# Patient Record
Sex: Female | Born: 1985 | Race: White | Hispanic: No | Marital: Married | Smoking: Former smoker
Health system: Southern US, Community
[De-identification: ages and names within clinical notes are randomized; demographics above are authoritative.]

## PROBLEM LIST (undated history)

## (undated) DIAGNOSIS — M5136 Other intervertebral disc degeneration, lumbar region: Secondary | ICD-10-CM

## (undated) DIAGNOSIS — M51369 Other intervertebral disc degeneration, lumbar region without mention of lumbar back pain or lower extremity pain: Secondary | ICD-10-CM

## (undated) DIAGNOSIS — G43909 Migraine, unspecified, not intractable, without status migrainosus: Secondary | ICD-10-CM

## (undated) DIAGNOSIS — M5030 Other cervical disc degeneration, unspecified cervical region: Secondary | ICD-10-CM

## (undated) DIAGNOSIS — M5020 Other cervical disc displacement, unspecified cervical region: Secondary | ICD-10-CM

## (undated) HISTORY — DX: Other cervical disc degeneration, unspecified cervical region: M50.30

## (undated) HISTORY — DX: Other cervical disc displacement, unspecified cervical region: M50.20

## (undated) HISTORY — DX: Other intervertebral disc degeneration, lumbar region: M51.36

## (undated) HISTORY — PX: APPENDECTOMY: SHX54

## (undated) HISTORY — DX: Other intervertebral disc degeneration, lumbar region without mention of lumbar back pain or lower extremity pain: M51.369

## (undated) HISTORY — PX: TONSILLECTOMY: SUR1361

## (undated) HISTORY — PX: TUBAL LIGATION: SHX77

## (undated) HISTORY — PX: TYMPANOSTOMY TUBE PLACEMENT: SHX32

## (undated) HISTORY — PX: OTHER SURGICAL HISTORY: SHX169

## (undated) HISTORY — PX: TONSILLECTOMY AND ADENOIDECTOMY: SHX28

---

## 2014-09-20 LAB — HM PAP SMEAR: HM PAP: NEGATIVE

## 2015-06-19 ENCOUNTER — Encounter (HOSPITAL_COMMUNITY): Payer: Self-pay | Admitting: Emergency Medicine

## 2015-06-19 ENCOUNTER — Emergency Department (HOSPITAL_COMMUNITY)
Admission: EM | Admit: 2015-06-19 | Discharge: 2015-06-19 | Disposition: A | Discharge status: Home / Self Care | Payer: BLUE CROSS/BLUE SHIELD | Attending: Emergency Medicine | Admitting: Emergency Medicine

## 2015-06-19 DIAGNOSIS — Z9049 Acquired absence of other specified parts of digestive tract: Secondary | ICD-10-CM | POA: Diagnosis not present

## 2015-06-19 DIAGNOSIS — R1012 Left upper quadrant pain: Secondary | ICD-10-CM | POA: Insufficient documentation

## 2015-06-19 DIAGNOSIS — Z720 Tobacco use: Secondary | ICD-10-CM | POA: Diagnosis not present

## 2015-06-19 DIAGNOSIS — Z3202 Encounter for pregnancy test, result negative: Secondary | ICD-10-CM | POA: Insufficient documentation

## 2015-06-19 LAB — URINALYSIS, ROUTINE W REFLEX MICROSCOPIC
GLUCOSE, UA: NEGATIVE mg/dL
Ketones, ur: NEGATIVE mg/dL
Nitrite: NEGATIVE
PH: 6.5 (ref 5.0–8.0)
Protein, ur: NEGATIVE mg/dL
Specific Gravity, Urine: 1.035 — ABNORMAL HIGH (ref 1.005–1.030)
UROBILINOGEN UA: 1 mg/dL (ref 0.0–1.0)

## 2015-06-19 LAB — COMPREHENSIVE METABOLIC PANEL
ALT: 21 U/L (ref 14–54)
ANION GAP: 9 (ref 5–15)
AST: 22 U/L (ref 15–41)
Albumin: 4.6 g/dL (ref 3.5–5.0)
Alkaline Phosphatase: 60 U/L (ref 38–126)
BILIRUBIN TOTAL: 0.4 mg/dL (ref 0.3–1.2)
BUN: 10 mg/dL (ref 6–20)
CALCIUM: 9.4 mg/dL (ref 8.9–10.3)
CHLORIDE: 102 mmol/L (ref 101–111)
CO2: 26 mmol/L (ref 22–32)
Creatinine, Ser: 0.72 mg/dL (ref 0.44–1.00)
GFR calc non Af Amer: >60 mL/min (ref >60)
GLUCOSE: 93 mg/dL (ref 65–99)
Potassium: 3.9 mmol/L (ref 3.5–5.1)
Sodium: 137 mmol/L (ref 135–145)
Total Protein: 7.6 g/dL (ref 6.5–8.1)

## 2015-06-19 LAB — URINE MICROSCOPIC-ADD ON

## 2015-06-19 LAB — CBC
HCT: 39.4 % (ref 36.0–46.0)
Hemoglobin: 13.1 g/dL (ref 12.0–15.0)
MCH: 29.8 pg (ref 26.0–34.0)
MCHC: 33.2 g/dL (ref 30.0–36.0)
MCV: 89.7 fL (ref 78.0–100.0)
Platelets: 268 10*3/uL (ref 150–400)
RBC: 4.39 MIL/uL (ref 3.87–5.11)
RDW: 12.6 % (ref 11.5–15.5)
WBC: 6.2 10*3/uL (ref 4.0–10.5)

## 2015-06-19 LAB — PREGNANCY, URINE: Preg Test, Ur: NEGATIVE

## 2015-06-19 LAB — LIPASE, BLOOD: Lipase: 21 U/L — ABNORMAL LOW (ref 22–51)

## 2015-06-19 NOTE — ED Notes (Signed)
Pt reports upper left abd pain for the last month but worse this week. Pt has loss of appetite and reports being full all the time. Pt states reports her period cycle has been off rhythm.

## 2015-06-19 NOTE — Discharge Instructions (Signed)
Abdominal Pain, Women °Abdominal (stomach, pelvic, or belly) pain can be caused by many things. It is important to tell your doctor: °· The location of the pain. °· Does it come and go or is it present all the time? °· Are there things that start the pain (eating certain foods, exercise)? °· Are there other symptoms associated with the pain (fever, nausea, vomiting, diarrhea)? °All of this is helpful to know when trying to find the cause of the pain. °CAUSES  °· Stomach: virus or bacteria infection, or ulcer. °· Intestine: appendicitis (inflamed appendix), regional ileitis (Crohn's disease), ulcerative colitis (inflamed colon), irritable bowel syndrome, diverticulitis (inflamed diverticulum of the colon), or cancer of the stomach or intestine. °· Gallbladder disease or stones in the gallbladder. °· Kidney disease, kidney stones, or infection. °· Pancreas infection or cancer. °· Fibromyalgia (pain disorder). °· Diseases of the female organs: °¨ Uterus: fibroid (non-cancerous) tumors or infection. °¨ Fallopian tubes: infection or tubal pregnancy. °¨ Ovary: cysts or tumors. °¨ Pelvic adhesions (scar tissue). °¨ Endometriosis (uterus lining tissue growing in the pelvis and on the pelvic organs). °¨ Pelvic congestion syndrome (female organs filling up with blood just before the menstrual period). °¨ Pain with the menstrual period. °¨ Pain with ovulation (producing an egg). °¨ Pain with an IUD (intrauterine device, birth control) in the uterus. °¨ Cancer of the female organs. °· Functional pain (pain not caused by a disease, may improve without treatment). °· Psychological pain. °· Depression. °DIAGNOSIS  °Your doctor will decide the seriousness of your pain by doing an examination. °· Blood tests. °· X-rays. °· Ultrasound. °· CT scan (computed tomography, special type of X-ray). °· MRI (magnetic resonance imaging). °· Cultures, for infection. °· Barium enema (dye inserted in the large intestine, to better view it with  X-rays). °· Colonoscopy (looking in intestine with a lighted tube). °· Laparoscopy (minor surgery, looking in abdomen with a lighted tube). °· Major abdominal exploratory surgery (looking in abdomen with a large incision). °TREATMENT  °The treatment will depend on the cause of the pain.  °· Many cases can be observed and treated at home. °· Over-the-counter medicines recommended by your caregiver. °· Prescription medicine. °· Antibiotics, for infection. °· Birth control pills, for painful periods or for ovulation pain. °· Hormone treatment, for endometriosis. °· Nerve blocking injections. °· Physical therapy. °· Antidepressants. °· Counseling with a psychologist or psychiatrist. °· Minor or major surgery. °HOME CARE INSTRUCTIONS  °· Do not take laxatives, unless directed by your caregiver. °· Take over-the-counter pain medicine only if ordered by your caregiver. Do not take aspirin because it can cause an upset stomach or bleeding. °· Try a clear liquid diet (broth or water) as ordered by your caregiver. Slowly move to a bland diet, as tolerated, if the pain is related to the stomach or intestine. °· Have a thermometer and take your temperature several times a day, and record it. °· Bed rest and sleep, if it helps the pain. °· Avoid sexual intercourse, if it causes pain. °· Avoid stressful situations. °· Keep your follow-up appointments and tests, as your caregiver orders. °· If the pain does not go away with medicine or surgery, you may try: °¨ Acupuncture. °¨ Relaxation exercises (yoga, meditation). °¨ Group therapy. °¨ Counseling. °SEEK MEDICAL CARE IF:  °· You notice certain foods cause stomach pain. °· Your home care treatment is not helping your pain. °· You need stronger pain medicine. °· You want your IUD removed. °· You feel faint or   lightheaded. °· You develop nausea and vomiting. °· You develop a rash. °· You are having side effects or an allergy to your medicine. °SEEK IMMEDIATE MEDICAL CARE IF:  °· Your  pain does not go away or gets worse. °· You have a fever. °· Your pain is felt only in portions of the abdomen. The right side could possibly be appendicitis. The left lower portion of the abdomen could be colitis or diverticulitis. °· You are passing blood in your stools (bright red or black tarry stools, with or without vomiting). °· You have blood in your urine. °· You develop chills, with or without a fever. °· You pass out. °MAKE SURE YOU:  °· Understand these instructions. °· Will watch your condition. °· Will get help right away if you are not doing well or get worse. °Document Released: 09/21/2007 Document Revised: 04/10/2014 Document Reviewed: 10/11/2009 °ExitCare® Patient Information ©2015 [employer]. This information is not intended to replace advice given to you by your health care provider. Make sure you discuss any questions you have with your health care provider. ° °Emergency Department Resource Guide °1) Find a Doctor and Pay Out of Pocket °Although you won't have to find out who is covered by your insurance plan, it is a good idea to ask around and get recommendations. You will then need to call the office and see if the doctor you have chosen will accept you as a new patient and what types of options they offer for patients who are self-pay. Some doctors offer discounts or will set up payment plans for their patients who do not have insurance, but you will need to ask so you aren't surprised when you get to your appointment. ° °2) Contact Your Local Health Department °Not all health departments have doctors that can see patients for sick visits, but many do, so it is worth a call to see if yours does. If you don't know where your local health department is, you can check in your phone book. The CDC also has a tool to help you locate your state's health department, and many state websites also have listings of all of their local health departments. ° °3) Find a Walk-in Clinic °If your illness is  not likely to be very severe or complicated, you may want to try a walk in clinic. These are popping up all over the country in pharmacies, drugstores, and shopping centers. They're usually staffed by nurse practitioners or physician assistants that have been trained to treat common illnesses and complaints. They're usually fairly quick and inexpensive. However, if you have serious medical issues or chronic medical problems, these are probably not your best option. ° °No Primary Care Doctor: °- Call [facility] at  832-8000 - they can help you locate a primary care doctor that  accepts your insurance, provides certain services, etc. °- Physician Referral Service- 1-800-533-3463 ° °Chronic Pain Problems: °Organization         Address  Phone   Notes  °[facility]  (336) 297-2271 Patients need to be referred by their primary care doctor.  ° °Medication Assistance: °Organization         Address  Phone   Notes  °[place] [facility] [address] °[place], [place] [address] (336) 641-8030 --Must be a resident of [place] °-- Must have NO insurance coverage whatsoever (no Medicaid/ Medicare, etc.) °-- The pt. MUST have a primary care doctor that directs their care regularly   and follows them in the community °  °[facility]  (866) 331-1348   °[employer]  (888) 892-1162   ° °Agencies that provide inexpensive medical care: °Organization         Address  Phone   Notes  °[facility]  (336) 832-8035   °[facility]    (336) 832-7272   °[facility] [address] °[place], [place] [address] (336) 832-4777   °[facility] [address], °[place] (336) 271-4999   °[facility]    (336) 373-0678   °[facility]    (336) 272-1050   °[facility] ° [address], [place] Phone:  (336) 832-4444, Fax:  (336) 832-4440 Hours of Operation:  9 am - 6  pm, M-F.  Also accepts Medicaid/Medicare and self-pay.  ° [facility] ° [address], [place] Phone: (336) 832-3150, Fax: (336) 832-3151. Hours of Operation:  8:30 am - 5:30 pm, M-F.  Also accepts Medicaid and self-pay.  °[facility] [place] [address], [place] Phone: (336) 878-6027   °[facility] [address], [place], [place] (336)723-1848, Ext. 123 Mondays & Thursdays: 7-9 AM.  First 15 patients are seen on a first come, first serve basis. °  ° °Medicaid-accepting [place] Providers: ° °Organization         Address  Phone   Notes  °[facility] [address], [place] (336) 641-2100 Also accepts self-pay patients.  °[facility] [address], [place] ° (336) 856-9996   °[facility] [address], [place] (336) 288-8857   °[facility] [address] [place] [address], [place] (336) 299-7000   °Veita Bland [address], [place]  ° (336) 373-1557 Only accepts [place] Access Medicaid patients after they have their name applied to their card.  ° °Self-Pay (no insurance) in [place]: ° °Organization         Address  Phone   Notes  °Sickle Cell Patients, [facility] [address], [place] (336) 832-1970   °[facility] [address], [place] (336) 832-4400   °[facility] ° [address], [place] (336) 992-4800   °[facility]/Dr. Osei-Bonsu ° [address] [place] [address], [place] or [address], [place] (336) 841-8500 Phone number for both [place] and [place] locations is the same.  °[facility] [address], [place] (336) 299-0000   °[facility] [address] [place] [address], [place] or [address] (336) 852-7530 °(336) 878-2260   °[facility] [address], [place] (336) 350-1642, phone; (336) 294-5005, fax Sees patients 1st and 3rd Saturday of every month.  Must not qualify for public or private insurance (i.e. Medicaid, Medicare, [place] Health Choice, Veterans' Benefits) • Household income should be no more than 200% of the poverty level •The clinic cannot treat you if you are pregnant or think you are pregnant • Sexually transmitted diseases are not treated at the clinic.  ° ° °Dental Care: °Organization         Address  Phone  Notes  °[facility] [address], [place] (336) 641-6152 Accepts children up to age 21 who   are enrolled in Medicaid or [place] Health Choice; pregnant women with a Medicaid card; and children who have applied for Medicaid or [place] Health Choice, but were declined, whose parents can pay a reduced fee at time of service.  °[facility] [place]  [address], [place] (336) 641-7733 Accepts children up to age 21 who are enrolled in Medicaid or [place] Health Choice; pregnant women with a Medicaid card; and children who have applied for Medicaid or [place] Health Choice, but were declined, whose parents can pay a reduced fee at time of service.  °[facility] ° [address], [place] (336) 641-4533 Patients are seen by appointment only. Walk-ins are not accepted. [facility] will see patients 18 years of age and older. °Monday - Tuesday (8am-5pm) °Most Wednesdays (8:30-5pm) °$30 per visit, cash only  °[facility] ° [address], [place] (336) 641-4533 Patients are seen by appointment only. Walk-ins are not accepted. [facility] will see patients 18 years of age and older. °One Wednesday Evening (Monthly: Volunteer Based).  $30 per visit, cash only  °[facility]  (919) 537-3737 for adults; Children under age 4, call Graduate Pediatric Dentistry at (919)  [phone]. Children aged 4-14, please call (919) [phone] to request a pediatric application. ° Dental services are provided in all areas of dental care including fillings, crowns and bridges, complete and partial dentures, implants, gum treatment, root canals, and extractions. Preventive care is also provided. Treatment is provided to both adults and children. °Patients are selected via a lottery and there is often a waiting list. °  °[facility] [address], °[place] ° (336) 763-8833 [email or web address] °  °[facility] [address], [place], [place] (336)723-1848, Ext. 123 Second and Fourth Thursday of each month, opens at 6:30 AM; Clinic ends at 9 AM.  Patients are seen on a first-come first-served basis, and a limited number are seen during each clinic.  ° °[facility] ° [address], [place], [place] (336) 723-7904   Eligibility Requirements °You must have lived in [place], [place], or [place] counties for at least the last three months. °  You cannot be eligible for state or federal sponsored healthcare insurance, including Veterans Administration, Medicaid, or Medicare. °  You generally cannot be eligible for healthcare insurance through your employer.  °  How to apply: °Eligibility screenings are held every Tuesday and Wednesday afternoon from 1:00 pm until 4:00 pm. You do not need an appointment for the interview!  °[facility] [address], [place], [place] 336-631-2330   °[facility] [facility]  336-342-8273   °[facility]  336-703-3100   °[facility]  336-570-6415   ° °Behavioral Health Resources in the Community: °Intensive Outpatient Programs °Organization         Address  Phone  Notes  °[facility] [address], [place], [place] 336-878-6098   °[facility] [address], [place], [place] 336-832-9800   °[facility]  [address], [place], [place] ° 336-882-2125   °[facility] [address],  °[place], [place] 1-800-853-5163 or 336-641-4981   °Substance Abuse Resources °Organization         Address  Phone  Notes  °[facility]  336-882-2125   °[facility]  336-784-9470   °  [facility]  336-285-9073   °[facility]  336-845-3988   °Residential & Outpatient Substance Abuse Program  1-800-659-3381   °Psychological Services °Organization         Address  Phone  Notes  °[facility]  336- 832-9600   °[facility]  336- 378-7881   °[facility] [address], [place] 1-800-853-5163 or 336-641-4981   ° °Mobile Crisis Teams °Organization         Address  Phone  Notes  °[facility], Mobile Crisis Care Unit  1-877-626-1772   °Assertive °[facility] ° [address] [place], [place] 336-834-9664   °Sharon DeEsch [address] °[place] 336-554-5454   ° °Self-Help/Support Groups °Organization         Address  Phone             Notes  °[facility] - variety of support groups  336- 373-1402 Call for more information  °Narcotics Anonymous (NA), [facility] [address], °[place]  2 meetings at this location  ° °Residential Treatment Programs °Organization         Address  Phone  Notes  °[facility] [address],    °[place]  1-866-801-8205   °[facility] ° [address], [place], [place] 704-293-8524   °[facility] [address], [place] 336-845-3988 Admissions: 8am-3pm M-F  °[facility] [address]    °[place], [place] 336-841-1104   °[facility] [address], [place], [place] 336-379-7146   °[facility] [address]  °[place], [place] 336-285-9073   °[facility] - Intensive Outpatient [address], [place], [place]  336-852-3033   °[facility] [address]  °[place], [place] 1-877-615-2722 or 336-784-9470   °[facility] [address] [place], [place] 336-227-7417 Accepts Medicaid  °[facility] [address]  ° [place] 1-800-659-3381 Substance Abuse/Addiction Treatment  ° °[place] Behavioral Health Resources °Organization         Address  Phone  Notes  °[facility]  (888) 581-9988   °Julie Brannon, PhD [address] [place], [place]   (336) 349-5553 or (336) 951-0000   °[facility]   [address] °[place], [place] (336) 349-4454   °[facility] [address], [place], [place] (336) 342-8316 Insurance/Medicaid/sponsorship through [facility]  °[facility] [address]                                    [place], [place] (336) 342-8316 Therapy/tele-psych/case  °[facility] [address]  ° [place], [place] (336) 349-2233    °Dr. Arfeen  (336) 349-4544   °[facility] [place]  [employer] [place] [facility] 1) [address], [place] °2) [address], [place] °3)  [address], [place] (336) 349-3220 °(336) 342-7768 ° °(336) 342-8140   °[place] Child Abuse [facility] (336) 342-1394 or (336) 342-3537 (After Hours)    ° ° °

## 2015-06-19 NOTE — ED Notes (Signed)
Pt states that x 6 months she has had LUQ pain and decreased appetite. Has been seen at [facility] w/o finding out what the prob is. Alert and oriented.

## 2015-06-19 NOTE — ED Provider Notes (Signed)
CSN: [id number]     Arrival date & time 06/19/15  1845 History   First MD Initiated Contact with Patient 06/19/15 2056     Chief Complaint  Patient presents with  . Abdominal Pain     (Consider location/radiation/quality/duration/timing/severity/associated sxs/prior Treatment) HPI Comments: Pt comes in with c/o luq abdominal pain that has been going on for 1 month.denies fever, vomiting or diarrhea. Pt states that she went to [facility] and they didn't find anything. She states that the symptoms have gotten worse int he last week. She state that her period has not been coming consistently like in the past. Sh is currently on her menstrual cycle. She has not had a cough. She states that she doesn't have an appetite. she states that her pcp made an appointment with gi but she missed it and they couldn't see her again for 2 months.  The history is provided by the patient. No language interpreter was used.    History reviewed. No pertinent past medical history. Past Surgical History  Procedure Laterality Date  . Bulging disc      cervical  . Appendectomy    . Tympanostomy tube placement     History reviewed. No pertinent family history. History  Substance Use Topics  . Smoking status: Current Every Day Smoker  . Smokeless tobacco: Not on file  . Alcohol Use: Yes     Comment: social   OB History    No data available     Review of Systems  All other systems reviewed and are negative.     Allergies  Review of patient's allergies indicates no known allergies.  Home Medications   Prior to Admission medications   Medication Sig Start Date End Date Taking? Authorizing Provider  acetaminophen (TYLENOL) 500 MG tablet Take 1,000 mg by mouth every 6 (six) hours as needed for moderate pain.   Yes Historical Provider, MD  ibuprofen (ADVIL,MOTRIN) 200 MG tablet Take 800 mg by mouth every 6 (six) hours as needed for moderate pain.   Yes Historical Provider, MD   Ibuprofen-Diphenhydramine HCl (ADVIL PM) 200-25 MG CAPS Take 1 tablet by mouth at bedtime as needed (sleep).   Yes Historical Provider, MD  naproxen sodium (ANAPROX) 220 MG tablet Take 220 mg by mouth 2 (two) times daily as needed (pain).   Yes Historical Provider, MD   BP 126/79 mmHg  Pulse 80  Temp(Src) 98.3 F (36.8 C) (Oral)  SpO2 100%  LMP 06/18/2015 Physical Exam  Constitutional: She appears well-developed and well-nourished.  HENT:  Head: Normocephalic and atraumatic.  Cardiovascular: Normal rate and regular rhythm.   Pulmonary/Chest: Effort normal and breath sounds normal.  Abdominal: Soft. Bowel sounds are normal. There is no tenderness.  Musculoskeletal: Normal range of motion.  Neurological: She is alert. She exhibits normal muscle tone. Coordination normal.  Skin: Skin is warm and dry.  Psychiatric: She has a normal mood and affect.  Nursing note and vitals reviewed.   ED Course  Procedures (including critical care time) Labs Review Labs Reviewed  LIPASE, BLOOD - Abnormal; Notable for the following:    Lipase 21 (*)    All other components within normal limits  URINALYSIS, ROUTINE W REFLEX MICROSCOPIC (NOT AT [facility]) - Abnormal; Notable for the following:    Color, Urine AMBER (*)    APPearance CLOUDY (*)    Specific Gravity, Urine 1.035 (*)    Hgb urine dipstick LARGE (*)    Bilirubin Urine SMALL (*)    Leukocytes,  UA TRACE (*)    All other components within normal limits  COMPREHENSIVE METABOLIC PANEL  CBC  PREGNANCY, URINE  URINE MICROSCOPIC-ADD ON    Imaging Review No results found.   EKG Interpretation None      MDM   Final diagnoses:  Left upper quadrant pain   Abdomen no acute on exam. With symptoms going on for 6 months. Think that pt is okay to follow up with gi.. Hematuria noted but pt is on period at this time    Teressa LowerVrinda Trenden Hazelrigg, NP 06/19/15 [id number]  Benjiman CoreNathan Fernando Stoiber, MD 06/20/15 [phone]

## 2015-06-29 ENCOUNTER — Encounter (HOSPITAL_COMMUNITY): Payer: Self-pay

## 2015-06-29 ENCOUNTER — Emergency Department (HOSPITAL_COMMUNITY)
Admission: EM | Admit: 2015-06-29 | Discharge: 2015-06-29 | Disposition: A | Discharge status: Home / Self Care | Payer: BLUE CROSS/BLUE SHIELD | Attending: Emergency Medicine | Admitting: Emergency Medicine

## 2015-06-29 DIAGNOSIS — R1013 Epigastric pain: Secondary | ICD-10-CM | POA: Diagnosis not present

## 2015-06-29 DIAGNOSIS — Z9889 Other specified postprocedural states: Secondary | ICD-10-CM | POA: Insufficient documentation

## 2015-06-29 DIAGNOSIS — R110 Nausea: Secondary | ICD-10-CM | POA: Diagnosis not present

## 2015-06-29 DIAGNOSIS — Z79899 Other long term (current) drug therapy: Secondary | ICD-10-CM | POA: Diagnosis not present

## 2015-06-29 DIAGNOSIS — R1012 Left upper quadrant pain: Secondary | ICD-10-CM | POA: Diagnosis not present

## 2015-06-29 DIAGNOSIS — G8929 Other chronic pain: Secondary | ICD-10-CM | POA: Diagnosis not present

## 2015-06-29 DIAGNOSIS — Z3202 Encounter for pregnancy test, result negative: Secondary | ICD-10-CM | POA: Insufficient documentation

## 2015-06-29 DIAGNOSIS — R109 Unspecified abdominal pain: Secondary | ICD-10-CM | POA: Diagnosis present

## 2015-06-29 DIAGNOSIS — Z720 Tobacco use: Secondary | ICD-10-CM | POA: Insufficient documentation

## 2015-06-29 LAB — CBC
HCT: 40.6 % (ref 36.0–46.0)
HEMOGLOBIN: 14.1 g/dL (ref 12.0–15.0)
MCH: 30.1 pg (ref 26.0–34.0)
MCHC: 34.7 g/dL (ref 30.0–36.0)
MCV: 86.6 fL (ref 78.0–100.0)
Platelets: 242 10*3/uL (ref 150–400)
RBC: 4.69 MIL/uL (ref 3.87–5.11)
RDW: 12.2 % (ref 11.5–15.5)
WBC: 5.1 10*3/uL (ref 4.0–10.5)

## 2015-06-29 LAB — URINE MICROSCOPIC-ADD ON

## 2015-06-29 LAB — COMPREHENSIVE METABOLIC PANEL
ALBUMIN: 4.6 g/dL (ref 3.5–5.0)
ALK PHOS: 61 U/L (ref 38–126)
ALT: 25 U/L (ref 14–54)
AST: 25 U/L (ref 15–41)
Anion gap: 8 (ref 5–15)
BILIRUBIN TOTAL: 0.6 mg/dL (ref 0.3–1.2)
BUN: 10 mg/dL (ref 6–20)
CHLORIDE: 106 mmol/L (ref 101–111)
CO2: 22 mmol/L (ref 22–32)
Calcium: 9.5 mg/dL (ref 8.9–10.3)
Creatinine, Ser: 0.73 mg/dL (ref 0.44–1.00)
GFR calc Af Amer: >60 mL/min (ref >60)
Glucose, Bld: 91 mg/dL (ref 65–99)
Potassium: 3.8 mmol/L (ref 3.5–5.1)
Sodium: 136 mmol/L (ref 135–145)
Total Protein: 7.7 g/dL (ref 6.5–8.1)

## 2015-06-29 LAB — URINALYSIS, ROUTINE W REFLEX MICROSCOPIC
BILIRUBIN URINE: NEGATIVE
Glucose, UA: NEGATIVE mg/dL
KETONES UR: NEGATIVE mg/dL
NITRITE: NEGATIVE
PH: 8 (ref 5.0–8.0)
Protein, ur: NEGATIVE mg/dL
Specific Gravity, Urine: 1.007 (ref 1.005–1.030)
Urobilinogen, UA: 0.2 mg/dL (ref 0.0–1.0)

## 2015-06-29 LAB — LIPASE, BLOOD: Lipase: 20 U/L — ABNORMAL LOW (ref 22–51)

## 2015-06-29 LAB — PREGNANCY, URINE: Preg Test, Ur: NEGATIVE

## 2015-06-29 MED ORDER — HYDROCODONE-ACETAMINOPHEN 5-325 MG PO TABS: 2.0000 | ORAL_TABLET | ORAL | Status: DC | PRN

## 2015-06-29 MED ORDER — PANTOPRAZOLE SODIUM 20 MG PO TBEC: 20.0000 mg | DELAYED_RELEASE_TABLET | Freq: Every day | ORAL | Status: DC

## 2015-06-29 MED ORDER — ONDANSETRON HCL 4 MG/2ML IJ SOLN
4.0000 mg | Freq: Once | INTRAMUSCULAR | Status: AC
  Administered 2015-06-29: 4 mg via INTRAVENOUS
  Filled 2015-06-29: qty 2

## 2015-06-29 MED ORDER — MORPHINE SULFATE 4 MG/ML IJ SOLN: 4.0000 mg | Freq: Once | INTRAMUSCULAR | Status: DC

## 2015-06-29 MED ORDER — MORPHINE SULFATE 4 MG/ML IJ SOLN
4.0000 mg | Freq: Once | INTRAMUSCULAR | Status: AC
  Administered 2015-06-29: 4 mg via INTRAVENOUS
  Filled 2015-06-29: qty 1

## 2015-06-29 MED ORDER — SODIUM CHLORIDE 0.9 % IV BOLUS (SEPSIS)
1000.0000 mL | Freq: Once | INTRAVENOUS | Status: AC
Start: 2015-06-29 — End: 2015-06-29
  Administered 2015-06-29: 1000 mL via INTRAVENOUS

## 2015-06-29 NOTE — ED Notes (Signed)
Patient reports that she was suppose to have an endoscopy on 07/24/15, but now has increased pain left flank that radiates to the left lower back and left shoulder. Patient called [facility] and was told to come to the ED. Patient c/o nausea.

## 2015-06-29 NOTE — ED Provider Notes (Signed)
CSN: [id number]     Arrival date & time 06/29/15  1046 History   First MD Initiated Contact with Patient 06/29/15 1106     Chief Complaint  Patient presents with  . Flank Pain     (Consider location/radiation/quality/duration/timing/severity/associated sxs/prior Treatment) Patient is a 29 y.o. female presenting with flank pain. The history is provided by the patient. No language interpreter was used.  Flank Pain Associated symptoms include abdominal pain and nausea. Pertinent negatives include no chills, fever or vomiting.  Ms. Reitter is a 29 y.o female who presents with worsening and constant left flank pain and abdominal pain for the past 9 months but has not been able to sleep the past 2 days.  Pain radiates into the left shoulder. She states she is nauseated but has not vomited.  Pain is worse after eating. She took 2 Tylenol PMs last night. She was seen by [facility] 2 days ago and has a scheduled endoscopy on 07/09/2015. She denies any fever, chills, chest pain, shortness of breath, diarrhea, constipation, dysuria, hematuria. LMP 2 weeks ago.  History reviewed. No pertinent past medical history. Past Surgical History  Procedure Laterality Date  . Bulging disc      cervical  . Appendectomy    . Tympanostomy tube placement     Family History  Problem Relation Age of Onset  . Cirrhosis Mother   . Heart failure Father   . Cancer Sister    History  Substance Use Topics  . Smoking status: Current Every Day Smoker -- 0.50 packs/day    Types: Cigarettes  . Smokeless tobacco: Never Used  . Alcohol Use: Yes     Comment: social   OB History    No data available     Review of Systems  Constitutional: Negative for fever and chills.  Gastrointestinal: Positive for nausea and abdominal pain. Negative for vomiting, diarrhea and constipation.  Genitourinary: Positive for flank pain.  All other systems reviewed and are negative.     Allergies  Review of patient's allergies  indicates no known allergies.  Home Medications   Prior to Admission medications   Medication Sig Start Date End Date Taking? Authorizing Provider  acetaminophen (TYLENOL) 500 MG tablet Take 1,000 mg by mouth every 6 (six) hours as needed for moderate pain.   Yes Historical Provider, MD  ibuprofen (ADVIL,MOTRIN) 200 MG tablet Take 800 mg by mouth every 6 (six) hours as needed for moderate pain.   Yes Historical Provider, MD  Ibuprofen-Diphenhydramine HCl (ADVIL PM) 200-25 MG CAPS Take 1 tablet by mouth at bedtime as needed (sleep).   Yes Historical Provider, MD  naproxen sodium (ANAPROX) 220 MG tablet Take 220 mg by mouth 2 (two) times daily as needed (pain).   Yes Historical Provider, MD  HYDROcodone-acetaminophen (NORCO/VICODIN) 5-325 MG per tablet Take 2 tablets by mouth every 4 (four) hours as needed. 06/29/15   Chessie Neuharth Patel-Mills, PA-C  pantoprazole (PROTONIX) 20 MG tablet Take 1 tablet (20 mg total) by mouth daily. 06/29/15   Keyra Virella Patel-Mills, PA-C   BP 100/62 mmHg  Pulse 73  Temp(Src) 97.7 F (36.5 C) (Oral)  Resp 16  Ht  (1.702 m)  Wt 196 lb (88.905 kg)  BMI 30.69 kg/m2  SpO2 100%  LMP 06/18/2015 Physical Exam  Constitutional: She is oriented to person, place, and time. She appears well-developed and well-nourished.  HENT:  Head: Normocephalic and atraumatic.  Eyes: Conjunctivae are normal.  Neck: Normal range of motion. Neck supple.  Cardiovascular:  Normal rate, regular rhythm and normal heart sounds.   Pulmonary/Chest: Effort normal and breath sounds normal.  Abdominal: Soft. She exhibits no distension and no mass. There is tenderness in the epigastric area and left upper quadrant. There is no rigidity, no rebound and no guarding.     Left CVA tenderness to palpation.   Musculoskeletal: Normal range of motion.  Neurological: She is alert and oriented to person, place, and time.  Skin: Skin is warm and dry.  Nursing note and vitals reviewed.   ED Course    Procedures (including critical care time) Labs Review Labs Reviewed  LIPASE, BLOOD - Abnormal; Notable for the following:    Lipase 20 (*)    All other components within normal limits  URINALYSIS, ROUTINE W REFLEX MICROSCOPIC (NOT AT [facility]) - Abnormal; Notable for the following:    APPearance CLOUDY (*)    Hgb urine dipstick TRACE (*)    Leukocytes, UA LARGE (*)    All other components within normal limits  URINE MICROSCOPIC-ADD ON - Abnormal; Notable for the following:    Squamous Epithelial / LPF MANY (*)    Bacteria, UA MANY (*)    All other components within normal limits  COMPREHENSIVE METABOLIC PANEL  CBC  PREGNANCY, URINE    Imaging Review No results found.   EKG Interpretation None      MDM   Final diagnoses:  Abdominal pain, chronic, epigastric  Patient presents for chronic LUQ abdominal pain and flank pain for 9 months but worse in the past 2 days.  She is followed closely by GI and has an endoscopy scheduled for next month. Her vitals are stable and she is well appearing. Her labs are unremarkable and she does not have a UTI. I discussed keeping her follow up GI appointment and possibly following up earlier regarding her chronic pain.  I explained that she could take tylenol for pain and gave her 6 hydrocodone for breakthrough pain. I also discussed that she would need to see her pcp since she stated that the pain medication would only get her through the next couple of nights.  I discussed return precautions and patient is frustrated that that she still does not have an answer from GI regarding the cause of her symptoms but agrees with the plan. I reassured her that I did not believe it was life threatening.      Catha Gosselin, PA-C 06/29/15 1739  Bethann Berkshire, MD 06/30/15 [phone]

## 2015-06-29 NOTE — ED Notes (Signed)
Nurse currently starting IV 

## 2015-06-29 NOTE — Discharge Instructions (Signed)
FOLLOW UP WITH GASTROENTEROLOGY.  TAKE TYLENOL FOR PAIN AND HYDROCODONE FOR BREAKTHROUGH PAIN.

## 2015-07-16 ENCOUNTER — Emergency Department (HOSPITAL_COMMUNITY): Payer: BLUE CROSS/BLUE SHIELD

## 2015-07-16 ENCOUNTER — Emergency Department (HOSPITAL_COMMUNITY)
Admission: EM | Admit: 2015-07-16 | Discharge: 2015-07-17 | Disposition: A | Discharge status: Home / Self Care | Payer: BLUE CROSS/BLUE SHIELD | Attending: Emergency Medicine | Admitting: Emergency Medicine

## 2015-07-16 ENCOUNTER — Encounter (HOSPITAL_COMMUNITY): Payer: Self-pay | Admitting: Emergency Medicine

## 2015-07-16 DIAGNOSIS — Y9301 Activity, walking, marching and hiking: Secondary | ICD-10-CM | POA: Insufficient documentation

## 2015-07-16 DIAGNOSIS — Z720 Tobacco use: Secondary | ICD-10-CM | POA: Insufficient documentation

## 2015-07-16 DIAGNOSIS — R55 Syncope and collapse: Secondary | ICD-10-CM | POA: Diagnosis not present

## 2015-07-16 DIAGNOSIS — Z79899 Other long term (current) drug therapy: Secondary | ICD-10-CM | POA: Insufficient documentation

## 2015-07-16 DIAGNOSIS — Y998 Other external cause status: Secondary | ICD-10-CM | POA: Insufficient documentation

## 2015-07-16 DIAGNOSIS — Y9289 Other specified places as the place of occurrence of the external cause: Secondary | ICD-10-CM | POA: Insufficient documentation

## 2015-07-16 DIAGNOSIS — S0990XA Unspecified injury of head, initial encounter: Secondary | ICD-10-CM | POA: Diagnosis present

## 2015-07-16 DIAGNOSIS — R202 Paresthesia of skin: Secondary | ICD-10-CM | POA: Insufficient documentation

## 2015-07-16 DIAGNOSIS — W1839XA Other fall on same level, initial encounter: Secondary | ICD-10-CM | POA: Diagnosis not present

## 2015-07-16 DIAGNOSIS — Z8679 Personal history of other diseases of the circulatory system: Secondary | ICD-10-CM | POA: Insufficient documentation

## 2015-07-16 DIAGNOSIS — R51 Headache: Secondary | ICD-10-CM

## 2015-07-16 DIAGNOSIS — R519 Headache, unspecified: Secondary | ICD-10-CM

## 2015-07-16 HISTORY — DX: Migraine, unspecified, not intractable, without status migrainosus: G43.909

## 2015-07-16 LAB — BASIC METABOLIC PANEL
Anion gap: 7 (ref 5–15)
BUN: 11 mg/dL (ref 6–20)
CO2: 24 mmol/L (ref 22–32)
Calcium: 9.6 mg/dL (ref 8.9–10.3)
Chloride: 105 mmol/L (ref 101–111)
Creatinine, Ser: 0.76 mg/dL (ref 0.44–1.00)
GFR calc Af Amer: >60 mL/min (ref >60)
GFR calc non Af Amer: >60 mL/min (ref >60)
Glucose, Bld: 92 mg/dL (ref 65–99)
Potassium: 3.8 mmol/L (ref 3.5–5.1)
Sodium: 136 mmol/L (ref 135–145)

## 2015-07-16 LAB — URINALYSIS, ROUTINE W REFLEX MICROSCOPIC
BILIRUBIN URINE: NEGATIVE
Glucose, UA: NEGATIVE mg/dL
Ketones, ur: NEGATIVE mg/dL
Nitrite: NEGATIVE
PH: 7.5 (ref 5.0–8.0)
PROTEIN: NEGATIVE mg/dL
Specific Gravity, Urine: 1.017 (ref 1.005–1.030)
UROBILINOGEN UA: 1 mg/dL (ref 0.0–1.0)

## 2015-07-16 LAB — CBC
HCT: 39.5 % (ref 36.0–46.0)
Hemoglobin: 13.8 g/dL (ref 12.0–15.0)
MCH: 30.5 pg (ref 26.0–34.0)
MCHC: 34.9 g/dL (ref 30.0–36.0)
MCV: 87.4 fL (ref 78.0–100.0)
Platelets: 234 10*3/uL (ref 150–400)
RBC: 4.52 MIL/uL (ref 3.87–5.11)
RDW: 12.6 % (ref 11.5–15.5)
WBC: 6.9 10*3/uL (ref 4.0–10.5)

## 2015-07-16 LAB — URINE MICROSCOPIC-ADD ON

## 2015-07-16 LAB — POC URINE PREG, ED: PREG TEST UR: NEGATIVE

## 2015-07-16 LAB — CBG MONITORING, ED: Glucose-Capillary: 88 mg/dL (ref 65–99)

## 2015-07-16 MED ORDER — ONDANSETRON 4 MG PO TBDP
ORAL_TABLET | ORAL | Status: AC
  Administered 2015-07-16: 4 mg via ORAL
  Filled 2015-07-16: qty 1

## 2015-07-16 MED ORDER — OXYCODONE-ACETAMINOPHEN 5-325 MG PO TABS
1.0000 | ORAL_TABLET | Freq: Once | ORAL | Status: AC
  Administered 2015-07-16: 1 via ORAL

## 2015-07-16 MED ORDER — PROCHLORPERAZINE EDISYLATE 5 MG/ML IJ SOLN
10.0000 mg | Freq: Once | INTRAMUSCULAR | Status: AC
  Administered 2015-07-16: 10 mg via INTRAVENOUS
  Filled 2015-07-16: qty 2

## 2015-07-16 MED ORDER — DIPHENHYDRAMINE HCL 50 MG/ML IJ SOLN
25.0000 mg | Freq: Once | INTRAMUSCULAR | Status: AC
  Administered 2015-07-16: 25 mg via INTRAVENOUS
  Filled 2015-07-16: qty 1

## 2015-07-16 MED ORDER — ONDANSETRON 4 MG PO TBDP
4.0000 mg | ORAL_TABLET | Freq: Once | ORAL | Status: AC | PRN
  Administered 2015-07-16: 4 mg via ORAL

## 2015-07-16 MED ORDER — OXYCODONE-ACETAMINOPHEN 5-325 MG PO TABS
ORAL_TABLET | ORAL | Status: AC
  Administered 2015-07-16: 1 via ORAL
  Filled 2015-07-16: qty 1

## 2015-07-16 NOTE — ED Notes (Signed)
Onset 2-3 days ago intermittent headache and today developed increased headache and syncope for couple of seconds per husband at bedside. States nausea denies emesis. Alert answering and following commands appropriate. Pain currently 7/10 throbbing headache.

## 2015-07-16 NOTE — ED Notes (Addendum)
Dr. Wentz at bedside. 

## 2015-07-16 NOTE — ED Provider Notes (Signed)
CSN: [id number]     Arrival date & time 07/16/15  1953 History   First MD Initiated Contact with Patient 07/16/15 2227     Chief Complaint  Patient presents with  . Headache  . Loss of Consciousness     (Consider location/radiation/quality/duration/timing/severity/associated sxs/prior Treatment) HPI   Alantra Popoca is a 29 y.o. female who is here for evaluation of headache, and syncope. She has a headache that started earlier today, then tonight before dinner time, she was walking upstairs, had sudden worsening of her headache, then passed out. She landed on the floor, but did not injure herself. Her husband heard her fall, went to her side, and she was awake, but somewhat out of it for a few seconds. After the fall, she noticed some tingling in both hands and feet, which has improved to only tingling in both feet at this time. She is not sure if her head was injured in the fall. She denies blurred vision, nausea, vomiting, chest pain or shortness of breath. She denies being under stress. She was evaluated a week ago with endoscopy for ongoing left flank and abdominal pain. She was told the endoscopy was normal and that she should follow-up with the GI physician, in a week or 2. She has a history of migraine headaches. She has not had a headache in the last several days. There are no other known modifying factors.   Past Medical History  Diagnosis Date  . Migraine    Past Surgical History  Procedure Laterality Date  . Bulging disc      cervical  . Appendectomy    . Tympanostomy tube placement     Family History  Problem Relation Age of Onset  . Cirrhosis Mother   . Heart failure Father   . Cancer Sister    Social History  Substance Use Topics  . Smoking status: Current Every Day Smoker -- 0.50 packs/day    Types: Cigarettes  . Smokeless tobacco: Never Used  . Alcohol Use: Yes     Comment: social   OB History    No data available     Review of Systems  All other systems  reviewed and are negative.     Allergies  Review of patient's allergies indicates no known allergies.  Home Medications   Prior to Admission medications   Medication Sig Start Date End Date Taking? Authorizing Provider  acetaminophen (TYLENOL) 500 MG tablet Take 1,000 mg by mouth every 6 (six) hours as needed for moderate pain.   Yes Historical Provider, MD  ibuprofen (ADVIL,MOTRIN) 200 MG tablet Take 800 mg by mouth every 6 (six) hours as needed for moderate pain.   Yes Historical Provider, MD  Ibuprofen-Diphenhydramine HCl (ADVIL PM) 200-25 MG CAPS Take 1 tablet by mouth at bedtime as needed (sleep).   Yes Historical Provider, MD  pantoprazole (PROTONIX) 20 MG tablet Take 1 tablet (20 mg total) by mouth daily. 06/29/15  Yes Hanna Patel-Mills, PA-C   BP 96/63 mmHg  Pulse 64  Temp(Src) 98.8 F (37.1 C) (Oral)  Resp 16  Ht 5\' 7"  (1.702 m)  Wt 192 lb (87.091 kg)  BMI 30.06 kg/m2  SpO2 96%  LMP 06/18/2015 Physical Exam  Constitutional: She is oriented to person, place, and time. She appears well-developed and well-nourished. No distress.  HENT:  Head: Normocephalic and atraumatic.  Right Ear: External ear normal.  Left Ear: External ear normal.  Eyes: Conjunctivae and EOM are normal. Pupils are equal, round, and reactive to  light.  Neck: Normal range of motion and phonation normal. Neck supple.  She is able to touch her chin to her chest, with no change in her pain.  Cardiovascular: Normal rate, regular rhythm and normal heart sounds.   Pulmonary/Chest: Effort normal and breath sounds normal. She exhibits no bony tenderness.  Abdominal: Soft. There is no tenderness.  Musculoskeletal: Normal range of motion.  Neurological: She is alert and oriented to person, place, and time. No cranial nerve deficit or sensory deficit. She exhibits normal muscle tone. Coordination normal.  Skin: Skin is warm, dry and intact.  Psychiatric: She has a normal mood and affect. Her behavior is normal.  Judgment and thought content normal.  Nursing note and vitals reviewed.   ED Course  Procedures (including critical care time)  Medications  oxyCODONE-acetaminophen (PERCOCET/ROXICET) 5-325 MG per tablet 1 tablet (1 tablet Oral Given 07/16/15 2030)  ondansetron (ZOFRAN-ODT) disintegrating tablet 4 mg (4 mg Oral Given 07/16/15 2030)  prochlorperazine (COMPAZINE) injection 10 mg (10 mg Intravenous Given 07/16/15 2252)  diphenhydrAMINE (BENADRYL) injection 25 mg (25 mg Intravenous Given 07/16/15 2252)  gadobenate dimeglumine (MULTIHANCE) injection 20 mL (19 mLs Intravenous Contrast Given 07/17/15 0212)  ketorolac (TORADOL) 30 MG/ML injection 30 mg (30 mg Intravenous Given 07/17/15 0317)    No data found.   00:15-Reevaluation with update and discussion. After initial assessment and treatment, an updated evaluation reveals HA resolved. Ambulates easily. Chantae Soo L    Labs Review Labs Reviewed  URINALYSIS, ROUTINE W REFLEX MICROSCOPIC (NOT AT [facility]) - Abnormal; Notable for the following:    Hgb urine dipstick TRACE (*)    Leukocytes, UA SMALL (*)    All other components within normal limits  URINE MICROSCOPIC-ADD ON - Abnormal; Notable for the following:    Bacteria, UA FEW (*)    All other components within normal limits  BASIC METABOLIC PANEL  CBC  CBG MONITORING, ED  POC URINE PREG, ED    Imaging Review Ct Head Wo Contrast  07/17/2015   CLINICAL DATA:  Three-day history of headache. Episode of syncope earlier today  EXAM: CT HEAD WITHOUT CONTRAST  TECHNIQUE: Contiguous axial images were obtained from the base of the skull through the vertex without intravenous contrast.  COMPARISON:  None.  FINDINGS: The ventricles are normal in size and configuration. The left lateral ventricle is slightly larger than the right lateral ventricle, an anatomic variant.  On axial slice 10 series 201, there is a focal area of decreased attenuation in the periphery of the right temporal lobe measuring 1.3 x  0.9 cm. There is no appreciable surrounding edema in this area. There is no other evidence suggesting mass on this study. There is no hemorrhage, extra-axial fluid collection, or midline shift. Gray-white compartments appear normal except for this area of decreased attenuation in the periphery of the right temporal lobe anteriorly. No acute infarct evident. The bony calvarium appears intact. The mastoid air cells are clear.  IMPRESSION: Focal area of decreased attenuation in the periphery of the anterior right temporal lobe of uncertain etiology. This area of decreased attenuation may represent a small mass. This area does not have an appearance suggestive of infarct. This finding warrants pre and post-contrast brain MRI for further assessment. Elsewhere, study is within normal limits.   Electronically Signed   By: Bretta Bang III M.D.   On: 07/17/2015 01:07   Mr Laqueta Jean ZO Contrast  07/17/2015   CLINICAL DATA:  Followup abnormal head CT.  EXAM: MRI HEAD WITHOUT  AND WITH CONTRAST  TECHNIQUE: Multiplanar, multiecho pulse sequences of the brain and surrounding structures were obtained without and with intravenous contrast.  CONTRAST:  19mL MULTIHANCE GADOBENATE DIMEGLUMINE 529 MG/ML IV SOLN  COMPARISON:  Prior CT from 07/16/2015  FINDINGS: Cerebral volume within normal limits for patient age. There are scattered patchy T2/FLAIR hyperintense foci within the periventricular and deep white matter of both cerebral hemispheres, somewhat advanced for patient age.  No abnormal foci of restricted diffusion to suggest acute intracranial infarct. Gray-white matter differentiation maintained. Normal intravascular flow voids are preserved. No acute or chronic intracranial hemorrhage.  No mass lesion, mass effect, or midline shift. No abnormal enhancement on post-contrast sequences.  Craniocervical junction within normal limits. Pituitary gland normal. No acute abnormality about the orbits.  Paranasal sinuses and mastoid  air cells are clear. Inner ear structures within normal limits.  Bone marrow signal intensity normal. Scalp soft tissues unremarkable.  IMPRESSION: 1. No acute intracranial process identified. No mass lesion or abnormal enhancement. Previously described decreased attenuation within the right temporal lobe likely reflected volume averaging with the adjacent sylvian fissure. 2. Multiple nonspecific, nonenhancing cerebral white matter foci within the periventricular and deep white matter of both cerebral hemispheres, mild to moderate for patient age. Differential considerations include accelerated/hereditary small vessel ischemia, sequela of prior trauma, hypercoagulable state, vasculitis, migraines, prior infection, and less likely demyelination.   Electronically Signed   By: Rise Mu M.D.   On: 07/17/2015 02:57     EKG Interpretation   Date/Time:  Monday July 16 2015 20:18:53 EDT Ventricular Rate:  84 PR Interval:  148 QRS Duration: 98 QT Interval:  372 QTC Calculation: 439 R Axis:   76 Text Interpretation:  Normal sinus rhythm Normal ECG ED PHYSICIAN  INTERPRETATION AVAILABLE IN [facility] Confirmed by TEST, Record  ([id number]) on 07/17/2015 7:16:32 AM      MDM   Final diagnoses:  Acute nonintractable headache, unspecified headache type    Nursing Notes Reviewed/ Care Coordinated Applicable Imaging Reviewed Interpretation of Laboratory Data incorporated into ED treatment  Care to Dr. Gwendolyn Grant to evaluate after return of CT result.    Mancel Bale, MD 07/18/15 1326

## 2015-07-17 ENCOUNTER — Emergency Department (HOSPITAL_COMMUNITY): Payer: BLUE CROSS/BLUE SHIELD

## 2015-07-17 MED ORDER — KETOROLAC TROMETHAMINE 30 MG/ML IJ SOLN
30.0000 mg | Freq: Once | INTRAMUSCULAR | Status: AC
  Administered 2015-07-17: 30 mg via INTRAVENOUS
  Filled 2015-07-17: qty 1

## 2015-07-17 MED ORDER — GADOBENATE DIMEGLUMINE 529 MG/ML IV SOLN
20.0000 mL | Freq: Once | INTRAVENOUS | Status: AC | PRN
  Administered 2015-07-17: 19 mL via INTRAVENOUS

## 2015-07-17 NOTE — ED Notes (Signed)
Patient transported to MRI 

## 2015-07-17 NOTE — ED Notes (Signed)
Dr. Walden at bedside 

## 2015-07-17 NOTE — Discharge Instructions (Signed)

## 2015-07-17 NOTE — ED Notes (Signed)
Dr. Wentz at bedside. 

## 2015-07-17 NOTE — ED Notes (Signed)
PT ambulated to restroom with this RN

## 2015-07-17 NOTE — ED Provider Notes (Signed)
0030 - Care from Dr. Effie Shy. Here with a syncopal event. Awaiting CT. 0115 - CT shows area of decreased attenuation, will require MR for further workup.  MR with some chronic changes. Area of decreased attenuation shows no mass. Instructed to f/u with Neuro.  Elwin Mocha, MD 07/17/15 [phone]

## 2015-07-30 ENCOUNTER — Other Ambulatory Visit: Payer: Self-pay | Admitting: Neurology

## 2015-07-30 ENCOUNTER — Encounter: Payer: Self-pay | Admitting: Neurology

## 2015-07-30 ENCOUNTER — Ambulatory Visit (INDEPENDENT_AMBULATORY_CARE_PROVIDER_SITE_OTHER): Payer: BLUE CROSS/BLUE SHIELD | Admitting: Neurology

## 2015-07-30 VITALS — BP 118/80 | HR 77 | Temp 98.2°F | Ht 67.0 in | Wt 192.0 lb

## 2015-07-30 DIAGNOSIS — R519 Headache, unspecified: Secondary | ICD-10-CM

## 2015-07-30 DIAGNOSIS — G932 Benign intracranial hypertension: Secondary | ICD-10-CM

## 2015-07-30 DIAGNOSIS — H93A2 Pulsatile tinnitus, left ear: Secondary | ICD-10-CM

## 2015-07-30 DIAGNOSIS — H9312 Tinnitus, left ear: Secondary | ICD-10-CM | POA: Diagnosis not present

## 2015-07-30 DIAGNOSIS — H905 Unspecified sensorineural hearing loss: Secondary | ICD-10-CM

## 2015-07-30 DIAGNOSIS — G441 Vascular headache, not elsewhere classified: Secondary | ICD-10-CM

## 2015-07-30 DIAGNOSIS — H538 Other visual disturbances: Secondary | ICD-10-CM | POA: Diagnosis not present

## 2015-07-30 DIAGNOSIS — H9190 Unspecified hearing loss, unspecified ear: Secondary | ICD-10-CM

## 2015-07-30 DIAGNOSIS — R51 Headache: Secondary | ICD-10-CM | POA: Diagnosis not present

## 2015-07-30 MED ORDER — TOPIRAMATE ER 50 MG PO CAP24: 50.0000 mg | ORAL_CAPSULE | Freq: Every day | ORAL | Status: DC

## 2015-07-30 MED ORDER — OXYCODONE-ACETAMINOPHEN 10-325 MG PO TABS: 1.0000 | ORAL_TABLET | Freq: Four times a day (QID) | ORAL | Status: DC | PRN

## 2015-07-30 NOTE — Patient Instructions (Addendum)
Remember to drink plenty of fluid, eat healthy meals and do not skip any meals. Try to eat protein with a every meal and eat a healthy snack such as fruit or nuts in between meals. Try to keep a regular sleep-wake schedule and try to exercise daily, particularly in the form of walking, 20-30 minutes a day, if you can.   As far as your medications are concerned, I would like to suggest: [place]  in the evenings  As far as diagnostic testing: MRA of the head to evaluate brain vessels  Lumbar puncture at 12:15pm, [facility] [address], nothing to eat 4 hours before, she can drink whatever she likes, needs a driver 24 hour bed rest afterwards   I would like to see you back in one month, sooner if we need to. Please call us with any interim questions, concerns, problems, updates or refill requests.   Our phone number is [phone]. We also have an after hours call service for urgent matters and there is a physician on-call for urgent questions. For any emergencies you know to call 911 or go to the nearest emergency room

## 2015-07-30 NOTE — Progress Notes (Signed)
[facility]    Provider:  Dr Lucia Gaskins Primary Care Physician:  Haskel Khan  CC:  Headache  HPI:  Carrie Reed is a 29 y.o. female here as a referral from [facility] for headaches. Hx of migraines once a year, very infrequent. Migraines since 2006. She started having worsening headaches a month ago. She woke up one morning with a dull pain and that has steadily gotten worse. Every day, continuous. Sometimes bilateral, sometime on the left. Worse in the morning when she wakes up. This is not her typical migraines. With her migraines she sleeps it off, now it is continuous and a different tyoe of pain. She is currently having nausea and vomiting. Light sensitivity is not as severe as with her migraines. More a feeling of pressure and it hurts on the left(points to the parietal area). Like something is bulging out of the temple. Some sharp persistent pain, not paroxysmal, no lacrimation, injection or rhinorrhea. Left blurry vision, dizzy spells and passing out/losing consciousness. She went to the ER in July because she woke up on the floor and felt her hands and feet were tingly. 3 days later she was in the kitchen having a conversation and she "fell out" and she actually lost consciousness, she was "out" for a minute, no convulsions, her eyes were closed but fluttering inside, her husband caught her before she hit the ground. She has hearing loss in the right ear. She is having pulsatile tinnitus on the left ear. Headaches worse when laying down. She has a positional headache, worse when laying down. Sounds like a baby's heartbeat in her left ear. She has gained 50 pounds in less than a year. Severe 10/10 daily. No Fhx migraines.  Reviewed notes, labs and imaging from outside physicians, which showed: IMPRESSION: 1. No acute intracranial process identified. No mass lesion or abnormal enhancement. Previously described decreased attenuation within the right temporal lobe  likely reflected volume averaging with the adjacent sylvian fissure. 2. Multiple nonspecific, nonenhancing cerebral white matter foci within the periventricular and deep white matter of both cerebral hemispheres, mild to moderate for patient age. Differential considerations include accelerated/hereditary small vessel ischemia, sequela of prior trauma, hypercoagulable state, vasculitis, migraines, prior infection, and less likely demyelination. BMP normal   Review of Systems: Patient complains of symptoms per HPI as well as the following symptoms: weight loss, fatigue, blurred vision, eye pain, easy bruising, cough, snoring, increased thirst, hearing loss, trouble swallowing, moles, urination problems, memory loss, confusion, headache, numbness, weakness, difficulty swallowing, dizziness, passing out, sleepiness, not no sleep, decreased energy, change in appetite.. Pertinent negatives per HPI. All others negative.   Social History   Social History  . Marital Status: Married    Spouse Name: Jonny Ruiz  . Number of Children: 4  . Years of Education: Associates   Occupational History  . [employer]    Social History Main Topics  . Smoking status: Current Every Day Smoker -- 2.00 packs/day    Types: Cigarettes  . Smokeless tobacco: Never Used  . Alcohol Use: 0.0 oz/week    0 Standard drinks or equivalent per week     Comment: social  . Drug Use: No  . Sexual Activity: Not on file   Other Topics Concern  . Not on file   Social History Narrative   Lives at home with husband and 4 children.   Caffeine use:  Drinks coffee (1/2 pot-1 pot every day)        Family History  Problem Relation Age of Onset  . Cirrhosis Mother   . Heart failure Father   . Cancer Sister   . High blood pressure Brother   . Cervical cancer Sister     Past Medical History  Diagnosis Date  . Migraine     Past Surgical History  Procedure Laterality Date  . Bulging disc      cervical  . Appendectomy     . Tympanostomy tube placement    . Tonsillectomy and adenoidectomy      x2    Current Outpatient Prescriptions  Medication Sig Dispense Refill  . aspirin-acetaminophen-caffeine (EXCEDRIN MIGRAINE) 250-250-65 MG per tablet Take 2 tablets by mouth every 6 (six) hours as needed for headache.    Marland Kitchen acetaminophen (TYLENOL) 500 MG tablet Take 1,000 mg by mouth every 6 (six) hours as needed for moderate pain.    Marland Kitchen HYDROcodone-acetaminophen (NORCO/VICODIN) 5-325 MG per tablet Take 1 tablet by mouth as needed.  0  . ibuprofen (ADVIL,MOTRIN) 200 MG tablet Take 800 mg by mouth every 6 (six) hours as needed for moderate pain.    . Ibuprofen-Diphenhydramine HCl (ADVIL PM) 200-25 MG CAPS Take 1 tablet by mouth at bedtime as needed (sleep).    . pantoprazole (PROTONIX) 20 MG tablet Take 1 tablet (20 mg total) by mouth daily. (Patient not taking: Reported on 07/30/2015) 30 tablet 0   No current facility-administered medications for this visit.    Allergies as of 07/30/2015  . (No Known Allergies)    Vitals: BP 118/80 mmHg  Pulse 77  Temp(Src) 98.2 F (36.8 C) (Oral)  Ht 5\' 7"  (1.702 m)  Wt 192 lb (87.091 kg)  BMI 30.06 kg/m2 Last Weight:  Wt Readings from Last 1 Encounters:  07/30/15 192 lb (87.091 kg)   Last Height:   Ht Readings from Last 1 Encounters:  07/30/15 5\' 7"  (1.702 m)   Physical exam: Exam: Gen: NAD, conversant, well nourised, overweight, well groomed                     CV: RRR, no MRG. No Carotid Bruits. No peripheral edema, warm, nontender Eyes: Conjunctivae clear without exudates or hemorrhage  Neuro: Detailed Neurologic Exam  Speech:    Speech is normal; fluent and spontaneous with normal comprehension.  Cognition:    The patient is oriented to person, place, and time;     recent and remote memory intact;     language fluent;     normal attention, concentration,     fund of knowledge Cranial Nerves:    The pupils are equal, round, and reactive to light. The  fundi are normal and spontaneous venous pulsations are present. Visual fields are full to finger confrontation. Extraocular movements are intact. Trigeminal sensation is intact and the muscles of mastication are normal. The face is symmetric. The palate elevates in the midline. Hearing intact. Voice is normal. Shoulder shrug is normal. The tongue has normal motion without fasciculations.   Coordination:    Normal finger to nose and heel to shin. Normal rapid alternating movements.   Gait:    Heel-toe and tandem gait are normal.   Motor Observation:    No asymmetry, no atrophy, and no involuntary movements noted. Tone:    Normal muscle tone.    Posture:    Posture is normal. normal erect    Strength:    Strength is V/V in the upper and lower limbs.      Sensation: intact to LT  Reflex Exam:  DTR's:    Deep tendon reflexes in the upper and lower extremities are normal bilaterally.   Toes:    The toes are downgoing bilaterally.   Clonus:    Clonus is absent.    Assessment/Plan:  29 year old female with no past significant history, migraines infrequently, with greater than a month of worsening pressure-type pain more on the left parietal area with pulsatile tinnitus in the left ear, left eye blurry vision, 10 out of 10 pain, positional and worse when laying down. Neuro exam is unremarkable, fundi without edema however symptoms are suspicious for eye IIH. She has gained about 50 pounds in the last year. MRI of the brain was unremarkable, nonspecific nonenhancing cerebral white matter foci. We'll order an MRA of the head due to pulsatile tinnitus and focal left parietal pain to rule out aneurysm. We'll also order a lumbar puncture with opening pressure and labs to rule out meningitis.   Naomie Dean, MD  [facility] [address] [place], [place] [address]  Phone [phone] Fax [phone]  CC: [facility]

## 2015-07-31 ENCOUNTER — Other Ambulatory Visit: Payer: Self-pay | Admitting: Neurology

## 2015-07-31 ENCOUNTER — Ambulatory Visit
Admission: RE | Admit: 2015-07-31 | Discharge: 2015-07-31 | Disposition: A | Discharge status: Home / Self Care | Payer: BLUE CROSS/BLUE SHIELD | Source: Ambulatory Visit | Attending: Neurology | Admitting: Neurology

## 2015-07-31 ENCOUNTER — Telehealth: Payer: Self-pay | Admitting: Neurology

## 2015-07-31 DIAGNOSIS — R4020 Unspecified coma: Secondary | ICD-10-CM

## 2015-07-31 DIAGNOSIS — G932 Benign intracranial hypertension: Secondary | ICD-10-CM

## 2015-07-31 DIAGNOSIS — G971 Other reaction to spinal and lumbar puncture: Secondary | ICD-10-CM

## 2015-07-31 LAB — CSF CELL COUNT WITH DIFFERENTIAL
RBC COUNT CSF: 32 uL — AB
Tube #: 3
WBC, CSF: 1 cu mm (ref 0–5)

## 2015-07-31 LAB — GLUCOSE, CSF: Glucose, CSF: 57 mg/dL (ref 43–76)

## 2015-07-31 LAB — PROTEIN, CSF: Total Protein, CSF: 55 mg/dL — ABNORMAL HIGH (ref 15–45)

## 2015-07-31 NOTE — Telephone Encounter (Signed)
Spoke w/ pt and she stated she fell down the stairs this morning and hit her head. She bounced down the stairs. She feels like she will be bruised up. I advised it would be best if she went to the emergency room to make sure she does not have bleeding in her head since she hit her head. She verbalized understanding but is supposed to go for LP today at 12pm and wonders what she should do. I told I would speak with Dr. Lucia Gaskins and call her back as soon as possible. She verbalized understanding.

## 2015-07-31 NOTE — Telephone Encounter (Signed)
Patient is calling as she fell down her stairs this morning after her legs gave out from under her.  She would like to speak with you about this and how it relates to the MRI results.  Please call.

## 2015-07-31 NOTE — Discharge Instructions (Signed)

## 2015-07-31 NOTE — Telephone Encounter (Signed)
I spoke with patient. She fell down the stairs, did not significantly hit her head. Denies nausea, vomiting, loss of consciousness, mental status changes, headache, any bruising or skull trauma. She feels fine to go to lumbar puncture. Thanks.

## 2015-08-01 ENCOUNTER — Telehealth: Payer: Self-pay

## 2015-08-01 ENCOUNTER — Encounter (HOSPITAL_COMMUNITY): Payer: Self-pay | Admitting: Family Medicine

## 2015-08-01 ENCOUNTER — Telehealth: Payer: Self-pay | Admitting: Neurology

## 2015-08-01 ENCOUNTER — Emergency Department (HOSPITAL_COMMUNITY)
Admission: EM | Admit: 2015-08-01 | Discharge: 2015-08-01 | Disposition: A | Discharge status: Home / Self Care | Payer: BLUE CROSS/BLUE SHIELD | Attending: Emergency Medicine | Admitting: Emergency Medicine

## 2015-08-01 DIAGNOSIS — R51 Headache: Secondary | ICD-10-CM

## 2015-08-01 DIAGNOSIS — Z8669 Personal history of other diseases of the nervous system and sense organs: Secondary | ICD-10-CM

## 2015-08-01 DIAGNOSIS — G43909 Migraine, unspecified, not intractable, without status migrainosus: Secondary | ICD-10-CM | POA: Diagnosis not present

## 2015-08-01 DIAGNOSIS — G971 Other reaction to spinal and lumbar puncture: Secondary | ICD-10-CM | POA: Diagnosis not present

## 2015-08-01 DIAGNOSIS — F419 Anxiety disorder, unspecified: Secondary | ICD-10-CM | POA: Insufficient documentation

## 2015-08-01 DIAGNOSIS — Z720 Tobacco use: Secondary | ICD-10-CM | POA: Insufficient documentation

## 2015-08-01 DIAGNOSIS — R519 Headache, unspecified: Secondary | ICD-10-CM

## 2015-08-01 MED ORDER — HYDROMORPHONE HCL 1 MG/ML IJ SOLN
1.0000 mg | Freq: Once | INTRAMUSCULAR | Status: AC
  Administered 2015-08-01: 1 mg via INTRAVENOUS
  Filled 2015-08-01: qty 1

## 2015-08-01 MED ORDER — METOCLOPRAMIDE HCL 5 MG/ML IJ SOLN
10.0000 mg | Freq: Once | INTRAMUSCULAR | Status: AC
  Administered 2015-08-01: 10 mg via INTRAVENOUS
  Filled 2015-08-01: qty 2

## 2015-08-01 MED ORDER — SODIUM CHLORIDE 0.9 % IV BOLUS (SEPSIS)
1000.0000 mL | Freq: Once | INTRAVENOUS | Status: AC
  Administered 2015-08-01: 1000 mL via INTRAVENOUS

## 2015-08-01 NOTE — Telephone Encounter (Signed)
Spoke with Victorino Dike at [facility] at 10:40am. She told me they do not normally do a blood patch sooner than 24hr after a LP. She is going to have their nurse contact the pt and discuss what was going on. I let her know what I already spoke with husband and pt about. Dr. Lucia Gaskins is out of the office right now and Dr. Frances Furbish is covering for her this morning. She is aware. She is going to call be back once she speaks to the nurse.

## 2015-08-01 NOTE — Progress Notes (Signed)
Called patient's husband to schedule Epidural Blood Patch, and he said he is now in the ED at [facility] with patient to have her vomiting and dehydration issues addressed.  I told him I felt this was a better approach at this point, as it hasn't even been 24 hours since LP was done.  I told him typically we have patients lie flat for 24 hours after LP, and then another 24 hours if they develop the positional spinal headache, before we perform the Blood Patch.  We discussed how it was unusual for someone to develop her symptoms so quickly after the LP and while still on strict bedrest.  He states he will have [place] follow up with [place] as needed tomorrow or Friday.  Donell Sievert, RN

## 2015-08-01 NOTE — Telephone Encounter (Signed)
Sounds good. Please check in on patient by phone in AM.

## 2015-08-01 NOTE — Telephone Encounter (Signed)
Patient called stating she is vomiting with headache that started at 5am. Headache gets worse if she sits up. She doesn't have anyone to help except her children. Trying to get in touch with husband. Please call and adivse.  Patient can be reached at [phone].

## 2015-08-01 NOTE — Telephone Encounter (Signed)
Called and spoke to pt. Told her order for blood patch placed. Told her I called over to [facility] and left a message for Victorino Dike in spine services to call and get patient scheduled. Also faxed over order to her. She should be hearing from them to get scheduled. She should proceed to ER if she experiences abdominal pain, diarrhea, fever, which would not be equivalent by post LP HA. Told her to call if she had further questions. Pt verbalized understanding.

## 2015-08-01 NOTE — Telephone Encounter (Signed)
Spoke w/ pt husband. She had LP yesterday at 12:30pm yesterday. She was eating and drinking okay after LP. She took oxycodone-acetaminophen 5-325mg  tablet last night and trokendi 17m. Nothing has helped her headache. She has tried to drink lots of fluids but cannot keep anything down. She is throwing up white-frothy substance. I advised her to try drinking fluids that have caffeine in it, but she was crying and said she cannot keep anything down. Her husband is worried and says there is something wrong. She had a headache prior to LP, but not to this extent. Advised we may have her go for a blood patch at [facility] and I will call over to set that up if Dr Frances Furbish approves. He verbalized understanding.

## 2015-08-01 NOTE — Telephone Encounter (Signed)
Spoke with patients husband and informed him that the  opening pressure in his iwife's brain was normal, so the pressure inside is fine. Increased intracranial hypertension is not the cause of her headaches. He verbalized understanding and informed me that he was at ED with wife due to non stop N/V and severe headache.

## 2015-08-01 NOTE — Discharge Instructions (Signed)
It was our pleasure to provide your ER care today - we hope that you feel better.  Rest. Drink plenty of fluids.   Follow up with your neurologist, and radiology for possible blood patch, as planned.  Return to ER if worse, new symptoms, fevers, persistent vomiting, other concern.  You were given pain medication in the ER - no driving for the next 4 hours.     Spinal Headache A spinal headache is a severe headache that can happen after getting a spinal tap, also called lumbar puncture, or an epidural anesthetic. Both of these procedures involve passing a needle through ligaments that run along the back side of your spinal column and into one of the spaces just above your spinal cord. Sometimes spinal fluid leaks through the temporary hole left by the needle. This leak causes a decrease in spinal fluid pressure, which leads to a spinal headache. The headache usually begins within hours or 1-2 days after the procedure, and it lasts until adequate pressure returns as your body creates more spinal fluid. The headache can last a few days and rarely lasts for more than 1 week. SIGNS AND SYMPTOMS   Severe headache pain when sitting or standing.  Decreased headache pain when lying down.  Neck pain, especially when flexing the neck in a chin-to-chest position.  Vomiting. DIAGNOSIS  Diagnosis of spinal headache is usually made based on your recent medical history. Your health care provider will consider the timing of a recent spinal tap or epidural anesthetic, along with how soon your headache occurred afterward. On rare occasions, tests may be done to confirm the diagnosis, such as an MRI. TREATMENT  Treatment may include:  Drinking extra fluids to improve your level of hydration. This will help your body replace the spinal fluid that has leaked out through the needle hole. Receiving IV fluids may be necessary.  Taking pain medicine as prescribed by your health care provider.  Drinking  caffeinated beverages such as soda, coffee, or tea. Caffeine may help to shrink the blood vessels in your brain, which may reduce your headache pain.  Lying flat for a few days.  Having a blood patch procedure, which involves injecting a small amount of your blood at the puncture site to seal the leak. HOME CARE INSTRUCTIONS  Lie down to relieve pain if your pain gets worse when you sit or stand.  Drink enough fluids to keep your urine clear or pale yellow.  Take pain medicine as directed by your health care provider. SEEK IMMEDIATE MEDICAL CARE IF:   Your pain becomes very severe or cannot be controlled.  You develop a fever.  You have a stiff neck.  You lose bowel or bladder control.  You have trouble walking. MAKE SURE YOU:  Understand these instructions.  Will watch your condition.   Will get help right away if you are not doing well or get worse. Document Released: 05/16/2002 Document Revised: 11/29/2013 Document Reviewed: 06/16/2013 [facility] Patient Information 2015 [place], [place]. This information is not intended to replace advice given to you by your health care provider. Make sure you discuss any questions you have with your health care provider.    Migraine Headache A migraine headache is an intense, throbbing pain on one or both sides of your head. A migraine can last for 30 minutes to several hours. CAUSES  The exact cause of a migraine headache is not always known. However, a migraine may be caused when nerves in the brain become irritated  and release chemicals that cause inflammation. This causes pain. Certain things may also trigger migraines, such as:  Alcohol.  Smoking.  Stress.  Menstruation.  Aged cheeses.  Foods or drinks that contain nitrates, glutamate, aspartame, or tyramine.  Lack of sleep.  Chocolate.  Caffeine.  Hunger.  Physical exertion.  Fatigue.  Medicines used to treat chest pain (nitroglycerine), birth control pills,  estrogen, and some blood pressure medicines. SIGNS AND SYMPTOMS  Pain on one or both sides of your head.  Pulsating or throbbing pain.  Severe pain that prevents daily activities.  Pain that is aggravated by any physical activity.  Nausea, vomiting, or both.  Dizziness.  Pain with exposure to bright lights, loud noises, or activity.  General sensitivity to bright lights, loud noises, or smells. Before you get a migraine, you may get warning signs that a migraine is coming (aura). An aura may include:  Seeing flashing lights.  Seeing bright spots, halos, or zigzag lines.  Having tunnel vision or blurred vision.  Having feelings of numbness or tingling.  Having trouble talking.  Having muscle weakness. DIAGNOSIS  A migraine headache is often diagnosed based on:  Symptoms.  Physical exam.  A CT scan or MRI of your head. These imaging tests cannot diagnose migraines, but they can help rule out other causes of headaches. TREATMENT Medicines may be given for pain and nausea. Medicines can also be given to help prevent recurrent migraines.  HOME CARE INSTRUCTIONS  Only take over-the-counter or prescription medicines for pain or discomfort as directed by your health care provider. The use of long-term narcotics is not recommended.  Lie down in a dark, quiet room when you have a migraine.  Keep a journal to find out what may trigger your migraine headaches. For example, write down:  What you eat and drink.  How much sleep you get.  Any change to your diet or medicines.  Limit alcohol consumption.  Quit smoking if you smoke.  Get 7-9 hours of sleep, or as recommended by your health care provider.  Limit stress.  Keep lights dim if bright lights bother you and make your migraines worse. SEEK IMMEDIATE MEDICAL CARE IF:   Your migraine becomes severe.  You have a fever.  You have a stiff neck.  You have vision loss.  You have muscular weakness or loss of  muscle control.  You start losing your balance or have trouble walking.  You feel faint or pass out.  You have severe symptoms that are different from your first symptoms. MAKE SURE YOU:   Understand these instructions.  Will watch your condition.  Will get help right away if you are not doing well or get worse. Document Released: 11/24/2005 Document Revised: 04/10/2014 Document Reviewed: 08/01/2013 [facility] Patient Information 2015 [place], [place]. This information is not intended to replace advice given to you by your health care provider. Make sure you discuss any questions you have with your health care provider.

## 2015-08-01 NOTE — Telephone Encounter (Signed)
Let's proceed with blood patch. If she has other symptoms, such as abdominal pain, diarrhea, fever, which would not be explained by post LP HA, he has to take her to ER. Pls convey. Placing referral to radiology for blood patch.

## 2015-08-01 NOTE — ED Notes (Signed)
Pt here for headache, back pain and vomiting. sts she had a spinal tap yesterday. sts whenever she sits up it makes her sick and severe nausea.

## 2015-08-01 NOTE — Telephone Encounter (Signed)
Thank you :)

## 2015-08-01 NOTE — ED Provider Notes (Signed)
CSN: [id number]     Arrival date & time 08/01/15  1028 History   First MD Initiated Contact with Patient 08/01/15 1101     Chief Complaint  Patient presents with  . Headache  . Emesis     (Consider location/radiation/quality/duration/timing/severity/associated sxs/prior Treatment) Patient is a 29 y.o. female presenting with vomiting and headaches. The history is provided by the patient and the spouse.  Emesis Associated symptoms: headaches   Associated symptoms: no chills and no sore throat   Headache Associated symptoms: nausea and vomiting   Associated symptoms: no eye pain, no neck pain, no neck stiffness and no sore throat   Patient w hx migraines, c/o dull frontal/diffuse headache for the past couple weeks, daily, constant, mod-severe.  Pt has been seen in ED, and saw neurology for same yesterday.  Patients recent workup has include CT, MRI, and LP.  Since LP yesterday, pt states frontal headache seems worse, and that current headache is worse when sits up or stands. +NV. Emesis clear, not bloody or bilious. No abd pain. Denies neck or back pain. No fever or chills. No numbness/weakness. No eye pain or change in vision. No loss of normal functional ability.  Had called neurologist w same and was waiting to hear when blood patch was scheduled - had not heard, and due to pain, nv, returns to ED.    Past Medical History  Diagnosis Date  . Migraine    Past Surgical History  Procedure Laterality Date  . Bulging disc      cervical  . Appendectomy    . Tympanostomy tube placement    . Tonsillectomy and adenoidectomy      x2   Family History  Problem Relation Age of Onset  . Cirrhosis Mother   . Heart failure Father   . Cancer Sister   . High blood pressure Brother   . Cervical cancer Sister    Social History  Substance Use Topics  . Smoking status: Current Every Day Smoker -- 2.00 packs/day    Types: Cigarettes  . Smokeless tobacco: Never Used  . Alcohol Use: 0.0 oz/week     0 Standard drinks or equivalent per week     Comment: social   OB History    No data available     Review of Systems  Constitutional: Negative for chills.  HENT: Negative for sore throat.   Eyes: Negative for pain, redness and visual disturbance.  Respiratory: Negative for shortness of breath.   Gastrointestinal: Positive for nausea and vomiting.  Genitourinary: Negative for flank pain.  Musculoskeletal: Negative for neck pain and neck stiffness.  Skin: Negative for rash.  Neurological: Positive for headaches. Negative for speech difficulty.  Hematological: Does not bruise/bleed easily.  Psychiatric/Behavioral: Negative for confusion.      Allergies  Review of patient's allergies indicates no known allergies.  Home Medications   Prior to Admission medications   Medication Sig Start Date End Date Taking? Authorizing Provider  acetaminophen (TYLENOL) 500 MG tablet Take 1,000 mg by mouth every 6 (six) hours as needed for moderate pain.    Historical Provider, MD  aspirin-acetaminophen-caffeine (EXCEDRIN MIGRAINE) [phone] MG per tablet Take 2 tablets by mouth every 6 (six) hours as needed for headache.    Historical Provider, MD  HYDROcodone-acetaminophen (NORCO/VICODIN) 5-325 MG per tablet Take 1 tablet by mouth as needed. 06/29/15   Historical Provider, MD  ibuprofen (ADVIL,MOTRIN) 200 MG tablet Take 800 mg by mouth every 6 (six) hours as needed for moderate  pain.    Historical Provider, MD  Ibuprofen-Diphenhydramine HCl (ADVIL PM) 200-25 MG CAPS Take 1 tablet by mouth at bedtime as needed (sleep).    Historical Provider, MD  oxyCODONE-acetaminophen (PERCOCET) 10-325 MG per tablet Take 1 tablet by mouth every 6 (six) hours as needed for pain. 07/30/15   Anson Fret, MD  pantoprazole (PROTONIX) 20 MG tablet Take 1 tablet (20 mg total) by mouth daily. Patient not taking: Reported on 07/30/2015 06/29/15   Catha Gosselin, PA-C  Topiramate ER (TROKENDI XR) 50 MG CP24 Take 50  mg by mouth at bedtime. 07/30/15   Anson Fret, MD   BP 127/74 mmHg  Pulse 72  Temp(Src) 97.3 F (36.3 C) (Oral)  Resp 18  SpO2 100%  LMP 07/07/2015 Physical Exam  Constitutional: She is oriented to person, place, and time. She appears well-developed and well-nourished. No distress.  HENT:  Head: Atraumatic.  Nose: Nose normal.  Mouth/Throat: Oropharynx is clear and moist.  No sinus or temporal tenderness.  Eyes: Conjunctivae and EOM are normal. Pupils are equal, round, and reactive to light. No scleral icterus.  Neck: Neck supple. No tracheal deviation present. No thyromegaly present.  No stiffness or rigidity.   Cardiovascular: Normal rate, regular rhythm, normal heart sounds and intact distal pulses.  Exam reveals no gallop and no friction rub.   No murmur heard. Pulmonary/Chest: Effort normal and breath sounds normal. No respiratory distress.  Abdominal: Soft. Normal appearance and bowel sounds are normal. She exhibits no distension. There is no tenderness.  Genitourinary:  No cva tenderness.  Musculoskeletal: Normal range of motion. She exhibits no edema or tenderness.  Lower back LP site with bandaid, no sign of infection  Neurological: She is alert and oriented to person, place, and time. No cranial nerve deficit.  Speech clear, fluent. Motor intact bilaterally, stre 5/5. sens grossly intact. Pt indicates unable to stand currently due to worsening of headache.   Skin: Skin is warm and dry. No rash noted. She is not diaphoretic.  Psychiatric:  Anxious, tearful.  Nursing note and vitals reviewed.   ED Course  Procedures (including critical care time) Labs Review   Results for orders placed or performed in visit on 07/31/15  CSF culture  Result Value Ref Range   Gram Stain No WBC Seen    Gram Stain No Organisms Seen    Gram Stain Gram Stain Report Called to,Read Back By     Gram Stain and Verified With:    Gram Stain JENNIFER SANDERS 08/01/15 10:00AM BY COOKV     Preliminary Report NO GROWTH 1 DAY   Fungus Culture with Smear  Result Value Ref Range   Preliminary Report Culture in Progress for 4 Weeks    Smear Result No Yeast or Fungal Elements Seen   HSV PCR  Result Value Ref Range   Specimen Source-HSVPCR     HSV, PCR     HSV 2 , PCR    CSF cell count with differential  Result Value Ref Range   Tube # 3    Color, CSF COLORLESS COLORLESS   Appearance, CSF CLEAR CLEAR   Supernatant NOT INDICATED COLORLESS   WBC, CSF 1 0 - 5 cu mm   RBC Count, CSF 32 (H) 0 cu mm   Segmented Neutrophils-CSF NOT PERFORMED 0 - 6 %   Lymphs, CSF NOT PERFORMED 40 - 80 %   Monocyte/Macrophage NOT PERFORMED 15 - 45 %   Eosinophils, CSF NOT PERFORMED 0 - 1 %  Other Cells, CSF SEE NOTE   Glucose, CSF  Result Value Ref Range   Glucose, CSF 57 43 - 76 mg/dL  Protein, CSF  Result Value Ref Range   Total Protein, CSF 55 (H) 15 - 45 mg/dL   Ct Head Wo Contrast  07/17/2015   CLINICAL DATA:  Three-day history of headache. Episode of syncope earlier today  EXAM: CT HEAD WITHOUT CONTRAST  TECHNIQUE: Contiguous axial images were obtained from the base of the skull through the vertex without intravenous contrast.  COMPARISON:  None.  FINDINGS: The ventricles are normal in size and configuration. The left lateral ventricle is slightly larger than the right lateral ventricle, an anatomic variant.  On axial slice 10 series 201, there is a focal area of decreased attenuation in the periphery of the right temporal lobe measuring 1.3 x 0.9 cm. There is no appreciable surrounding edema in this area. There is no other evidence suggesting mass on this study. There is no hemorrhage, extra-axial fluid collection, or midline shift. Gray-white compartments appear normal except for this area of decreased attenuation in the periphery of the right temporal lobe anteriorly. No acute infarct evident. The bony calvarium appears intact. The mastoid air cells are clear.  IMPRESSION: Focal area of  decreased attenuation in the periphery of the anterior right temporal lobe of uncertain etiology. This area of decreased attenuation may represent a small mass. This area does not have an appearance suggestive of infarct. This finding warrants pre and post-contrast brain MRI for further assessment. Elsewhere, study is within normal limits.   Electronically Signed   By: Bretta Bang III M.D.   On: 07/17/2015 01:07   Mr Laqueta Jean ZO Contrast  07/17/2015   CLINICAL DATA:  Followup abnormal head CT.  EXAM: MRI HEAD WITHOUT AND WITH CONTRAST  TECHNIQUE: Multiplanar, multiecho pulse sequences of the brain and surrounding structures were obtained without and with intravenous contrast.  CONTRAST:  19mL MULTIHANCE GADOBENATE DIMEGLUMINE 529 MG/ML IV SOLN  COMPARISON:  Prior CT from 07/16/2015  FINDINGS: Cerebral volume within normal limits for patient age. There are scattered patchy T2/FLAIR hyperintense foci within the periventricular and deep white matter of both cerebral hemispheres, somewhat advanced for patient age.  No abnormal foci of restricted diffusion to suggest acute intracranial infarct. Gray-white matter differentiation maintained. Normal intravascular flow voids are preserved. No acute or chronic intracranial hemorrhage.  No mass lesion, mass effect, or midline shift. No abnormal enhancement on post-contrast sequences.  Craniocervical junction within normal limits. Pituitary gland normal. No acute abnormality about the orbits.  Paranasal sinuses and mastoid air cells are clear. Inner ear structures within normal limits.  Bone marrow signal intensity normal. Scalp soft tissues unremarkable.  IMPRESSION: 1. No acute intracranial process identified. No mass lesion or abnormal enhancement. Previously described decreased attenuation within the right temporal lobe likely reflected volume averaging with the adjacent sylvian fissure. 2. Multiple nonspecific, nonenhancing cerebral white matter foci within the  periventricular and deep white matter of both cerebral hemispheres, mild to moderate for patient age. Differential considerations include accelerated/hereditary small vessel ischemia, sequela of prior trauma, hypercoagulable state, vasculitis, migraines, prior infection, and less likely demyelination.   Electronically Signed   By: Rise Mu M.D.   On: 07/17/2015 02:57   Dg Fluoro Guide Lumbar Puncture  07/31/2015   CLINICAL DATA:  Idiopathic intracranial hypertension  EXAM: LUMBAR PUNCTURE UNDER FLUOROSCOPY  FLUOROSCOPY TIME:  13 seconds, 116 uGym2 DAP  TECHNIQUE: The procedure, risks (including but not limited to  bleeding, infection, organ damage ), benefits, and alternatives were explained to the patient. Questions regarding the procedure were encouraged and answered. The patient understands and consents to the procedure. Patient placed left lateral decubitus. An appropriate skin entry site was determined fluoroscopically. Operator donned sterile gloves and mask. Skin site was marked, then prepped with Betadine, draped in usual sterile fashion, and infiltrated locally with 1% lidocaine. A 20 gauge spinal needle advanced into the thecal sac at L2-3 from a left interlaminar approach. Clear colorless CSF spontaneously returned, with opening pressure of 16 cm H2O. 6ml CSF were collected and divided among 4 sterile vials for the requested laboratory studies. The needle was then removed.  COMPLICATIONS: None immediate  IMPRESSION: 1. Technically successful lumbar puncture under fluoroscopy.   Electronically Signed   By: Corlis Leak M.D.   On: 07/31/2015 14:09      MDM   Iv ns bolus. reglan iv. Dilaudid 1 mg iv.  Pt had blood patch scheduled by her neurologist/radiology - will check on timing.  Reviewed nursing notes and prior charts for additional history.   Recent LP neg acute, cxs pending.  Recheck pain much improved, 1/10.  No nv.  Afeb.  Pt has spoken w her neurologist/and radiology  is setting up f/u, incl blood patch.  Pt currently appears stable for d/c.       Cathren Laine, MD 08/01/15 1233

## 2015-08-01 NOTE — Telephone Encounter (Signed)
Left detailed message for Victorino Dike in spine services at [facility]. Told her pt name/DOB and explained Dr. Frances Furbish (who is covering for Dr. Lucia Gaskins due to Dr. Lucia Gaskins being out of office) would like pt scheduled for blood patch. Orders placed for blood patch by Dr. Frances Furbish. Told her I will also fax over the order. Gave Pt name/DOB/MRN again. Asked her to return my call to ensure she got my message. Gave [facility] phone number.   Faxed over order with insurance/demographic sheet 08/01/15 at 9:42am. Received fax confirmation.

## 2015-08-01 NOTE — Telephone Encounter (Signed)
Unfortunately, patient appears to have a severe post LP headache, home treatment for this is:  Strict bedrest and laying flat for 24 hours after the LP was done, lots of fluid and caffeine containing fluids, and tylenol for pain reduction. If this has been done and exhausted, call the radiology facility where patient had LP and patient may need blood patch.   Emma: pls call patient or husband back with above, then if needed, call the radiology department about setting up blood patch, which I can then order/place referral. thanks

## 2015-08-01 NOTE — Telephone Encounter (Signed)
FYI-Jennifer with [facility] is calling regarding the patient. Victorino Dike states Dr. Carlota Raspberry is going to do the blood patch for the patient.

## 2015-08-01 NOTE — Telephone Encounter (Signed)
Spoke w/ pt. She went for LP yesterday and her headache got much worse after she had LP. She cannot lay, sit, stand to relieve headache. She is nauseous/vomiting. Husband on way home to be with her. She feels the LP caused headache to be this bad. She has been laying flat as advised. I told her Dr. Lucia Gaskins is out of the office until Friday and I will have to speak with Dr. Frances Furbish, the work-in and we will call her back. She verbalized understanding.

## 2015-08-02 ENCOUNTER — Ambulatory Visit
Admission: RE | Admit: 2015-08-02 | Discharge: 2015-08-02 | Disposition: A | Discharge status: Home / Self Care | Payer: BLUE CROSS/BLUE SHIELD | Source: Ambulatory Visit | Attending: Neurology | Admitting: Neurology

## 2015-08-02 ENCOUNTER — Encounter: Payer: Self-pay | Admitting: Neurology

## 2015-08-02 ENCOUNTER — Other Ambulatory Visit: Payer: Self-pay | Admitting: Neurology

## 2015-08-02 DIAGNOSIS — H93A2 Pulsatile tinnitus, left ear: Secondary | ICD-10-CM | POA: Insufficient documentation

## 2015-08-02 DIAGNOSIS — G441 Vascular headache, not elsewhere classified: Secondary | ICD-10-CM

## 2015-08-02 DIAGNOSIS — R519 Headache, unspecified: Secondary | ICD-10-CM | POA: Insufficient documentation

## 2015-08-02 DIAGNOSIS — G43719 Chronic migraine without aura, intractable, without status migrainosus: Secondary | ICD-10-CM | POA: Insufficient documentation

## 2015-08-02 DIAGNOSIS — G43909 Migraine, unspecified, not intractable, without status migrainosus: Secondary | ICD-10-CM | POA: Insufficient documentation

## 2015-08-02 DIAGNOSIS — R51 Headache: Secondary | ICD-10-CM

## 2015-08-02 DIAGNOSIS — H538 Other visual disturbances: Secondary | ICD-10-CM | POA: Insufficient documentation

## 2015-08-02 DIAGNOSIS — G971 Other reaction to spinal and lumbar puncture: Secondary | ICD-10-CM

## 2015-08-02 DIAGNOSIS — H9190 Unspecified hearing loss, unspecified ear: Secondary | ICD-10-CM

## 2015-08-02 LAB — HERPES SIMPLEX VIRUS(HSV) DNA BY PCR
HSV 1 DNA: NOT DETECTED
HSV 2 DNA: NOT DETECTED

## 2015-08-02 LAB — HSV PCR

## 2015-08-02 MED ORDER — IOHEXOL 180 MG/ML  SOLN
1.0000 mL | Freq: Once | INTRAMUSCULAR | Status: DC | PRN
  Administered 2015-08-02: 1 mL via EPIDURAL

## 2015-08-02 NOTE — Telephone Encounter (Signed)
Left VM for pt/husband to call back. Gave [facility] phone number.

## 2015-08-02 NOTE — Progress Notes (Signed)
20cc blood drawn for Epidural Blood Patch from right AC space; site unremarkable.  Donell Sievert, RN

## 2015-08-02 NOTE — Telephone Encounter (Signed)
Spoke with husband.  [facility] f/u with them and they scheduled her for blood patch today around 11:00am/11:15am today. He stated he took his wife to the ER yesterday and they gave her fluids/pain and nausea meds. She slept a little in the ER and went home and slept. When she woke up, she felt better.  A little later on last night, she started to have a lot of pressure/pain in head when she would stand up. She is not vomiting this morning and is eating and drinking coffee. She still is having a lot of pressure in her head when she stands up. I advised him that Dr. Lucia Gaskins is out of the office and will be back tomorrow. I will let her know she went for a blood patch and we will call them with any further results. He asked about MRA results that she had done this past Monday at [facility]. I advised they were not back yet and can take up to a week, but we will call once those come back. He verbalized understanding.

## 2015-08-02 NOTE — Telephone Encounter (Signed)
Will continue to monitor post blood patch today.

## 2015-08-02 NOTE — Discharge Instructions (Addendum)

## 2015-08-02 NOTE — Telephone Encounter (Signed)
Thank you both for taking care of this patient.

## 2015-08-03 LAB — CSF CULTURE
GRAM STAIN: NONE SEEN
ORGANISM ID, BACTERIA: NO GROWTH

## 2015-08-03 LAB — CSF CULTURE W GRAM STAIN: Gram Stain: NONE SEEN

## 2015-08-05 ENCOUNTER — Telehealth: Payer: Self-pay | Admitting: Neurology

## 2015-08-05 NOTE — Telephone Encounter (Signed)
The patient called. She has had a lumbar puncture, and she is complaining of positional type headache, she is bedbound at this time.  She may need a blood patch to control her symptoms, if she is still feeling bad tomorrow, she is to contact our office.

## 2015-08-06 NOTE — Telephone Encounter (Signed)
Spoke w/ pt and set up EEG for tomorrow at 11:00am. Told her to check-in 15 minutes prior. She verbalized understanding.  She also was wondering about her medication, Trokendi that she takes. It makes her sick. Wondering if there is something else she can take. Told her I will ask Dr Lucia Gaskins and will call her back.

## 2015-08-06 NOTE — Telephone Encounter (Signed)
Left detailed message for pt on VM. Okay to leave message per DPR. Told her MRA was normal. I will let Dr. Lucia Gaskins know she does not think she needs blood patch, she is feeling better. She had spoke with Dr. Anne Hahn about this. Gave [facility] phone number to have her call back to let us know she got the message.

## 2015-08-06 NOTE — Telephone Encounter (Signed)
Patient called stating she is doing much better today. No headache just light pressure front of face. She does not feel she needs a blood patch. She can be reached at [phone].

## 2015-08-06 NOTE — Telephone Encounter (Signed)
Thank you! Yes please ask her to schedule an EEG because of episodes of loss of consciousness and falling down. thanks

## 2015-08-07 ENCOUNTER — Telehealth: Payer: Self-pay | Admitting: Neurology

## 2015-08-07 ENCOUNTER — Telehealth: Payer: Self-pay | Admitting: *Deleted

## 2015-08-07 ENCOUNTER — Other Ambulatory Visit: Payer: Self-pay | Admitting: Neurology

## 2015-08-07 ENCOUNTER — Ambulatory Visit (INDEPENDENT_AMBULATORY_CARE_PROVIDER_SITE_OTHER): Payer: BLUE CROSS/BLUE SHIELD | Admitting: Neurology

## 2015-08-07 DIAGNOSIS — R55 Syncope and collapse: Secondary | ICD-10-CM

## 2015-08-07 DIAGNOSIS — R4020 Unspecified coma: Secondary | ICD-10-CM

## 2015-08-07 MED ORDER — NORTRIPTYLINE HCL 10 MG PO CAPS: 20.0000 mg | ORAL_CAPSULE | Freq: Every day | ORAL | Status: DC

## 2015-08-07 MED ORDER — CYCLOBENZAPRINE HCL 5 MG PO TABS: 5.0000 mg | ORAL_TABLET | Freq: Three times a day (TID) | ORAL | Status: DC | PRN

## 2015-08-07 NOTE — Telephone Encounter (Signed)
-----   Message from Anson Fret, MD sent at 08/07/2015  1:14 PM EDT ----- Let patient know the csf was unremarkable. Also I will call her about new medications since the topamax is not working thanks.

## 2015-08-07 NOTE — Telephone Encounter (Signed)
She did not tolerate the Topamax. Will try Nortriptyline as this will help her sleep as well. Discussed side effects including constipation, somnolence, dry mouth, possible cardiac side effects but patient does not report any cardiac history or SOB or CP. Discussed teratogenic effects.

## 2015-08-07 NOTE — Telephone Encounter (Signed)
Spoke to patient, EEG normal. Advised her that she shouldn't drive for 6 months episode free after having episodes of loss of consciousness especially if they have not been diagnosed

## 2015-08-07 NOTE — Procedures (Signed)
    History:  Carrie Reed is a 29 year old patient with a history of migraine headaches. The patient has had an episode of loss of consciousness. No convulsive activity was noted. The patient being evaluated for the blackout events.  This is a routine EEG. No skull defects are noted. Medications include Tylenol, Excedrin Migraine, ibuprofen, oxycodone, Protonix, and Trokendi.   EEG classification: Normal awake and asleep  Description of the recording: The background rhythms of this recording consists of a fairly well modulated medium amplitude background activity of 9 Hz. As the record progresses, the patient initially is in the waking state, but appears to enter the early stage II sleep during the recording, with rudimentary sleep spindles and vertex sharp wave activity seen. During the wakeful state, photic stimulation was not performed. Hyperventilation was performed, and this results in a minimal buildup of the background rhythm activities without significant slowing seen. At no time during the recording does there appear to be evidence of spike or spike wave discharges or evidence of focal slowing. EKG monitor shows no evidence of cardiac rhythm abnormalities with a heart rate of 78.  Impression: This is a normal EEG recording in the waking and sleeping state. No evidence of ictal or interictal discharges were seen at any time during the recording.

## 2015-08-07 NOTE — Telephone Encounter (Signed)
Called patient. Left message. Will call back.

## 2015-08-07 NOTE — Telephone Encounter (Signed)
Spoke with pt. Advised her CSF was unremarkable. She verbalized understanding. She has not taken topamax since this past Friday night. Makes her sick to her stomach. Told her Dr. Lucia Gaskins is going to call her to discuss other medications. She verbalized understanding.

## 2015-08-26 LAB — FUNGUS CULTURE W SMEAR: SMEAR RESULT: NONE SEEN

## 2015-10-05 NOTE — Telephone Encounter (Signed)
Error

## 2015-11-26 ENCOUNTER — Emergency Department (HOSPITAL_COMMUNITY)
Admission: EM | Admit: 2015-11-26 | Discharge: 2015-11-27 | Disposition: A | Discharge status: Home / Self Care | Payer: BLUE CROSS/BLUE SHIELD | Attending: Emergency Medicine | Admitting: Emergency Medicine

## 2015-11-26 ENCOUNTER — Encounter (HOSPITAL_COMMUNITY): Payer: Self-pay | Admitting: Emergency Medicine

## 2015-11-26 DIAGNOSIS — R1011 Right upper quadrant pain: Secondary | ICD-10-CM | POA: Insufficient documentation

## 2015-11-26 DIAGNOSIS — F17210 Nicotine dependence, cigarettes, uncomplicated: Secondary | ICD-10-CM | POA: Insufficient documentation

## 2015-11-26 DIAGNOSIS — G43909 Migraine, unspecified, not intractable, without status migrainosus: Secondary | ICD-10-CM | POA: Diagnosis not present

## 2015-11-26 DIAGNOSIS — Z3202 Encounter for pregnancy test, result negative: Secondary | ICD-10-CM | POA: Insufficient documentation

## 2015-11-26 DIAGNOSIS — R1013 Epigastric pain: Secondary | ICD-10-CM | POA: Insufficient documentation

## 2015-11-26 DIAGNOSIS — Z7982 Long term (current) use of aspirin: Secondary | ICD-10-CM | POA: Diagnosis not present

## 2015-11-26 LAB — CBC
HCT: 39.3 % (ref 36.0–46.0)
Hemoglobin: 13.3 g/dL (ref 12.0–15.0)
MCH: 30.7 pg (ref 26.0–34.0)
MCHC: 33.8 g/dL (ref 30.0–36.0)
MCV: 90.8 fL (ref 78.0–100.0)
PLATELETS: 274 10*3/uL (ref 150–400)
RBC: 4.33 MIL/uL (ref 3.87–5.11)
RDW: 13 % (ref 11.5–15.5)
WBC: 7.6 10*3/uL (ref 4.0–10.5)

## 2015-11-26 LAB — URINE MICROSCOPIC-ADD ON

## 2015-11-26 LAB — COMPREHENSIVE METABOLIC PANEL
ALBUMIN: 4 g/dL (ref 3.5–5.0)
ALK PHOS: 63 U/L (ref 38–126)
ALT: 29 U/L (ref 14–54)
ANION GAP: 8 (ref 5–15)
AST: 27 U/L (ref 15–41)
BILIRUBIN TOTAL: 0.2 mg/dL — AB (ref 0.3–1.2)
BUN: 8 mg/dL (ref 6–20)
CO2: 25 mmol/L (ref 22–32)
Calcium: 9.4 mg/dL (ref 8.9–10.3)
Chloride: 106 mmol/L (ref 101–111)
Creatinine, Ser: 0.78 mg/dL (ref 0.44–1.00)
GFR calc Af Amer: >60 mL/min (ref >60)
GLUCOSE: 111 mg/dL — AB (ref 65–99)
POTASSIUM: 3.9 mmol/L (ref 3.5–5.1)
Sodium: 139 mmol/L (ref 135–145)
Total Protein: 6.8 g/dL (ref 6.5–8.1)

## 2015-11-26 LAB — URINALYSIS, ROUTINE W REFLEX MICROSCOPIC
GLUCOSE, UA: NEGATIVE mg/dL
Ketones, ur: NEGATIVE mg/dL
Leukocytes, UA: NEGATIVE
Nitrite: NEGATIVE
PH: 6 (ref 5.0–8.0)
PROTEIN: NEGATIVE mg/dL
SPECIFIC GRAVITY, URINE: 1.033 — AB (ref 1.005–1.030)

## 2015-11-26 LAB — LIPASE, BLOOD: Lipase: 30 U/L (ref 11–51)

## 2015-11-26 LAB — POC URINE PREG, ED: Preg Test, Ur: NEGATIVE

## 2015-11-26 MED ORDER — FENTANYL CITRATE (PF) 100 MCG/2ML IJ SOLN
100.0000 ug | Freq: Once | INTRAMUSCULAR | Status: AC
  Administered 2015-11-26: 100 ug via INTRAVENOUS
  Filled 2015-11-26: qty 2

## 2015-11-26 MED ORDER — SODIUM CHLORIDE 0.9 % IV BOLUS (SEPSIS)
1000.0000 mL | Freq: Once | INTRAVENOUS | Status: AC
  Administered 2015-11-26: 1000 mL via INTRAVENOUS

## 2015-11-26 NOTE — ED Notes (Signed)
MD at bedside. 

## 2015-11-26 NOTE — ED Notes (Signed)
Pt. reports intermittent upper abdominal pain radiating to right back for several months worse after eating with mild nausea , denies emesis or diarrhea . No fever or chills .

## 2015-11-26 NOTE — ED Provider Notes (Signed)
CSN: [id number]     Arrival date & time 11/26/15  2207 History   By signing my name below, I, [name], attest that this documentation has been prepared under the direction and in the presence of [name], MD . Electronically Signed: [name], Scribe. 11/26/2015. 11:40 PM.    Chief Complaint  Patient presents with  . Abdominal Pain    The history is provided by the patient. No language interpreter was used.     HPI Comments:  Carrie Reed is a 29 y.o. female who presents to the Emergency Department complaining of moderate RUQ abdominal pain that radiates around to her back. She states her pain has been constant for the last 3 days. She has been experiencing the same pain intermittently for a few months. She describes her pain as sharp, stabbing, and burning. She notes her pain is often exacerbated after eating. She denies hematuria, dysuria, frequent urination, fever and chills. She denies h/o ETOH abuse and illict drug use. No alleviating factors noted. She has been followed by GI for symptom, states she is unable to follow up with GI until Jan 2017. She also denies nausea and vomiting at this time.   [place]   Past Medical History  Diagnosis Date  . Migraine    Past Surgical History  Procedure Laterality Date  . Bulging disc      cervical  . Appendectomy    . Tympanostomy tube placement    . Tonsillectomy and adenoidectomy      x2  . Tonsillectomy     Family History  Problem Relation Age of Onset  . Cirrhosis Mother   . Heart failure Father   . Cancer Sister   . High blood pressure Brother   . Cervical cancer Sister   . Migraines Neg Hx    Social History  Substance Use Topics  . Smoking status: Current Every Day Smoker -- 0.00 packs/day    Types: Cigarettes  . Smokeless tobacco: Never Used  . Alcohol Use: 0.0 oz/week    0 Standard drinks or equivalent per week     Comment: social   OB History    No data available     Review of Systems   Constitutional: Negative for fever and chills.  Gastrointestinal: Positive for abdominal pain. Negative for nausea and vomiting.  Genitourinary: Negative for dysuria, frequency and hematuria.  All other systems reviewed and are negative.  Allergies  Review of patient's allergies indicates no known allergies.  Home Medications   Prior to Admission medications   Medication Sig Start Date End Date Taking? Authorizing Provider  acetaminophen (TYLENOL) 500 MG tablet Take 1,000 mg by mouth every 6 (six) hours as needed for moderate pain.   Yes Historical Provider, MD  aspirin-acetaminophen-caffeine (EXCEDRIN MIGRAINE) [phone] MG per tablet Take 2 tablets by mouth every 6 (six) hours as needed for headache.   Yes Historical Provider, MD  cyclobenzaprine (FLEXERIL) 5 MG tablet Take 1 tablet (5 mg total) by mouth every 8 (eight) hours as needed for muscle spasms. 08/07/15  Yes Anson Fret, MD  ibuprofen (ADVIL,MOTRIN) 200 MG tablet Take 800 mg by mouth every 6 (six) hours as needed for moderate pain.   Yes Historical Provider, MD  Ibuprofen-Diphenhydramine HCl (ADVIL PM) 200-25 MG CAPS Take 1 tablet by mouth at bedtime as needed (sleep).   Yes Historical Provider, MD  nortriptyline (PAMELOR) 10 MG capsule Take 2 capsules (20 mg total) by mouth at bedtime. Patient not taking: Reported  on 11/26/2015 08/07/15   Anson FretAntonia B Ahern, MD  oxyCODONE-acetaminophen (PERCOCET) 10-325 MG per tablet Take 1 tablet by mouth every 6 (six) hours as needed for pain. Patient not taking: Reported on 11/26/2015 07/30/15   Anson FretAntonia B Ahern, MD  pantoprazole (PROTONIX) 20 MG tablet Take 1 tablet (20 mg total) by mouth daily. Patient not taking: Reported on 07/30/2015 06/29/15   Catha GosselinHanna Patel-Mills, PA-C   BP 115/65 mmHg  Pulse 67  Temp(Src) 98.2 F (36.8 C) (Oral)  Resp 16  Ht 5\' 7"  (1.702 m)  Wt 195 lb (88.451 kg)  BMI 30.53 kg/m2  SpO2 97%  LMP 11/12/2015 (Approximate) Physical Exam  Constitutional: She is  oriented to person, place, and time. She appears well-developed and well-nourished. No distress.  HENT:  Head: Normocephalic and atraumatic.  Eyes: Conjunctivae are normal.  Cardiovascular: Normal rate, regular rhythm and normal heart sounds.   Pulmonary/Chest: Effort normal and breath sounds normal. No respiratory distress.  Abdominal: Soft. She exhibits no distension. There is tenderness (Epigastric and RUQ). There is no CVA tenderness.  No suprapubic tenderness  Musculoskeletal:  No CVA or midline tenderness   Neurological: She is alert and oriented to person, place, and time.  Skin: Skin is warm and dry. No rash noted.  Psychiatric: She has a normal mood and affect.  Nursing note and vitals reviewed.   ED Course  Procedures   DIAGNOSTIC STUDIES:  Oxygen Saturation is 100% on RA, normal by my interpretation.    COORDINATION OF CARE:  11:30 PM Discussed treatment plan with pt at bedside and pt agreed to plan.  Labs Review Labs Reviewed  COMPREHENSIVE METABOLIC PANEL - Abnormal; Notable for the following:    Glucose, Bld 111 (*)    Total Bilirubin 0.2 (*)    All other components within normal limits  URINALYSIS, ROUTINE W REFLEX MICROSCOPIC (NOT AT [facility]) - Abnormal; Notable for the following:    APPearance CLOUDY (*)    Specific Gravity, Urine 1.033 (*)    Hgb urine dipstick SMALL (*)    Bilirubin Urine SMALL (*)    All other components within normal limits  URINE MICROSCOPIC-ADD ON - Abnormal; Notable for the following:    Squamous Epithelial / LPF 6-30 (*)    Bacteria, UA FEW (*)    All other components within normal limits  LIPASE, BLOOD  CBC  POC URINE PREG, ED    Imaging Review [place] Abdomen Limited  11/27/2015  CLINICAL DATA:  29 year old female with right upper quadrant abdominal pain EXAM: US ABDOMEN LIMITED - RIGHT UPPER QUADRANT COMPARISON:  Abdominal CT dated 10/17/2014 and ultrasound dated 10/1914. HIDA scan dated 10/31/2014 FINDINGS: Gallbladder: No  gallstones or wall thickening visualized. No sonographic Murphy sign noted by sonographer. Common bile duct: Diameter: 2 mm Liver: No focal lesion identified. Within normal limits in parenchymal echogenicity. IMPRESSION: Unremarkable right upper quadrant ultrasound. Electronically Signed   By: Elgie CollardArash  Radparvar M.D.   On: 11/27/2015 00:35   I have personally reviewed and evaluated these images and lab results as part of my medical decision-making.   EKG Interpretation None      MDM   Final diagnoses:  Epigastric pain    Epigastric and ruq abdominal pain for months, worsened in last few days, itnermittent sharp in nature. No cause found in past. Slightly ttp on initial exam, improved prior to dc. Labs ok, no e/o pancreatitis, cholecystitis. Will continue to follow with GI. No other complaints or concerns for acute, emergent causes for her  symptoms.   I personally performed the services described in this documentation, which was scribed in my presence. The recorded information has been reviewed and is accurate.    [name], MD 11/27/15 [phone]

## 2015-11-27 ENCOUNTER — Emergency Department (HOSPITAL_COMMUNITY): Payer: BLUE CROSS/BLUE SHIELD

## 2015-11-27 NOTE — ED Notes (Signed)
Pt back from US

## 2016-01-03 ENCOUNTER — Telehealth: Payer: Self-pay | Admitting: Neurology

## 2016-01-03 NOTE — Telephone Encounter (Signed)
LVM returning call from pt. Please offer f/u appt either tomorrow or next available. Gave [facility] phone number.

## 2016-01-03 NOTE — Telephone Encounter (Signed)
Pt called said nortriptyline (PAMELOR) 10 MG capsule she can't wake up in the morning, no matter how much earlier in the evening she takes it. It is hard to concentrate too. She said it was easing her HA's for the 1st month but over the past 3 wks the HA's have increased again in severity and frequency. She said HA is always there, most of the time it's dull. She has been awaken with HA during the night 3 times over past 3 wks. These are the same symptoms she had prior to starting medication.

## 2016-01-03 NOTE — Telephone Encounter (Signed)
Patient returned Emma's call, appointment scheduled for tomorrow Friday 01/04/16 11am to arrive 10:45am, patient advised to bring new insurance card, copay and picture ID.

## 2016-01-04 ENCOUNTER — Encounter: Payer: Self-pay | Admitting: Neurology

## 2016-01-04 ENCOUNTER — Ambulatory Visit (INDEPENDENT_AMBULATORY_CARE_PROVIDER_SITE_OTHER): Payer: 59 | Admitting: Neurology

## 2016-01-04 VITALS — BP 136/86 | HR 79 | Ht 67.0 in | Wt 191.6 lb

## 2016-01-04 DIAGNOSIS — G43019 Migraine without aura, intractable, without status migrainosus: Secondary | ICD-10-CM

## 2016-01-04 DIAGNOSIS — R5382 Chronic fatigue, unspecified: Secondary | ICD-10-CM

## 2016-01-04 MED ORDER — TIZANIDINE HCL 2 MG PO TABS: 2.0000 mg | ORAL_TABLET | Freq: Three times a day (TID) | ORAL | Status: DC

## 2016-01-04 MED ORDER — VENLAFAXINE HCL ER 37.5 MG PO CP24: 75.0000 mg | ORAL_CAPSULE | Freq: Every day | ORAL | Status: DC

## 2016-01-04 NOTE — Patient Instructions (Addendum)
Remember to drink plenty of fluid, eat healthy meals and do not skip any meals. Try to eat protein with a every meal and eat a healthy snack such as fruit or nuts in between meals. Try to keep a regular sleep-wake schedule and try to exercise daily, particularly in the form of walking, 20-30 minutes a day, if you can.   As far as your medications are concerned, I would like to suggest: Start Venlafaxine 1 pill daily then increase to 2 pills daily Tizanide  three times a day Stop the excedrin  I would like to see you back in 3 months, sooner if we need to. Please call us with any interim questions, concerns, problems, updates or refill requests.   Our phone number is [phone]. We also have an after hours call service for urgent matters and there is a physician on-call for urgent questions. For any emergencies you know to call 911 or go to the nearest emergency room

## 2016-01-04 NOTE — Progress Notes (Signed)
[facility]    Provider:  Dr Lucia Gaskins Referring Provider: Gus Height, PA-C Primary Care Physician:  Haskel Khan  CC:  Headache  HPI:  Carrie Reed is a 30 y.o. female here as a referral from Dr. Laural Benes for migraines. The [place] made her feel sick, nausea. The nortriptyline has helped her sleep but it is making her feel hung over. She goes to bed between 830 and 11pm. She is not sleeping 8 hours. She is very fatigued during the day and the nortriptyline is making it worse. She has gained a lot of weight. She doesn't have an appetite. She makes herself eat. She is just not hungry. She stopped taking the nortriptyline. Headaches are in the evening and when she wakes in the morning. It is very strong behind the left eye, it has woken her out of her sleep, her head pounds with her heartbeat, they can be severe. Headache is continuous. Gets more intense at night. She ha failed amitriptyline and Topamax due to side effects. She is taking excedrin migraine daily which can cause rebound headaches.  EEG, MRI of the brain/MRA of the head, Lp were all normal.   HPI: Carrie Reed is a 30 y.o. female here as a referral from Gus Height for headaches. Hx of migraines once a year, very infrequent. Migraines since 2006. She started having worsening headaches a month ago. She woke up one morning with a dull pain and that has steadily gotten worse. Every day, continuous. Sometimes bilateral, sometime on the left. Worse in the morning when she wakes up. This is not her typical migraines. With her migraines she sleeps it off, now it is continuous and a different tyoe of pain. She is currently having nausea and vomiting. Light sensitivity is not as severe as with her migraines. More a feeling of pressure and it hurts on the left(points to the parietal area). Like something is bulging out of the temple. Some sharp persistent pain, not paroxysmal, no lacrimation, injection or  rhinorrhea. Left blurry vision, dizzy spells and passing out/losing consciousness. She went to the ER in July because she woke up on the floor and felt her hands and feet were tingly. 3 days later she was in the kitchen having a conversation and she "fell out" and she actually lost consciousness, she was "out" for a minute, no convulsions, her eyes were closed but fluttering inside, her husband caught her before she hit the ground. She has hearing loss in the right ear. She is having pulsatile tinnitus on the left ear. Headaches worse when laying down. She has a positional headache, worse when laying down. Sounds like a baby's heartbeat in her left ear. She has gained 50 pounds in less than a year. Severe 10/10 daily. No Fhx migraines.  Reviewed notes, labs and imaging from outside physicians, which showed: IMPRESSION: 1. No acute intracranial process identified. No mass lesion or abnormal enhancement. Previously described decreased attenuation within the right temporal lobe likely reflected volume averaging with the adjacent sylvian fissure. 2. Multiple nonspecific, nonenhancing cerebral white matter foci within the periventricular and deep white matter of both cerebral hemispheres, mild to moderate for patient age. Differential considerations include accelerated/hereditary small vessel ischemia, sequela of prior trauma, hypercoagulable state, vasculitis, migraines, prior infection, and less likely demyelination. BMP normal  Review of Systems: Patient complains of symptoms per HPI as well as the following symptoms: No CP, no SOB. Pertinent negatives per HPI. All others negative.   Social History   Social History  .  Marital Status: Married    Spouse Name: Jonny Ruiz  . Number of Children: 4  . Years of Education: Associates   Occupational History  . [employer]    Social History Main Topics  . Smoking status: Current Every Day Smoker -- 0.00 packs/day    Types: Cigarettes  . Smokeless  tobacco: Never Used  . Alcohol Use: 0.0 oz/week    0 Standard drinks or equivalent per week     Comment: social  . Drug Use: No  . Sexual Activity: Not on file   Other Topics Concern  . Not on file   Social History Narrative   Lives at home with husband and 4 children.   Caffeine use:  Drinks coffee (1/2 pot-1 pot every day)        Family History  Problem Relation Age of Onset  . Cirrhosis Mother   . Heart failure Father   . Cancer Sister   . High blood pressure Brother   . Cervical cancer Sister   . Migraines Neg Hx     Past Medical History  Diagnosis Date  . Migraine     Past Surgical History  Procedure Laterality Date  . Bulging disc      cervical  . Appendectomy    . Tympanostomy tube placement    . Tonsillectomy and adenoidectomy      x2  . Tonsillectomy      Current Outpatient Prescriptions  Medication Sig Dispense Refill  . aspirin-acetaminophen-caffeine (EXCEDRIN MIGRAINE) 250-250-65 MG per tablet Take 2 tablets by mouth every 6 (six) hours as needed for headache.    . cyclobenzaprine (FLEXERIL) 5 MG tablet Take 1 tablet (5 mg total) by mouth every 8 (eight) hours as needed for muscle spasms. 30 tablet 6  . nortriptyline (PAMELOR) 10 MG capsule Take 2 capsules (20 mg total) by mouth at bedtime. (Patient not taking: Reported on 11/26/2015) 60 capsule 6   No current facility-administered medications for this visit.    Allergies as of 01/04/2016  . (No Known Allergies)    Vitals: BP 136/86 mmHg  Pulse 79  Ht  (1.702 m)  Wt 191 lb 9.6 oz (86.909 kg)  BMI 30.00 kg/m2 Last Weight:  Wt Readings from Last 1 Encounters:  01/04/16 191 lb 9.6 oz (86.909 kg)   Last Height:   Ht Readings from Last 1 Encounters:  01/04/16  (1.702 m)   Physical exam: Exam: Gen: NAD, conversant, well nourised, obese, well groomed                     CV: RRR, no MRG. No Carotid Bruits. No peripheral edema, warm, nontender Eyes: Conjunctivae clear without  exudates or hemorrhage  Neuro: Detailed Neurologic Exam  Speech:    Speech is normal; fluent and spontaneous with normal comprehension.  Cognition:    The patient is oriented to person, place, and time;     recent and remote memory intact;     language fluent;     normal attention, concentration,     fund of knowledge Cranial Nerves:    The pupils are equal, round, and reactive to light. The fundi are normal and spontaneous venous pulsations are present. Visual fields are full to finger confrontation. Extraocular movements are intact. Trigeminal sensation is intact and the muscles of mastication are normal. The face is symmetric. The palate elevates in the midline. Hearing intact. Voice is normal. Shoulder shrug is normal. The tongue has normal motion without  fasciculations.   Coordination:    Normal finger to nose and heel to shin. Normal rapid alternating movements.   Gait:    Heel-toe and tandem gait are normal.   Motor Observation:    No asymmetry, no atrophy, and no involuntary movements noted. Tone:    Normal muscle tone.    Posture:    Posture is normal. normal erect    Strength:    Strength is V/V in the upper and lower limbs.      Sensation: intact to LT     Reflex Exam:  DTR's:    Deep tendon reflexes in the upper and lower extremities are normal bilaterally.   Toes:    The toes are downgoing bilaterally.   Clonus:    Clonus is absent.       Assessment/Plan:  30 year old with migraine. She takes excedrin daily and this is likely causing medication overuse headache. Fatigue and stress likely highly contributing.  Failed topamax and nortriptyline due to side effects Takes 6 excedrin a day - likely medication overuse and rebound, advised her to stop She has reactions to steroids, cant give a taper.   Start Venlafaxine for migraines. Tizanidine  tid.  Will check a bmp and tsh   Naomie Dean, MD  [facility] [address]  [address] [place], [place] [address]  Phone [phone] Fax [phone]  A total of 30 minutes was spent face-to-face with this patient. Over half this time was spent on counseling patient on the migraine, medication overuse headache, fatigue and stress diagnosis and different diagnostic and therapeutic options available.

## 2016-01-05 LAB — BASIC METABOLIC PANEL
BUN/Creatinine Ratio: 11 (ref 8–20)
BUN: 8 mg/dL (ref 6–20)
CO2: 22 mmol/L (ref 18–29)
Calcium: 9.6 mg/dL (ref 8.7–10.2)
Chloride: 101 mmol/L (ref 96–106)
Creatinine, Ser: 0.74 mg/dL (ref 0.57–1.00)
GFR, EST AFRICAN AMERICAN: 125 mL/min/{1.73_m2} (ref >59)
GFR, EST NON AFRICAN AMERICAN: 108 mL/min/{1.73_m2} (ref >59)
Glucose: 85 mg/dL (ref 65–99)
Potassium: 4.6 mmol/L (ref 3.5–5.2)
SODIUM: 139 mmol/L (ref 134–144)

## 2016-01-05 LAB — THYROID PANEL WITH TSH
Free Thyroxine Index: 1.6 (ref 1.2–4.9)
T3 Uptake Ratio: 26 % (ref 24–39)
T4 TOTAL: 6 ug/dL (ref 4.5–12.0)
TSH: 1.41 u[IU]/mL (ref 0.450–4.500)

## 2016-01-07 ENCOUNTER — Telehealth: Payer: Self-pay | Admitting: *Deleted

## 2016-01-07 NOTE — Telephone Encounter (Signed)
Spoke to pt about normal labs. She verbalized understanding.  

## 2016-01-07 NOTE — Telephone Encounter (Signed)
-----   Message from Anson Fret, MD sent at 01/05/2016 10:35 PM EST ----- Labs normal

## 2016-02-02 ENCOUNTER — Emergency Department (HOSPITAL_COMMUNITY)
Admission: EM | Admit: 2016-02-02 | Discharge: 2016-02-03 | Disposition: A | Discharge status: Home / Self Care | Payer: 59 | Attending: Emergency Medicine | Admitting: Emergency Medicine

## 2016-02-02 ENCOUNTER — Encounter (HOSPITAL_COMMUNITY): Payer: Self-pay | Admitting: *Deleted

## 2016-02-02 DIAGNOSIS — S300XXA Contusion of lower back and pelvis, initial encounter: Secondary | ICD-10-CM | POA: Diagnosis not present

## 2016-02-02 DIAGNOSIS — Y998 Other external cause status: Secondary | ICD-10-CM | POA: Insufficient documentation

## 2016-02-02 DIAGNOSIS — Y9389 Activity, other specified: Secondary | ICD-10-CM | POA: Diagnosis not present

## 2016-02-02 DIAGNOSIS — W108XXA Fall (on) (from) other stairs and steps, initial encounter: Secondary | ICD-10-CM | POA: Diagnosis not present

## 2016-02-02 DIAGNOSIS — F17210 Nicotine dependence, cigarettes, uncomplicated: Secondary | ICD-10-CM | POA: Diagnosis not present

## 2016-02-02 DIAGNOSIS — G43909 Migraine, unspecified, not intractable, without status migrainosus: Secondary | ICD-10-CM | POA: Diagnosis not present

## 2016-02-02 DIAGNOSIS — S3992XA Unspecified injury of lower back, initial encounter: Secondary | ICD-10-CM | POA: Diagnosis present

## 2016-02-02 DIAGNOSIS — Z79899 Other long term (current) drug therapy: Secondary | ICD-10-CM | POA: Diagnosis not present

## 2016-02-02 DIAGNOSIS — Y9289 Other specified places as the place of occurrence of the external cause: Secondary | ICD-10-CM | POA: Insufficient documentation

## 2016-02-02 MED ORDER — OXYCODONE-ACETAMINOPHEN 5-325 MG PO TABS
ORAL_TABLET | ORAL | Status: AC
  Filled 2016-02-02: qty 1

## 2016-02-02 MED ORDER — CYCLOBENZAPRINE HCL 10 MG PO TABS: 10.0000 mg | ORAL_TABLET | Freq: Three times a day (TID) | ORAL | Status: DC | PRN

## 2016-02-02 MED ORDER — OXYCODONE-ACETAMINOPHEN 5-325 MG PO TABS
1.0000 | ORAL_TABLET | Freq: Once | ORAL | Status: AC
  Administered 2016-02-02: 1 via ORAL

## 2016-02-02 MED ORDER — IBUPROFEN 800 MG PO TABS: 800.0000 mg | ORAL_TABLET | Freq: Three times a day (TID) | ORAL | Status: DC

## 2016-02-02 MED ORDER — ONDANSETRON 4 MG PO TBDP
8.0000 mg | ORAL_TABLET | Freq: Once | ORAL | Status: AC
  Administered 2016-02-02: 8 mg via ORAL
  Filled 2016-02-02: qty 2

## 2016-02-02 MED ORDER — KETOROLAC TROMETHAMINE 60 MG/2ML IM SOLN
60.0000 mg | Freq: Once | INTRAMUSCULAR | Status: AC
  Administered 2016-02-02: 60 mg via INTRAMUSCULAR
  Filled 2016-02-02: qty 2

## 2016-02-02 MED ORDER — OXYCODONE-ACETAMINOPHEN 5-325 MG PO TABS: 1.0000 | ORAL_TABLET | ORAL | Status: DC | PRN

## 2016-02-02 MED ORDER — HYDROMORPHONE HCL 1 MG/ML IJ SOLN
1.0000 mg | Freq: Once | INTRAMUSCULAR | Status: AC
  Administered 2016-02-02: 1 mg via INTRAMUSCULAR
  Filled 2016-02-02: qty 1

## 2016-02-02 NOTE — Discharge Instructions (Signed)
Tailbone Injury °The tailbone (coccyx) is the small bone at the lower end of the spine. A tailbone injury may involve stretched ligaments, bruising, or a broken bone (fracture). Tailbone injuries can be painful, and some may take a long time to heal. °CAUSES °This condition is often caused by falling and landing on the tailbone. Other causes include: °· Repeated strain or friction from actions such as rowing and bicycling. °· Childbirth. °In some cases, the cause may not be known. °RISK FACTORS °This condition is more common in women than in men. °SYMPTOMS °Symptoms of this condition include: °· Pain in the lower back, especially when sitting. °· Pain or difficulty when standing up from a sitting position. °· Bruising in the tailbone area. °· Painful bowel movements. °· In women, pain during intercourse. °DIAGNOSIS °This condition may be diagnosed based on your symptoms and a physical exam. X-rays may be taken if a fracture is suspected. You may also have other tests, such as a CT scan or MRI. °TREATMENT °This condition may be treated with medicines to help relieve your pain. Most tailbone injuries heal on their own in 4-6 weeks. However, recovery time may be longer if the injury involves a fracture. °HOME CARE INSTRUCTIONS °· Take medicines only as directed by your health care provider. °· If directed, apply ice to the injured area: °¨ Put ice in a plastic bag. °¨ Place a towel between your skin and the bag. °¨ Leave the ice on for 20 minutes, 2-3 times per day for the first 1-2 days. °· Sit on a large, rubber or inflated ring or cushion to ease your pain. Lean forward when you are sitting to help decrease discomfort. °· Avoid sitting for long periods of time. °· Increase your activity as the pain allows. Perform any exercises that are recommended by your health care provider or physical therapist. °· If you have pain during bowel movements, use stool softeners as directed by your health care provider. °· Eat a  diet that includes plenty of fiber to help prevent constipation. °· Keep all follow-up visits as directed by your health care provider. This is important. °PREVENTION °Wear appropriate padding and sports gear when bicycling and rowing. This can help to prevent developing an injury that is caused by repeated strain or friction. °SEEK MEDICAL CARE IF: °· Your pain becomes worse. °· Your bowel movements cause a great deal of discomfort. °· You are unable to have a bowel movement. °· You have uncontrolled urine loss (urinary incontinence). °· You have a fever. °  °This information is not intended to replace advice given to you by your health care provider. Make sure you discuss any questions you have with your health care provider. °  °Document Released: 11/21/2000 Document Revised: 04/10/2015 Document Reviewed: 11/20/2014 °Elsevier Interactive Patient Education ©2016 [employer] ° °

## 2016-02-02 NOTE — ED Notes (Signed)
Pt states she fell backwards down 2 steps about 2 hours ago. Pain in the lower back area. Pt took 2 advil pm prior to arrival. Pt ambulatory to triage.

## 2016-02-02 NOTE — ED Provider Notes (Signed)
CSN: [id number]     Arrival date & time 02/02/16  2159 History  By signing my name below, I, Doreatha Martin, attest that this documentation has been prepared under the direction and in the presence of Gilda Crease, MD. Electronically Signed: Doreatha Martin, ED Scribe. 02/02/2016. 11:20 PM.    Chief Complaint  Patient presents with  . Back Pain   The history is provided by the patient. No language interpreter was used.   HPI Comments: Carrie Reed is a 30 y.o. female with h/o cervical bulging disc who presents to the Emergency Department complaining of moderate lower back and tailbone pain s/p mechanical fall down two wooden steps just PTA. Pt states she was carrying sheets down the stairs and slipped, falling onto her lower back. She denies LOC or head injury. Pt states her pain is worsened with movement, ambulation and lying flat. Pt states she took 2 advil PTA with no relief. She denies additional injuries.   Past Medical History  Diagnosis Date  . Migraine    Past Surgical History  Procedure Laterality Date  . Bulging disc      cervical  . Appendectomy    . Tympanostomy tube placement    . Tonsillectomy and adenoidectomy      x2  . Tonsillectomy     Family History  Problem Relation Age of Onset  . Cirrhosis Mother   . Heart failure Father   . Cancer Sister   . High blood pressure Brother   . Cervical cancer Sister   . Migraines Neg Hx    Social History  Substance Use Topics  . Smoking status: Current Every Day Smoker -- 0.00 packs/day    Types: Cigarettes  . Smokeless tobacco: Never Used  . Alcohol Use: 0.0 oz/week    0 Standard drinks or equivalent per week     Comment: social   OB History    No data available     Review of Systems  Musculoskeletal: Positive for back pain.  Neurological: Negative for syncope and headaches.  All other systems reviewed and are negative.  Allergies  Review of patient's allergies indicates no known allergies.  Home  Medications   Prior to Admission medications   Medication Sig Start Date End Date Taking? Authorizing Provider  venlafaxine XR (EFFEXOR XR) 37.5 MG 24 hr capsule Take 2 capsules (75 mg total) by mouth daily with breakfast. 01/04/16  Yes Anson Fret, MD  cyclobenzaprine (FLEXERIL) 10 MG tablet Take 1 tablet (10 mg total) by mouth 3 (three) times daily as needed for muscle spasms. 02/02/16   Gilda Crease, MD  ibuprofen (ADVIL,MOTRIN) 800 MG tablet Take 1 tablet (800 mg total) by mouth 3 (three) times daily. 02/02/16   Gilda Crease, MD  oxyCODONE-acetaminophen (PERCOCET) 5-325 MG tablet Take 1-2 tablets by mouth every 4 (four) hours as needed. 02/02/16   Gilda Crease, MD  tiZANidine (ZANAFLEX) 2 MG tablet Take 1 tablet (2 mg total) by mouth 3 (three) times daily. 01/04/16   Anson Fret, MD   BP 113/83 mmHg  Pulse 75  Temp(Src) 97.6 F (36.4 C) (Oral)  Resp 22  Ht  (1.702 m)  Wt 182 lb (82.555 kg)  BMI 28.50 kg/m2  SpO2 97%  LMP 12/30/2015 Physical Exam  Constitutional: She is oriented to person, place, and time. She appears well-developed and well-nourished. No distress.  HENT:  Head: Normocephalic and atraumatic.  Right Ear: Hearing normal.  Left Ear: Hearing normal.  Nose: Nose normal.  Mouth/Throat: Oropharynx is clear and moist and mucous membranes are normal.  Eyes: Conjunctivae and EOM are normal. Pupils are equal, round, and reactive to light.  Neck: Normal range of motion. Neck supple.  Cardiovascular: Regular rhythm.  Exam reveals no gallop and no friction rub.   No murmur heard. Pulmonary/Chest: Effort normal and breath sounds normal. No respiratory distress. She exhibits no tenderness.  Abdominal: Soft. Normal appearance and bowel sounds are normal. There is no hepatosplenomegaly. There is no tenderness. There is no rebound, no guarding, no tenderness at McBurney's point and negative Murphy's sign. No hernia.  Musculoskeletal: Normal range  of motion. She exhibits tenderness.  Coccygeal  tenderness without crepitus.   Neurological: She is alert and oriented to person, place, and time. She has normal strength. No cranial nerve deficit or sensory deficit. Coordination normal. GCS eye subscore is 4. GCS verbal subscore is 5. GCS motor subscore is 6.  Skin: Skin is warm and dry. No rash noted.  Psychiatric: She has a normal mood and affect. Her behavior is normal. Thought content normal.  Nursing note and vitals reviewed.   ED Course  Procedures (including critical care time) DIAGNOSTIC STUDIES: Oxygen Saturation is 98% on RA, normal by my interpretation.    COORDINATION OF CARE: 11:12 PM Discussed treatment plan with pt at bedside which includes pain management and pt agreed to plan.   MDM   Final diagnoses:  Coccygeal contusion, initial encounter   Presents with isolated tailbone injury. Patient slipped walking down steps and landed in seated position on edge of step. Did not hit head. No neck or upper back pain. Patient with normal neurologic function. No lumbar tenderness. Does not require imaging.  I personally performed the services described in this documentation, which was scribed in my presence. The recorded information has been reviewed and is accurate.   Gilda Crease, MD 02/02/16 [phone]

## 2016-02-03 NOTE — ED Notes (Signed)
Pt stable, ambulatory, states understanding of discharge instructions 

## 2016-04-08 ENCOUNTER — Other Ambulatory Visit: Payer: Self-pay | Admitting: Neurology

## 2016-04-08 ENCOUNTER — Ambulatory Visit: Payer: 59 | Admitting: Neurology

## 2016-04-08 ENCOUNTER — Telehealth: Payer: Self-pay | Admitting: Neurology

## 2016-04-08 MED ORDER — VENLAFAXINE HCL ER 150 MG PO CP24: 150.0000 mg | ORAL_CAPSULE | Freq: Every day | ORAL | Status: DC

## 2016-04-08 NOTE — Telephone Encounter (Signed)
Pt called in about her medication having been switched 3 times. She says her current medication is not working now either. She is now having headaches , and she has missed work due to them. Please call [phone]

## 2016-04-08 NOTE — Telephone Encounter (Signed)
I can ibcrease her venlafaxine to 150mg , please let her know thanks

## 2016-04-08 NOTE — Telephone Encounter (Signed)
Per Phone staff, patient cx because: "she said something came up , emergency. would not elaborate though".

## 2016-04-08 NOTE — Telephone Encounter (Signed)
Called pt. Relayed Dr Lucia GaskinsAhern can increase venlafaxine to 150mg , 1 capsule with breakfast. Dr Lucia GaskinsAhern called in new prescription for her. Advised her to make sure she keeps f/u on 5/19 and Dr Lucia GaskinsAhern will f/u with her on how she is tolerating medication dose increase. She can call in meantime if she has further questions. She verbalized understanding.

## 2016-04-08 NOTE — Telephone Encounter (Signed)
Dr Lucia GaskinsAhern- FYI Called pt back. Advised she will have to come in for f/u. Dr Lucia GaskinsAhern cannot do f/u over the phone. I asked why she cx appt today. Pt stated "Something came up". I r/s f/u to 5/19 instead of in June so she can come in earlier. Advised we will discuss medication management, ect at her office visit. She stated that she started on trokendi and this caused her stomach to be upset. She then switched to nortriptyline and it worked great for 2 weeks and then her headaches came back. Dr Lucia GaskinsAhern then started her on venlafaxine and she also did great on this, but the last couple weeks she has noticed her headaches have come back. She is wondering if she is "getting used" to the medications. She denied having severe allergies. She states she does not think it is the weather or allergies. I advised Dr Lucia GaskinsAhern can discuss all this at f/u appt. If Dr Lucia GaskinsAhern would like me to call her before that, I will call her. She verbalized understanding.

## 2016-04-25 ENCOUNTER — Encounter: Payer: Self-pay | Admitting: Neurology

## 2016-04-25 ENCOUNTER — Ambulatory Visit (INDEPENDENT_AMBULATORY_CARE_PROVIDER_SITE_OTHER): Payer: 59 | Admitting: Neurology

## 2016-04-25 VITALS — BP 120/82 | HR 93 | Resp 16 | Ht 67.0 in | Wt 196.0 lb

## 2016-04-25 DIAGNOSIS — G43719 Chronic migraine without aura, intractable, without status migrainosus: Secondary | ICD-10-CM

## 2016-04-25 NOTE — Progress Notes (Signed)
[facility]    Provider:  Dr Lucia GaskinsAhern Referring Provider: Haskel KhanJohnson, Andrea, PA-C Primary Care Physician:  Haskel KhanJOHNSON,ANDREA, PA-C  CC: Headache  Interval history 04/25/2016:   Failed: Topiramate, Nortriptyline,   Interval history 04/25/2016: She has been taking the venlafaxine which is not helping. She has failed Topiramate, nortriptyline. She does not take OTC medicine, last excedrin 5 months ago. Headaches are daily, behind the left eye, has light sensitivity and needs sunglasses, sound sensitivity, lots of nausea, no vomiting. She has 30/30 headaches for the last year or longer, headaches with at least 10 being migrainous. Discussed Botox and clinical trials we are running currently. She stopped taking Excedrin daily.  Interval history 01/04/2016: Arvin CollardJamie Pecha is a 30 y.o. female here as a referral from Dr. Laural BenesJohnson for migraines. The Trokendi made her feel sick, nausea. The nortriptyline has helped her sleep but it is making her feel hung over. She goes to bed between 830 and 11pm. She is not sleeping 8 hours. She is very fatigued during the day and the nortriptyline is making it worse. She has gained a lot of weight. She doesn't have an appetite. She makes herself eat. She is just not hungry. She stopped taking the nortriptyline. Headaches are in the evening and when she wakes in the morning. It is very strong behind the left eye, it has woken her out of her sleep, her head pounds with her heartbeat, they can be severe. Headache is continuous. Gets more intense at night. She ha failed amitriptyline and Topamax due to side effects. She is taking excedrin migraine daily which can cause rebound headaches.  EEG, MRI of the brain/MRA of the head, Lp were all normal.   Initial visit 07/30/2015: Arvin CollardJamie Ribeiro is a 30 y.o. female here as a referral from Gus HeightAndrea Johnson for headaches. Hx of migraines once a year, very infrequent. Migraines since 2006. She started having worsening  headaches a month ago. She woke up one morning with a dull pain and that has steadily gotten worse. Every day, continuous. Sometimes bilateral, sometime on the left. Worse in the morning when she wakes up. This is not her typical migraines. With her migraines she sleeps it off, now it is continuous and a different tyoe of pain. She is currently having nausea and vomiting. Light sensitivity is not as severe as with her migraines. More a feeling of pressure and it hurts on the left(points to the parietal area). Like something is bulging out of the temple. Some sharp persistent pain, not paroxysmal, no lacrimation, injection or rhinorrhea. Left blurry vision, dizzy spells and passing out/losing consciousness. She went to the ER in July because she woke up on the floor and felt her hands and feet were tingly. 3 days later she was in the kitchen having a conversation and she "fell out" and she actually lost consciousness, she was "out" for a minute, no convulsions, her eyes were closed but fluttering inside, her husband caught her before she hit the ground. She has hearing loss in the right ear. She is having pulsatile tinnitus on the left ear. Headaches worse when laying down. She has a positional headache, worse when laying down. Sounds like a baby's heartbeat in her left ear. She has gained 50 pounds in less than a year. Severe 10/10 daily. No Fhx migraines.  Reviewed notes, labs and imaging from outside physicians, which showed: IMPRESSION: 1. No acute intracranial process identified. No mass lesion or abnormal enhancement. Previously described decreased attenuation within the right temporal  lobe likely reflected volume averaging with the adjacent sylvian fissure. 2. Multiple nonspecific, nonenhancing cerebral white matter foci within the periventricular and deep white matter of both cerebral hemispheres, mild to moderate for patient age. Differential considerations include accelerated/hereditary small vessel  ischemia, sequela of prior trauma, hypercoagulable state, vasculitis, migraines, prior infection, and less likely demyelination. BMP normal  Review of Systems: Patient complains of symptoms per HPI as well as the following symptoms: No CP, no SOB. Pertinent negatives per HPI. All others negative.  Social History   Social History  . Marital Status: Married    Spouse Name: Jonny Ruiz  . Number of Children: 4  . Years of Education: Associates   Occupational History  . [employer]    Social History Main Topics  . Smoking status: Current Every Day Smoker -- 0.00 packs/day    Types: Cigarettes  . Smokeless tobacco: Never Used  . Alcohol Use: 0.0 oz/week    0 Standard drinks or equivalent per week     Comment: social  . Drug Use: No  . Sexual Activity: Not on file   Other Topics Concern  . Not on file   Social History Narrative   Lives at home with husband and 4 children.   Caffeine use:  Drinks coffee (1/2 pot-1 pot every day)        Family History  Problem Relation Age of Onset  . Cirrhosis Mother   . Heart failure Father   . Cancer Sister   . High blood pressure Brother   . Cervical cancer Sister   . Migraines Neg Hx     Past Medical History  Diagnosis Date  . Migraine     Past Surgical History  Procedure Laterality Date  . Bulging disc      cervical  . Appendectomy    . Tympanostomy tube placement    . Tonsillectomy and adenoidectomy      x2  . Tonsillectomy      Current Outpatient Prescriptions  Medication Sig Dispense Refill  . cyclobenzaprine (FLEXERIL) 10 MG tablet Take 1 tablet (10 mg total) by mouth 3 (three) times daily as needed for muscle spasms. 20 tablet 0  . ibuprofen (ADVIL,MOTRIN) 800 MG tablet Take 1 tablet (800 mg total) by mouth 3 (three) times daily. 21 tablet 0  . oxyCODONE-acetaminophen (PERCOCET) 5-325 MG tablet Take 1-2 tablets by mouth every 4 (four) hours as needed. 20 tablet 0  . tiZANidine (ZANAFLEX) 2 MG tablet Take 1 tablet  (2 mg total) by mouth 3 (three) times daily. 90 tablet 3  . venlafaxine XR (EFFEXOR-XR) 150 MG 24 hr capsule Take 1 capsule (150 mg total) by mouth daily with breakfast. 30 capsule 12  . [DISCONTINUED] nortriptyline (PAMELOR) 10 MG capsule Take 2 capsules (20 mg total) by mouth at bedtime. (Patient not taking: Reported on 11/26/2015) 60 capsule 6   No current facility-administered medications for this visit.    Allergies as of 04/25/2016  . (No Known Allergies)    Vitals: There were no vitals taken for this visit. Last Weight:  Wt Readings from Last 1 Encounters:  02/02/16 182 lb (82.555 kg)   Last Height:   Ht Readings from Last 1 Encounters:  02/02/16  (1.702 m)    Neuro: Detailed Neurologic Exam  Speech:  Speech is normal; fluent and spontaneous with normal comprehension.  Cognition:  The patient is oriented to person, place, and time;   recent and remote memory intact;   language fluent;  normal attention, concentration,   fund of knowledge Cranial Nerves:  The pupils are equal, round, and reactive to light. The fundi are normal and spontaneous venous pulsations are present. Visual fields are full to finger confrontation. Extraocular movements are intact. Trigeminal sensation is intact and the muscles of mastication are normal. The face is symmetric. The palate elevates in the midline. Hearing intact. Voice is normal. Shoulder shrug is normal. The tongue has normal motion without fasciculations.   Coordination:  Normal finger to nose and heel to shin. Normal rapid alternating movements.   Gait:  Heel-toe and tandem gait are normal.   Motor Observation:  No asymmetry, no atrophy, and no involuntary movements noted. Tone:  Normal muscle tone.   Posture:  Posture is normal. normal erect   Strength:  Strength is V/V in the upper and lower limbs.    Sensation: intact to LT   Reflex Exam:  DTR's:  Deep tendon  reflexes in the upper and lower extremities are normal bilaterally.  Toes:  The toes are downgoing bilaterally.  Clonus:  Clonus is absent.      Assessment/Plan: 30 year old with Intractable migraine.   Failed topamax and nortriptyline and venlafaine She has reactions to steroids, cant give a taper.  Recommend botox of participation in our clinical trial She is going to think about her choices and get back to me. If she selects Botox, can start propranolol.  To prevent or relieve headaches, try the following: Cool Compress. Lie down and place a cool compress on your head.  Avoid headache triggers. If certain foods or odors seem to have triggered your migraines in the past, avoid them. A headache diary might help you identify triggers.  Include physical activity in your daily routine. Try a daily walk or other moderate aerobic exercise.  Manage stress. Find healthy ways to cope with the stressors, such as delegating tasks on your to-do list.  Practice relaxation techniques. Try deep breathing, yoga, massage and visualization.  Eat regularly. Eating regularly scheduled meals and maintaining a healthy diet might help prevent headaches. Also, drink plenty of fluids.  Follow a regular sleep schedule. Sleep deprivation might contribute to headaches Consider biofeedback. With this mind-body technique, you learn to control certain bodily functions - such as muscle tension, heart rate and blood pressure - to prevent headaches or reduce headache pain.    Proceed to emergency room if you experience new or worsening symptoms or symptoms do not resolve, if you have new neurologic symptoms or if headache is severe, or for any concerning symptom.    Naomie Dean, MD  [facility] [address] [place], [place] [address]  Phone [phone] Fax [phone]  A total of 30 minutes was spent face-to-face with this patient. Over half this time was  spent on counseling patient on the migraine, medication overuse headache, fatigue and stress diagnosis and different diagnostic and therapeutic options available.

## 2016-04-27 DIAGNOSIS — G43719 Chronic migraine without aura, intractable, without status migrainosus: Secondary | ICD-10-CM | POA: Insufficient documentation

## 2016-05-01 ENCOUNTER — Encounter: Payer: Self-pay | Admitting: Neurology

## 2016-05-01 ENCOUNTER — Telehealth: Payer: Self-pay | Admitting: Neurology

## 2016-05-01 NOTE — Telephone Encounter (Signed)
Printed out phone note per Dr Lucia GaskinsAhern request. Dr Lucia GaskinsAhern is going to speak to Dr Epimenio FootSater about pt.

## 2016-05-01 NOTE — Telephone Encounter (Signed)
Patient reports constant HA . sts provider has talked about a research medication she could try. Advised that the nurse would call

## 2016-05-02 ENCOUNTER — Ambulatory Visit: Payer: Self-pay | Admitting: Neurology

## 2016-05-02 ENCOUNTER — Encounter: Payer: Self-pay | Admitting: Neurology

## 2016-05-02 ENCOUNTER — Telehealth: Payer: Self-pay | Admitting: *Deleted

## 2016-05-02 ENCOUNTER — Other Ambulatory Visit: Payer: Self-pay | Admitting: Neurology

## 2016-05-02 MED ORDER — PROPRANOLOL HCL ER 60 MG PO CP24: 60.0000 mg | ORAL_CAPSULE | Freq: Every day | ORAL | Status: DC

## 2016-05-02 MED ORDER — RIZATRIPTAN BENZOATE 10 MG PO TBDP: ORAL_TABLET | ORAL | Status: DC

## 2016-05-02 MED ORDER — ONDANSETRON 4 MG PO TBDP: ORAL_TABLET | ORAL | Status: DC

## 2016-05-02 NOTE — Telephone Encounter (Signed)
Called pt. She will come for migraine cocktail with Inetta Fermoina in intrafusion. She will be to office before 12pm. She also spoke to Dr Lucia GaskinsAhern on phone.

## 2016-05-07 ENCOUNTER — Other Ambulatory Visit: Payer: Self-pay | Admitting: Neurology

## 2016-05-07 MED ORDER — GABAPENTIN 300 MG PO CAPS: 300.0000 mg | ORAL_CAPSULE | Freq: Three times a day (TID) | ORAL | Status: DC

## 2016-05-08 ENCOUNTER — Other Ambulatory Visit: Payer: Self-pay | Admitting: Neurology

## 2016-05-13 ENCOUNTER — Emergency Department (HOSPITAL_COMMUNITY): Payer: 59

## 2016-05-13 ENCOUNTER — Encounter (HOSPITAL_COMMUNITY): Payer: Self-pay | Admitting: *Deleted

## 2016-05-13 ENCOUNTER — Emergency Department (HOSPITAL_COMMUNITY)
Admission: EM | Admit: 2016-05-13 | Discharge: 2016-05-13 | Disposition: A | Discharge status: Home / Self Care | Payer: 59 | Attending: Emergency Medicine | Admitting: Emergency Medicine

## 2016-05-13 DIAGNOSIS — Z79899 Other long term (current) drug therapy: Secondary | ICD-10-CM | POA: Insufficient documentation

## 2016-05-13 DIAGNOSIS — R55 Syncope and collapse: Secondary | ICD-10-CM | POA: Diagnosis not present

## 2016-05-13 DIAGNOSIS — R10A0 Flank pain, unspecified side: Secondary | ICD-10-CM

## 2016-05-13 DIAGNOSIS — R079 Chest pain, unspecified: Secondary | ICD-10-CM

## 2016-05-13 DIAGNOSIS — F17210 Nicotine dependence, cigarettes, uncomplicated: Secondary | ICD-10-CM | POA: Diagnosis not present

## 2016-05-13 DIAGNOSIS — R109 Unspecified abdominal pain: Secondary | ICD-10-CM

## 2016-05-13 DIAGNOSIS — R1011 Right upper quadrant pain: Secondary | ICD-10-CM | POA: Diagnosis present

## 2016-05-13 LAB — COMPREHENSIVE METABOLIC PANEL
ALT: 31 U/L (ref 14–54)
AST: 28 U/L (ref 15–41)
Albumin: 4.3 g/dL (ref 3.5–5.0)
Alkaline Phosphatase: 71 U/L (ref 38–126)
Anion gap: 9 (ref 5–15)
BILIRUBIN TOTAL: 0.4 mg/dL (ref 0.3–1.2)
BUN: 7 mg/dL (ref 6–20)
CHLORIDE: 106 mmol/L (ref 101–111)
CO2: 25 mmol/L (ref 22–32)
Calcium: 9.2 mg/dL (ref 8.9–10.3)
Creatinine, Ser: 0.74 mg/dL (ref 0.44–1.00)
Glucose, Bld: 89 mg/dL (ref 65–99)
POTASSIUM: 3.9 mmol/L (ref 3.5–5.1)
Sodium: 140 mmol/L (ref 135–145)
Total Protein: 7.2 g/dL (ref 6.5–8.1)

## 2016-05-13 LAB — URINALYSIS, ROUTINE W REFLEX MICROSCOPIC
BILIRUBIN URINE: NEGATIVE
Glucose, UA: NEGATIVE mg/dL
KETONES UR: NEGATIVE mg/dL
NITRITE: NEGATIVE
PROTEIN: NEGATIVE mg/dL
Specific Gravity, Urine: 1.003 — ABNORMAL LOW (ref 1.005–1.030)
pH: 7.5 (ref 5.0–8.0)

## 2016-05-13 LAB — CBC
HCT: 39.6 % (ref 36.0–46.0)
Hemoglobin: 13.5 g/dL (ref 12.0–15.0)
MCH: 30.5 pg (ref 26.0–34.0)
MCHC: 34.1 g/dL (ref 30.0–36.0)
MCV: 89.6 fL (ref 78.0–100.0)
PLATELETS: 259 10*3/uL (ref 150–400)
RBC: 4.42 MIL/uL (ref 3.87–5.11)
RDW: 12.7 % (ref 11.5–15.5)
WBC: 6.4 10*3/uL (ref 4.0–10.5)

## 2016-05-13 LAB — URINE MICROSCOPIC-ADD ON

## 2016-05-13 LAB — I-STAT BETA HCG BLOOD, ED (MC, WL, AP ONLY)

## 2016-05-13 LAB — D-DIMER, QUANTITATIVE (NOT AT ARMC)

## 2016-05-13 LAB — LIPASE, BLOOD: LIPASE: 24 U/L (ref 11–51)

## 2016-05-13 LAB — CBG MONITORING, ED: GLUCOSE-CAPILLARY: 89 mg/dL (ref 65–99)

## 2016-05-13 MED ORDER — HYDROMORPHONE HCL 1 MG/ML IJ SOLN
0.5000 mg | Freq: Once | INTRAMUSCULAR | Status: AC
  Administered 2016-05-13: 0.5 mg via INTRAVENOUS
  Filled 2016-05-13: qty 1

## 2016-05-13 MED ORDER — SODIUM CHLORIDE 0.9 % IV BOLUS (SEPSIS)
1000.0000 mL | Freq: Once | INTRAVENOUS | Status: AC
  Administered 2016-05-13: 1000 mL via INTRAVENOUS

## 2016-05-13 MED ORDER — METOCLOPRAMIDE HCL 5 MG/ML IJ SOLN
10.0000 mg | Freq: Once | INTRAMUSCULAR | Status: AC
  Administered 2016-05-13: 10 mg via INTRAVENOUS
  Filled 2016-05-13: qty 2

## 2016-05-13 NOTE — ED Notes (Signed)
US at bedside

## 2016-05-13 NOTE — ED Notes (Signed)
Patient transported to X-ray 

## 2016-05-13 NOTE — ED Notes (Signed)
Per EMS, pt transported from UC, c/o RUQ  Pain and witnessed syncope.  Pt is A&Ox 4.  Pt also reports nausea without vomiting.  Received 4mg  zofran en route.

## 2016-05-13 NOTE — Discharge Instructions (Signed)
You were seen and evaluated today for your upper abdominal/lower chest pain. The exact cause at this time is not clear. Please follow-up with your primary care physician for reevaluation.  Abdominal Pain, Adult Many things can cause abdominal pain. Usually, abdominal pain is not caused by a disease and will improve without treatment. It can often be observed and treated at home. Your health care provider will do a physical exam and possibly order blood tests and X-rays to help determine the seriousness of your pain. However, in many cases, more time must pass before a clear cause of the pain can be found. Before that point, your health care provider may not know if you need more testing or further treatment. HOME CARE INSTRUCTIONS Monitor your abdominal pain for any changes. The following actions may help to alleviate any discomfort you are experiencing:  Only take over-the-counter or prescription medicines as directed by your health care provider.  Do not take laxatives unless directed to do so by your health care provider.  Try a clear liquid diet (broth, tea, or water) as directed by your health care provider. Slowly move to a bland diet as tolerated. SEEK MEDICAL CARE IF:  You have unexplained abdominal pain.  You have abdominal pain associated with nausea or diarrhea.  You have pain when you urinate or have a bowel movement.  You experience abdominal pain that wakes you in the night.  You have abdominal pain that is worsened or improved by eating food.  You have abdominal pain that is worsened with eating fatty foods.  You have a fever. SEEK IMMEDIATE MEDICAL CARE IF:  Your pain does not go away within 2 hours.  You keep throwing up (vomiting).  Your pain is felt only in portions of the abdomen, such as the right side or the left lower portion of the abdomen.  You pass bloody or black tarry stools. MAKE SURE YOU:  Understand these instructions.  Will watch your  condition.  Will get help right away if you are not doing well or get worse.   This information is not intended to replace advice given to you by your health care provider. Make sure you discuss any questions you have with your health care provider.   Document Released: 09/03/2005 Document Revised: 08/15/2015 Document Reviewed: 08/03/2013 Elsevier Interactive Patient Education [employer]2016 [employer]

## 2016-05-15 NOTE — ED Provider Notes (Signed)
CSN: [id number]     Arrival date & time 05/13/16  1417 History   First MD Initiated Contact with Patient 05/13/16 1432     Chief Complaint  Patient presents with  . Abdominal Pain  . Loss of Consciousness     (Consider location/radiation/quality/duration/timing/severity/associated sxs/prior Treatment) HPI Comments: 30 year old female with history of migraines presents for pain. The patient reports that she was at work today when she suddenly developed right upper quadrant abdominal/right lower chest pain. She apparently had a sudden increase in the pain and had a syncopal episode. Patient denies ever having pain like this before. She currently denies any shortness of breath. She said it feels like the pain is in her right lower chest area. Denies vomiting but does report some nausea. No constipation or diarrhea. No fevers or chills. She reports an normal appetite. She reports feeling well earlier in the day. No pelvic pain, discharge.  Patient is a 30 y.o. female presenting with abdominal pain and syncope.  Abdominal Pain Associated symptoms: chest pain and nausea   Associated symptoms: no chills, no diarrhea, no dysuria, no fatigue, no fever, no shortness of breath, no vaginal bleeding, no vaginal discharge and no vomiting   Loss of Consciousness Associated symptoms: chest pain and nausea   Associated symptoms: no dizziness, no fever, no headaches, no palpitations, no shortness of breath and no vomiting     Past Medical History  Diagnosis Date  . Migraine    Past Surgical History  Procedure Laterality Date  . Bulging disc      cervical  . Appendectomy    . Tympanostomy tube placement    . Tonsillectomy and adenoidectomy      x2  . Tonsillectomy     Family History  Problem Relation Age of Onset  . Cirrhosis Mother   . Heart failure Father   . Cancer Sister   . High blood pressure Brother   . Cervical cancer Sister   . Migraines Neg Hx    Social History  Substance Use  Topics  . Smoking status: Current Every Day Smoker -- 0.00 packs/day    Types: Cigarettes  . Smokeless tobacco: Never Used  . Alcohol Use: 0.0 oz/week    0 Standard drinks or equivalent per week     Comment: social   OB History    No data available     Review of Systems  Constitutional: Negative for fever, chills and fatigue.  HENT: Negative for congestion, postnasal drip, rhinorrhea and sinus pressure.   Eyes: Negative for visual disturbance.  Respiratory: Negative for chest tightness, shortness of breath and wheezing.   Cardiovascular: Positive for chest pain and syncope. Negative for palpitations and leg swelling.  Gastrointestinal: Positive for nausea and abdominal pain. Negative for vomiting and diarrhea.  Genitourinary: Negative for dysuria, urgency, flank pain, vaginal bleeding, vaginal discharge, vaginal pain and pelvic pain.  Musculoskeletal: Negative for myalgias and back pain.  Skin: Negative for rash.  Neurological: Positive for syncope. Negative for dizziness, light-headedness, numbness and headaches.  Hematological: Does not bruise/bleed easily.      Allergies  Propranolol  Home Medications   Prior to Admission medications   Medication Sig Start Date End Date Taking? Authorizing Provider  gabapentin (NEURONTIN) 300 MG capsule Take 1 capsule (300 mg total) by mouth 3 (three) times daily. 05/07/16  Yes Anson Fret, MD  ondansetron (ZOFRAN-ODT) 4 MG disintegrating tablet Take for migraine or nausea. May also take with Maxalt. Patient taking differently: Take 4  mg by mouth every 8 (eight) hours as needed for nausea or vomiting. Take for migraine or nausea. May also take with Maxalt. 05/02/16  Yes Anson Fret, MD  rizatriptan (MAXALT-MLT) 10 MG disintegrating tablet Take at the onset of migraine immediately. May repeat in 2 hours once if needed. Patient taking differently: Take 10 mg by mouth every 2 (two) hours as needed for migraine. Take at the onset of  migraine immediately. May repeat in 2 hours once if needed. 05/02/16  Yes Anson Fret, MD  tiZANidine (ZANAFLEX) 2 MG tablet TAKE 1 TABLET (2 MG TOTAL) BY MOUTH 3 (THREE) TIMES DAILY. Patient taking differently: TAKE 1 TABLET (2 MG TOTAL) BY MOUTH 3 (THREE) TIMES DAILY as needed for pain 05/08/16  Yes Anson Fret, MD  venlafaxine XR (EFFEXOR-XR) 150 MG 24 hr capsule Take 1 capsule (150 mg total) by mouth daily with breakfast. 04/08/16  Yes Anson Fret, MD   BP 133/69 mmHg  Pulse 75  Temp(Src) 98.6 F (37 C) (Oral)  Resp 19  SpO2 98%  LMP 05/05/2016 Physical Exam  Constitutional: She is oriented to person, place, and time. She appears well-developed and well-nourished. No distress.  HENT:  Head: Normocephalic and atraumatic.  Right Ear: External ear normal.  Left Ear: External ear normal.  Nose: Nose normal.  Mouth/Throat: Oropharynx is clear and moist. No oropharyngeal exudate.  Eyes: EOM are normal. Pupils are equal, round, and reactive to light.  Neck: Normal range of motion. Neck supple.  Cardiovascular: Normal rate, regular rhythm, normal heart sounds and intact distal pulses.   No murmur heard. Pulmonary/Chest: Effort normal. No respiratory distress. She has no wheezes. She has no rales.  Abdominal: Soft. She exhibits no distension. There is tenderness in the right upper quadrant.  Musculoskeletal: Normal range of motion. She exhibits no edema or tenderness.  Neurological: She is alert and oriented to person, place, and time. She has normal strength. No cranial nerve deficit or sensory deficit. She exhibits normal muscle tone. Coordination normal.  Skin: Skin is warm and dry. No rash noted. She is not diaphoretic.  Vitals reviewed.   ED Course  Procedures (including critical care time) Labs Review Labs Reviewed  URINALYSIS, ROUTINE W REFLEX MICROSCOPIC (NOT AT [facility]) - Abnormal; Notable for the following:    Specific Gravity, Urine 1.003 (*)    Hgb urine dipstick  TRACE (*)    Leukocytes, UA TRACE (*)    All other components within normal limits  URINE MICROSCOPIC-ADD ON - Abnormal; Notable for the following:    Squamous Epithelial / LPF 6-30 (*)    Bacteria, UA FEW (*)    All other components within normal limits  CBC  LIPASE, BLOOD  COMPREHENSIVE METABOLIC PANEL  D-DIMER, QUANTITATIVE (NOT AT [facility])  CBG MONITORING, ED  I-STAT BETA HCG BLOOD, ED ([facility], [facility], [facility] ONLY)    Imaging Review Dg Chest 2 View  05/13/2016  CLINICAL DATA:  Syncopal episode today. Left chest pain and shortness of breath. EXAM: CHEST  2 VIEW COMPARISON:  12/12/2014 FINDINGS: The heart size and mediastinal contours are within normal limits. Both lungs are clear. No evidence of pneumothorax or pleural effusion. The visualized skeletal structures are unremarkable. IMPRESSION: No active cardiopulmonary disease. Electronically Signed   By: Myles Rosenthal M.D.   On: 05/13/2016 15:26   US Abdomen Limited  05/13/2016  ADDENDUM REPORT: 05/13/2016 17:37 ADDENDUM: Gallbladder: The gallbladder is thin walled with no pericholecystic fluid. No echogenic gallstones identified. Negative sonographic  Murphy's sign Impression:  Normal gallbladder, common bile duct, and liver. Findings conveyed toEMILY NGUYEN on 05/13/2016  at17:35. Electronically Signed   By: Genevive BiStewart  Edmunds M.D.   On: 05/13/2016 17:37  05/13/2016  CLINICAL DATA:  RIGHT upper quadrant abdominal pain 2 days EXAM: US ABDOMEN LIMITED - RIGHT UPPER QUADRANT COMPARISON:  None. FINDINGS: Gallbladder: Postcholecystectomy Common bile duct: Diameter: Normal at 2 mm Liver: No biliary duct dilatation.  Normal liver echogenicity. IMPRESSION: Normal RIGHT upper quadrant ultrasound postcholecystectomy. Electronically Signed: By: Genevive BiStewart  Edmunds M.D. On: 05/13/2016 16:13   Ct Renal Stone Study  05/13/2016  CLINICAL DATA:  Right upper quadrant abdomen pain and right flank pain. EXAM: CT ABDOMEN AND PELVIS WITHOUT CONTRAST TECHNIQUE: Multidetector CT imaging  of the abdomen and pelvis was performed following the standard protocol without IV contrast. COMPARISON:  October 17, 2014 FINDINGS: Lower chest: There is minimal atelectasis of right lung base. There is no pleural effusion or consolidation of the lung bases. Hepatobiliary: The liver and gallbladder are normal. The biliary ducts are normal. No mass visualized on this un-enhanced exam. Pancreas: No mass or inflammatory process identified on this un-enhanced exam. Spleen: Within normal limits in size. Adrenals/Urinary Tract: The adrenal glands are normal. Bilateral kidneys are normal. Right parapelvic renal cysts is identified. No evidence of nephrolithiasis or hydronephrosis. No definite mass visualized on this un-enhanced exam. The bladder is normal. Stomach/Bowel: Patient status post prior appendectomy. Moderate bowel content is identified throughout colon. No evidence of obstruction, inflammatory process, or abnormal fluid collections. Vascular/Lymphatic: No pathologically enlarged lymph nodes. No evidence of abdominal aortic aneurysm. Reproductive: No mass or other significant abnormality. Other: None. Musculoskeletal:  No suspicious bone lesions identified. IMPRESSION: Normal gallbladder. No hydronephrosis bilaterally. Patient status post prior appendectomy. Moderate bowel content is identified throughout colon. Question constipation. Electronically Signed   By: Sherian ReinWei-Chen  Lin M.D.   On: 05/13/2016 18:18   I have personally reviewed and evaluated these images and lab results as part of my medical decision-making.   EKG Interpretation   Date/Time:  Tuesday May 13 2016 15:06:50 EDT Ventricular Rate:  71 PR Interval:  149 QRS Duration: 97 QT Interval:  389 QTC Calculation: 423 R Axis:   66 Text Interpretation:  Sinus rhythm No significant change since last  tracing Confirmed by NGUYEN, EMILY ([id number]) on 05/13/2016 3:39:33 PM      MDM  Patient was seen and evaluated in stable condition. Laboratory  studies were unremarkable including d-dimer. EKG unremarkable. Right upper quadrant abdominal ultrasound unremarkable. Chest x-ray unremarkable. CT to evaluate for kidney stone also unremarkable. Patient was treated with IV fluids, antiemetics, pain medication. She did feel improved on reevaluation. The exact cause of her pain and episode of syncope urine clear at this time but there does not appear to be anything requiring further emergent evaluation. All results were discussed at length at bedside with patient who express understanding and agreement with plan for discharge. Patient was discharged home in stable condition with strict return precautions. Final diagnoses:  Flank pain  Chest pain, unspecified chest pain type    1. Never quadrant abdominal pain 2. Chest pain 3. Syncope    Leta BaptistEmily Roe Nguyen, MD 05/15/16 [phone]

## 2016-05-20 ENCOUNTER — Ambulatory Visit: Payer: 59 | Admitting: Neurology

## 2016-06-25 IMAGING — US US ABDOMEN LIMITED
1 series · 14 of 25 positions shown · non-contrast
Comparison: None.

ADDENDUM:
Gallbladder: The gallbladder is thin walled with no pericholecystic
fluid. No echogenic gallstones identified. Negative sonographic
Murphy's sign
CLINICAL DATA: RIGHT upper quadrant abdominal pain 2 days

EXAM:
US ABDOMEN LIMITED - RIGHT UPPER QUADRANT

[Series 1: us abdomen limited · 0.22mm/px · 14 of 54 slices shown]
[im 1/54]
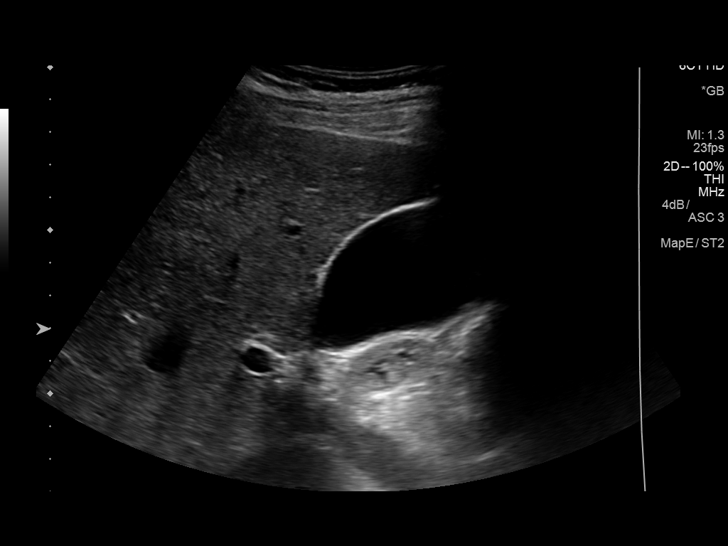
[im 5/54]
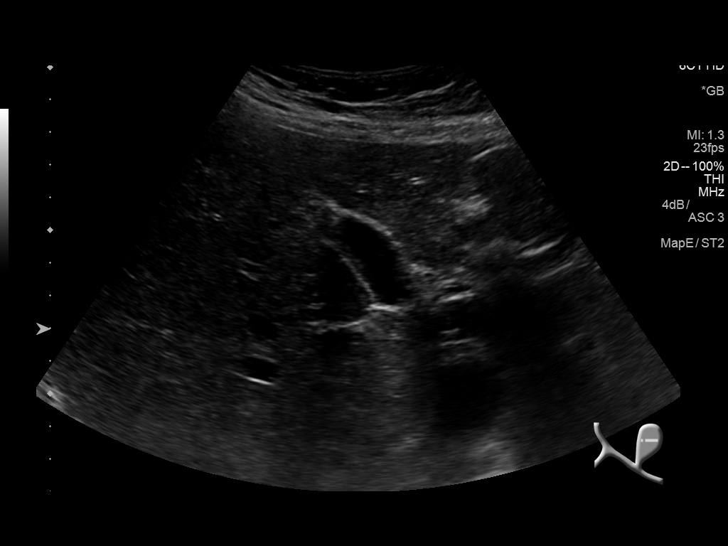
[im 9/54]
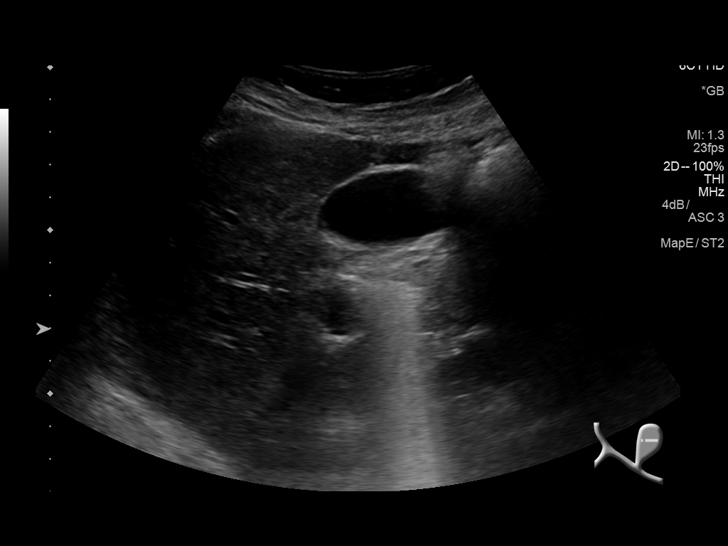
[im 14/54]
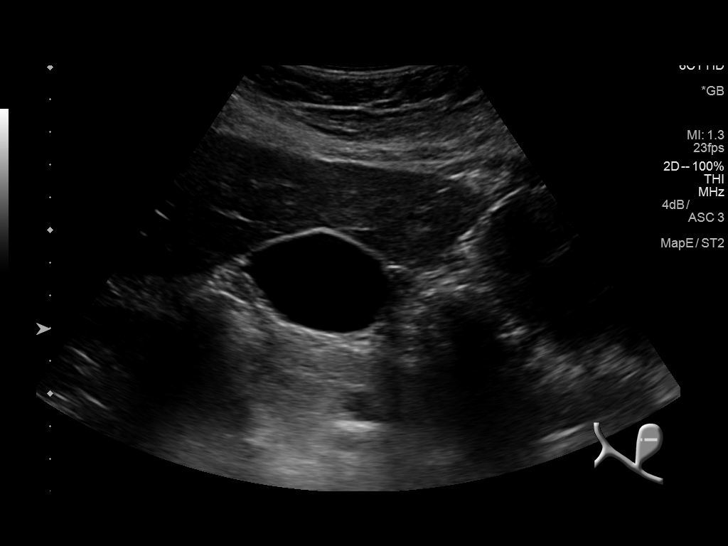
[im 18/54]
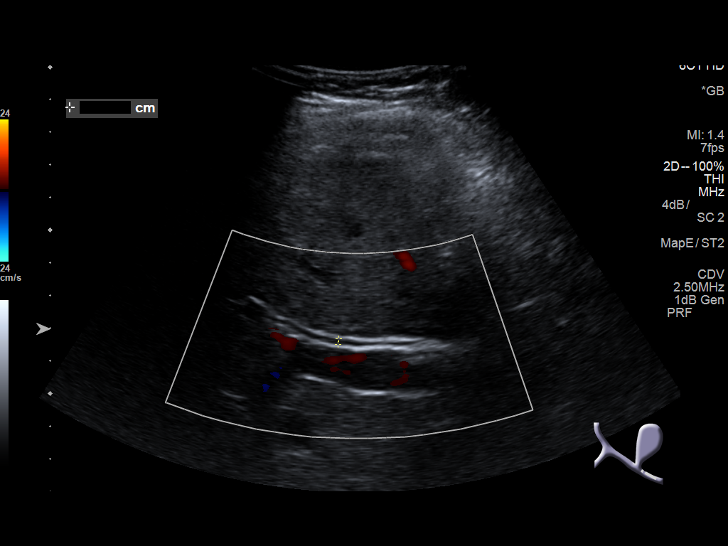
[im 20/54]
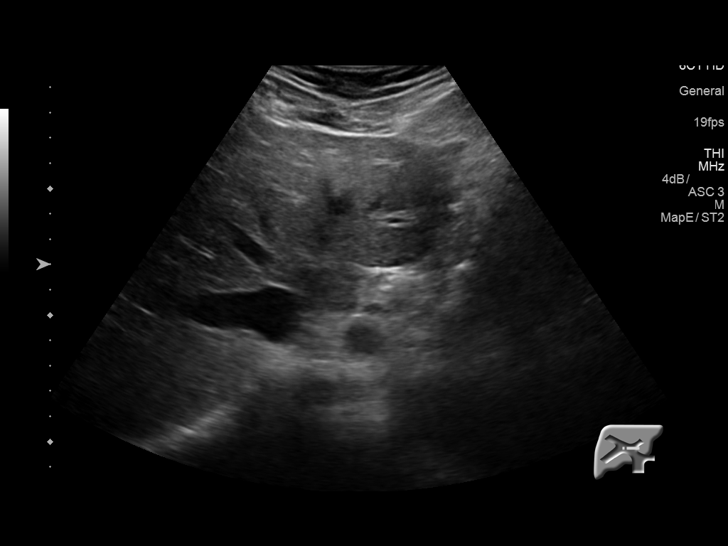
[im 25/54]
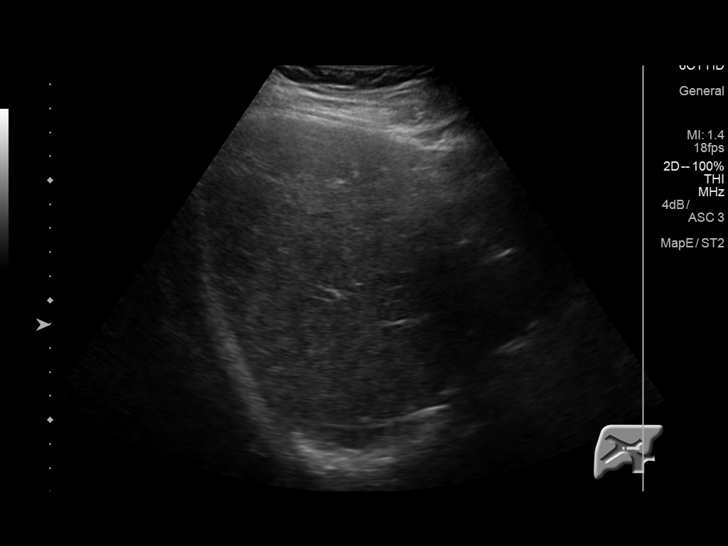
[im 29/54]
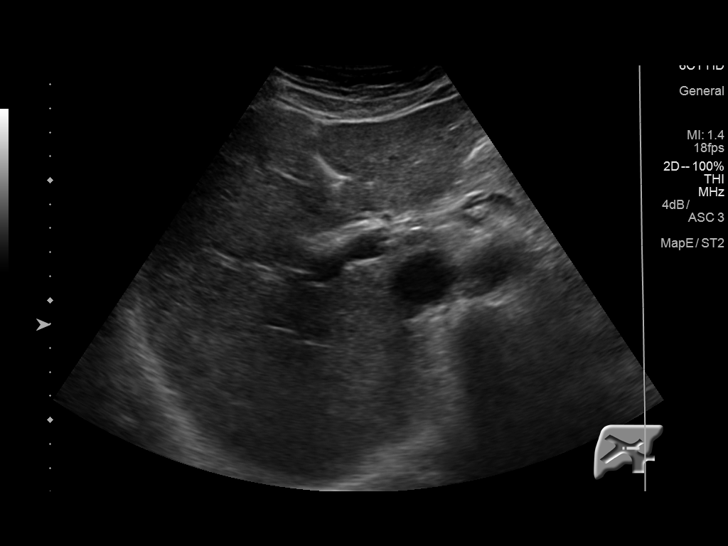
[im 34/54]
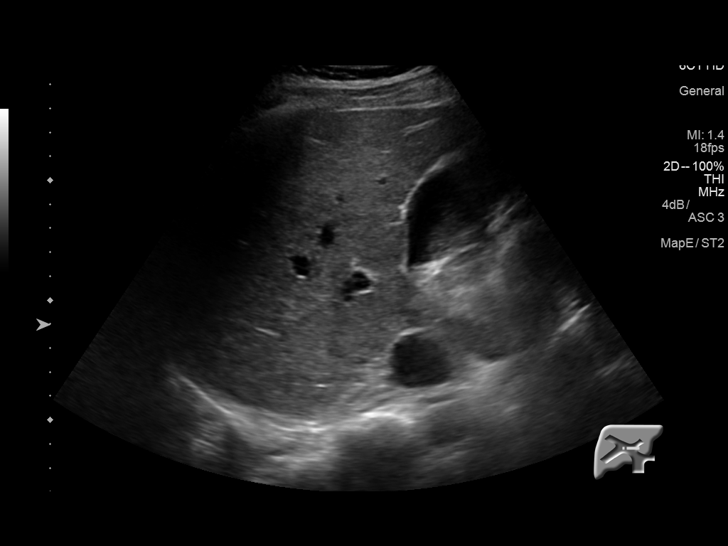
[im 36/54]
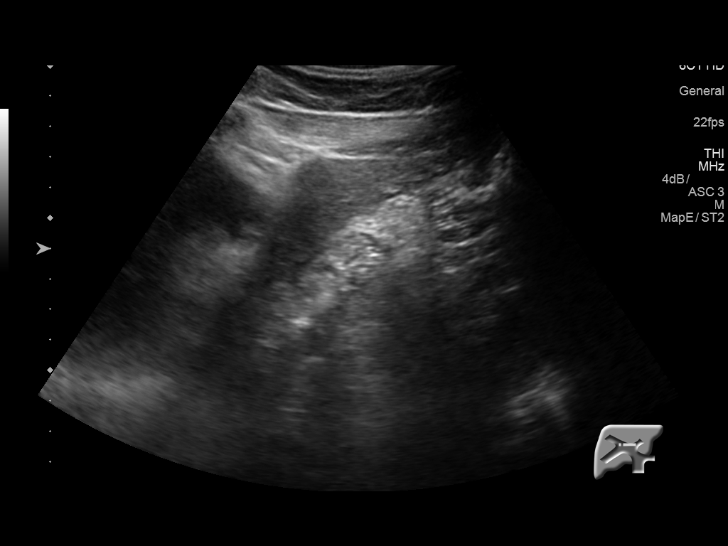
[im 40/54]
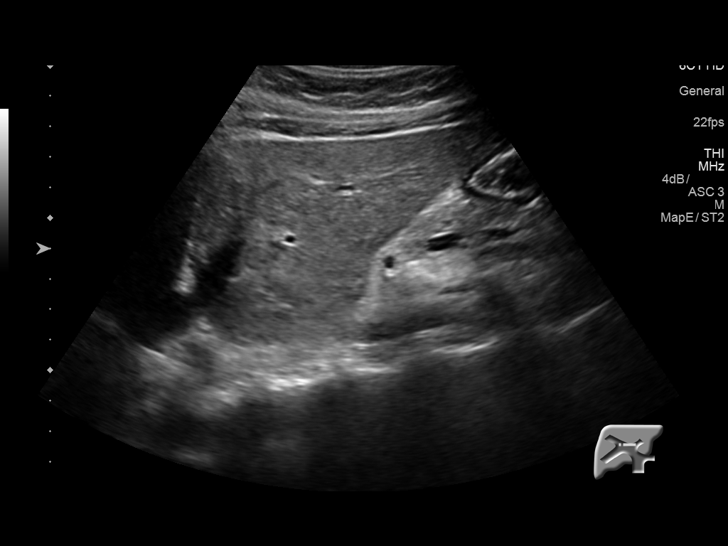
[im 45/54]
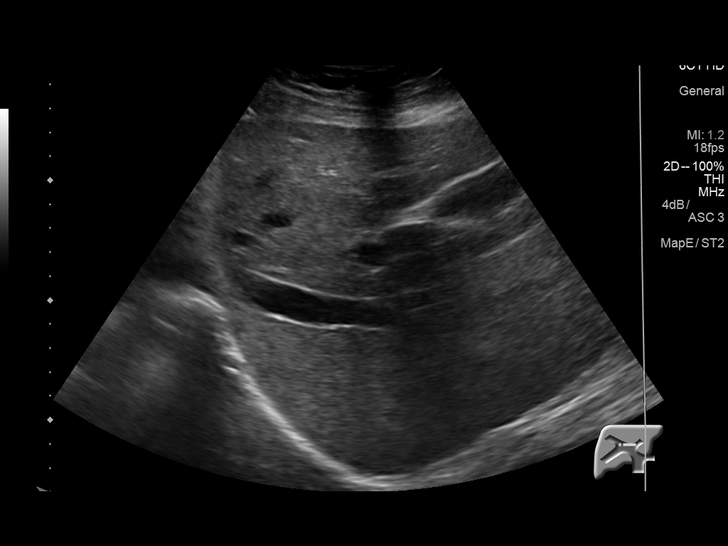
[im 49/54]
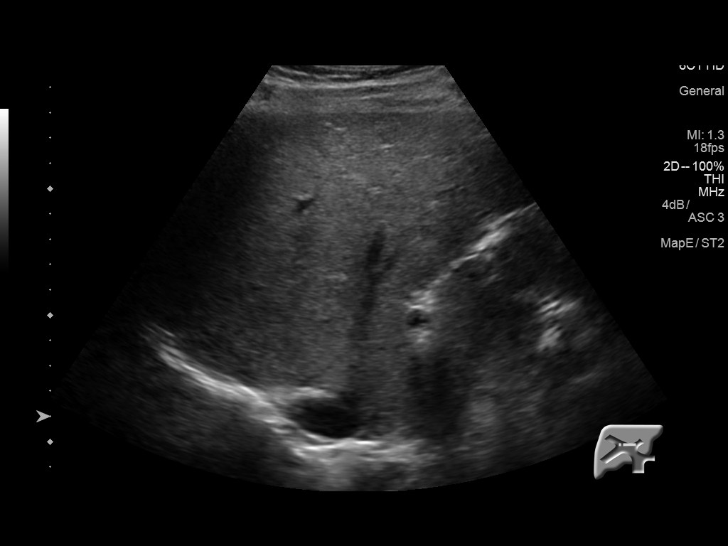
[im 54/54]
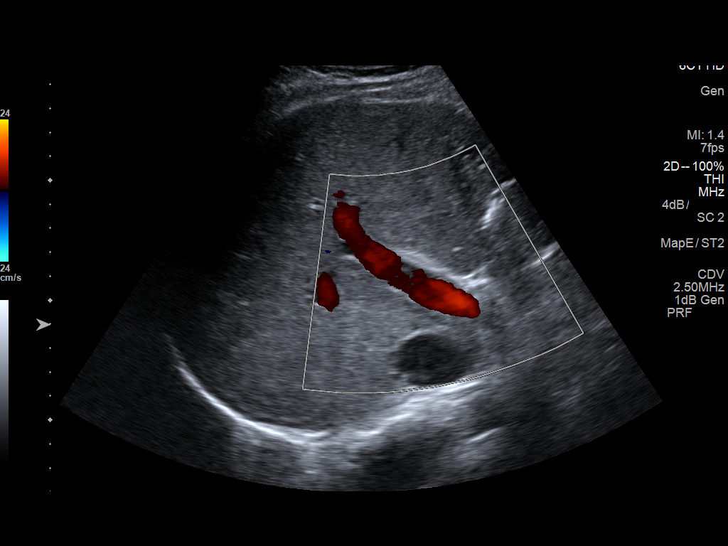

[14 of 25 positions shown; findings below may reference images not displayed]

IMPRESSION: Normal gallbladder, common bile duct, and liver.

Findings conveyed Jhon Z Reye on 05/13/2016  at[DATE].
FINDINGS: Gallbladder:

Postcholecystectomy

Common bile duct:

Diameter: Normal at 2 mm

Liver:

No biliary duct dilatation.  Normal liver echogenicity.
IMPRESSION: Normal RIGHT upper quadrant ultrasound postcholecystectomy.

## 2016-07-21 ENCOUNTER — Encounter (HOSPITAL_COMMUNITY): Payer: Self-pay | Admitting: Emergency Medicine

## 2016-07-21 ENCOUNTER — Emergency Department (HOSPITAL_COMMUNITY)
Admission: EM | Admit: 2016-07-21 | Discharge: 2016-07-21 | Disposition: A | Discharge status: Home / Self Care | Payer: BLUE CROSS/BLUE SHIELD | Attending: Emergency Medicine | Admitting: Emergency Medicine

## 2016-07-21 DIAGNOSIS — Z79899 Other long term (current) drug therapy: Secondary | ICD-10-CM | POA: Insufficient documentation

## 2016-07-21 DIAGNOSIS — R197 Diarrhea, unspecified: Secondary | ICD-10-CM | POA: Diagnosis not present

## 2016-07-21 DIAGNOSIS — G8929 Other chronic pain: Secondary | ICD-10-CM | POA: Diagnosis not present

## 2016-07-21 DIAGNOSIS — R112 Nausea with vomiting, unspecified: Secondary | ICD-10-CM | POA: Diagnosis present

## 2016-07-21 DIAGNOSIS — R109 Unspecified abdominal pain: Secondary | ICD-10-CM

## 2016-07-21 DIAGNOSIS — R1084 Generalized abdominal pain: Secondary | ICD-10-CM | POA: Diagnosis not present

## 2016-07-21 DIAGNOSIS — F17210 Nicotine dependence, cigarettes, uncomplicated: Secondary | ICD-10-CM | POA: Diagnosis not present

## 2016-07-21 LAB — URINE MICROSCOPIC-ADD ON

## 2016-07-21 LAB — URINALYSIS, ROUTINE W REFLEX MICROSCOPIC
Bilirubin Urine: NEGATIVE
Glucose, UA: NEGATIVE mg/dL
Ketones, ur: NEGATIVE mg/dL
Nitrite: NEGATIVE
PROTEIN: NEGATIVE mg/dL
SPECIFIC GRAVITY, URINE: 1.013 (ref 1.005–1.030)
pH: 7 (ref 5.0–8.0)

## 2016-07-21 LAB — LIPASE, BLOOD: Lipase: 26 U/L (ref 11–51)

## 2016-07-21 LAB — COMPREHENSIVE METABOLIC PANEL WITH GFR
ALT: 30 U/L (ref 14–54)
AST: 32 U/L (ref 15–41)
Albumin: 4.7 g/dL (ref 3.5–5.0)
Alkaline Phosphatase: 73 U/L (ref 38–126)
Anion gap: 6 (ref 5–15)
BUN: 11 mg/dL (ref 6–20)
CO2: 26 mmol/L (ref 22–32)
Calcium: 9.7 mg/dL (ref 8.9–10.3)
Chloride: 103 mmol/L (ref 101–111)
Creatinine, Ser: 0.69 mg/dL (ref 0.44–1.00)
GFR calc Af Amer: >60 mL/min
GFR calc non Af Amer: >60 mL/min
Glucose, Bld: 90 mg/dL (ref 65–99)
Potassium: 3.9 mmol/L (ref 3.5–5.1)
Sodium: 135 mmol/L (ref 135–145)
Total Bilirubin: 0.5 mg/dL (ref 0.3–1.2)
Total Protein: 7.9 g/dL (ref 6.5–8.1)

## 2016-07-21 LAB — I-STAT BETA HCG BLOOD, ED (MC, WL, AP ONLY): I-stat hCG, quantitative: <5 m[IU]/mL (ref <5)

## 2016-07-21 LAB — CBC
HCT: 39.5 % (ref 36.0–46.0)
Hemoglobin: 14.5 g/dL (ref 12.0–15.0)
MCH: 33.1 pg (ref 26.0–34.0)
MCHC: 36.7 g/dL — ABNORMAL HIGH (ref 30.0–36.0)
MCV: 90.2 fL (ref 78.0–100.0)
PLATELETS: 274 10*3/uL (ref 150–400)
RBC: 4.38 MIL/uL (ref 3.87–5.11)
RDW: 12.8 % (ref 11.5–15.5)
WBC: 7 10*3/uL (ref 4.0–10.5)

## 2016-07-21 LAB — ABO/RH: ABO/RH(D): O POS

## 2016-07-21 LAB — TYPE AND SCREEN
ABO/RH(D): O POS
ANTIBODY SCREEN: NEGATIVE

## 2016-07-21 LAB — POC OCCULT BLOOD, ED: Fecal Occult Bld: NEGATIVE

## 2016-07-21 MED ORDER — FAMOTIDINE IN NACL 20-0.9 MG/50ML-% IV SOLN
20.0000 mg | Freq: Once | INTRAVENOUS | Status: AC
  Administered 2016-07-21: 20 mg via INTRAVENOUS
  Filled 2016-07-21: qty 50

## 2016-07-21 MED ORDER — METOCLOPRAMIDE HCL 10 MG PO TABS
10.0000 mg | ORAL_TABLET | Freq: Three times a day (TID) | ORAL | 0 refills | Status: DC | PRN
Start: 2016-07-21 — End: 2017-02-05

## 2016-07-21 MED ORDER — GI COCKTAIL ~~LOC~~
30.0000 mL | Freq: Once | ORAL | Status: AC
  Administered 2016-07-21: 30 mL via ORAL
  Filled 2016-07-21: qty 30

## 2016-07-21 MED ORDER — ONDANSETRON HCL 4 MG/2ML IJ SOLN
4.0000 mg | Freq: Once | INTRAMUSCULAR | Status: AC
  Administered 2016-07-21: 4 mg via INTRAVENOUS
  Filled 2016-07-21: qty 2

## 2016-07-21 MED ORDER — METOCLOPRAMIDE HCL 5 MG/ML IJ SOLN
10.0000 mg | Freq: Once | INTRAMUSCULAR | Status: AC
  Administered 2016-07-21: 10 mg via INTRAVENOUS
  Filled 2016-07-21: qty 2

## 2016-07-21 MED ORDER — SODIUM CHLORIDE 0.9 % IV BOLUS (SEPSIS)
1000.0000 mL | Freq: Once | INTRAVENOUS | Status: AC
  Administered 2016-07-21: 1000 mL via INTRAVENOUS

## 2016-07-21 MED ORDER — KETOROLAC TROMETHAMINE 30 MG/ML IJ SOLN
30.0000 mg | Freq: Once | INTRAMUSCULAR | Status: AC
  Administered 2016-07-21: 30 mg via INTRAVENOUS
  Filled 2016-07-21: qty 1

## 2016-07-21 NOTE — Discharge Instructions (Signed)
Read the information below.  Use the prescribed medication as directed.  Please discuss all new medications with your pharmacist.  You may return to the Emergency Department at any time for worsening condition or any new symptoms that concern you.   If you develop high fevers, worsening abdominal pain, uncontrolled vomiting, or are unable to tolerate fluids by mouth, return to the ER for a recheck.  ° °

## 2016-07-21 NOTE — ED Notes (Signed)
Discharge instructions, follow up care, and rx x1 reviewed with patient. Patient verbalized understanding. 

## 2016-07-21 NOTE — ED Provider Notes (Signed)
[facility]-EMERGENCY DEPT Provider Note   CSN: [id number] Arrival date & time: 07/21/16  1250     History   Chief Complaint Chief Complaint  Patient presents with  . Abdominal Pain    HPI Carrie CollardJamie Reed is a 30 y.o. female.  HPI   Patient with hx chronic upper abdominal pain presents with abdominal pain, N/V/D, fatigue x 4 days.  Has had mostly nausea and diarrhea that is watery, 4-5 times daily, and diffuse burning abdominal pain that is constant over the past 4 days.  Has had occasional vomiting.  This morning she had stings of mucus and blood in her stool.  The stool is otherwise dark "nearly black."  [facility] with [facility]oscopy (03/2015) and no diagnosis for chronic daily upper abdominal pain, and had an appointment today but was in the ED with her husband and was advised by GI office to stay and be seen here.  Has had chills twice in two days.  Denies urinary or vaginal symptoms, chest pain, SOB, cough.  Takes no medication for her symptoms.     Past Medical History:  Diagnosis Date  . Migraine     Patient Active Problem List   Diagnosis Date Noted  . Intractable chronic migraine without aura and without status migrainosus 04/27/2016  . Intractable vascular headache 08/02/2015  . Pulsatile tinnitus of left ear 08/02/2015  . Blurry vision 08/02/2015  . Worsening headaches 08/02/2015    Past Surgical History:  Procedure Laterality Date  . APPENDECTOMY    . bulging disc     cervical  . TONSILLECTOMY    . TONSILLECTOMY AND ADENOIDECTOMY     x2  . TYMPANOSTOMY TUBE PLACEMENT      OB History    No data available       Home Medications    Prior to Admission medications   Medication Sig Start Date End Date Taking? Authorizing Provider  gabapentin (NEURONTIN) 300 MG capsule Take 1 capsule (300 mg total) by mouth 3 (three) times daily. 05/07/16  Yes Anson FretAntonia B Ahern, MD  tiZANidine (ZANAFLEX) 2 MG tablet Take 2 mg by mouth 3 (three) times daily as needed  (for migraines).   Yes Historical Provider, MD  venlafaxine XR (EFFEXOR-XR) 150 MG 24 hr capsule Take 1 capsule (150 mg total) by mouth daily with breakfast. 04/08/16  Yes Anson FretAntonia B Ahern, MD  metoCLOPramide (REGLAN) 10 MG tablet Take 1 tablet (10 mg total) by mouth every 8 (eight) hours as needed for nausea or vomiting. 07/21/16   Trixie DredgeEmily Jamez Ambrocio, PA-C    Family History Family History  Problem Relation Age of Onset  . Cirrhosis Mother   . Heart failure Father   . Cancer Sister   . High blood pressure Brother   . Cervical cancer Sister   . Migraines Neg Hx     Social History Social History  Substance Use Topics  . Smoking status: Current Every Day Smoker    Packs/day: 0.25    Types: Cigarettes  . Smokeless tobacco: Never Used  . Alcohol use 0.0 oz/week     Comment: social     Allergies   Propranolol   Review of Systems Review of Systems  All other systems reviewed and are negative.    Physical Exam Updated Vital Signs BP 104/66   Pulse 80   Temp 98.8 F (37.1 C) (Oral)   Resp 18   Ht 5\' 7"  (1.702 m)   Wt 81.6 kg   LMP 07/07/2016  SpO2 100%   BMI 28.19 kg/m   Physical Exam  Constitutional: She appears well-developed and well-nourished. No distress.  HENT:  Head: Normocephalic and atraumatic.  Neck: Neck supple.  Cardiovascular: Normal rate and regular rhythm.   Pulmonary/Chest: Effort normal and breath sounds normal. No respiratory distress. She has no wheezes. She has no rales.  Abdominal: Soft. She exhibits no distension. There is tenderness (diffuse ). There is no rebound and no guarding.  Genitourinary: Rectal exam shows no mass and anal tone normal.  Genitourinary Comments: Scant light yellow/brown stool on glove.  No frank blood.    Neurological: She is alert.  Skin: She is not diaphoretic.  Nursing note and vitals reviewed.    ED Treatments / Results  Labs (all labs ordered are listed, but only abnormal results are displayed) Labs Reviewed  CBC -  Abnormal; Notable for the following:       Result Value   MCHC 36.7 (*)    All other components within normal limits  URINALYSIS, ROUTINE W REFLEX MICROSCOPIC (NOT AT [facility]) - Abnormal; Notable for the following:    Hgb urine dipstick TRACE (*)    Leukocytes, UA TRACE (*)    All other components within normal limits  URINE MICROSCOPIC-ADD ON - Abnormal; Notable for the following:    Squamous Epithelial / LPF 6-30 (*)    Bacteria, UA FEW (*)    All other components within normal limits  LIPASE, BLOOD  COMPREHENSIVE METABOLIC PANEL  I-STAT BETA HCG BLOOD, ED ([facility], [facility], [facility] ONLY)  POC OCCULT BLOOD, ED  TYPE AND SCREEN  ABO/RH    EKG  EKG Interpretation None       Radiology No results found.  Procedures Procedures (including critical care time)  Medications Ordered in ED Medications  famotidine (PEPCID) IVPB 20 mg premix (20 mg Intravenous New Bag/Given 07/21/16 1552)  sodium chloride 0.9 % bolus 1,000 mL (1,000 mLs Intravenous New Bag/Given 07/21/16 1435)  ondansetron (ZOFRAN) injection 4 mg (4 mg Intravenous Given 07/21/16 1435)  ketorolac (TORADOL) 30 MG/ML injection 30 mg (30 mg Intravenous Given 07/21/16 1435)  gi cocktail (Maalox,Lidocaine,Donnatal) (30 mLs Oral Given 07/21/16 1552)  metoCLOPramide (REGLAN) injection 10 mg (10 mg Intravenous Given 07/21/16 1551)     Initial Impression / Assessment and Plan / ED Course  I have reviewed the triage vital signs and the nursing notes.  Pertinent labs & imaging results that were available during my care of the patient were reviewed by me and considered in my medical decision making (see chart for details).  Clinical Course    Afebrile, nontoxic patient with chronic abdominal pain x 2 years and 4 days of vomiting and diarrhea, today with small amount of blood in stool.  Feeling tired.  Pt noted being very sleepy - Has chronic problems with sleeping (has many children, husband snores, etc).   Skipped GI appointment today because  she was in ED, instead was seen here.   Labs, UA remarkable only for chronic-appearing hematuria, pt made aware but states she has never had further evaluation for this.  I doubt UTI.  Recommended follow up with urology.   D/C home with reglan, GI follow up.  Discussed result, findings, treatment, and follow up  with patient.  Pt given return precautions.  Pt verbalizes understanding and agrees with plan.       Final Clinical Impressions(s) / ED Diagnoses   Final diagnoses:  Nausea vomiting and diarrhea  Chronic abdominal pain    New  Prescriptions New Prescriptions   METOCLOPRAMIDE (REGLAN) 10 MG TABLET    Take 1 tablet (10 mg total) by mouth every 8 (eight) hours as needed for nausea or vomiting.     Trixie Dredgemily Ethridge Sollenberger, PA-C 07/21/16 1612    Benjiman CoreNathan Pickering, MD 07/22/16 1357

## 2016-07-21 NOTE — ED Triage Notes (Signed)
Pt complaint of RUQ pain with n/v/d and bright red bleeding onset four days ago. Pt reports appointment with GI today but was here with family so office told pt to stay here related to blood in stool.

## 2016-10-09 ENCOUNTER — Other Ambulatory Visit: Payer: Self-pay | Admitting: Neurology

## 2016-12-23 ENCOUNTER — Other Ambulatory Visit: Payer: Self-pay | Admitting: Neurology

## 2016-12-23 NOTE — Telephone Encounter (Signed)
Called and spoke to pt. She verified that she is taking venlafaxine every day. She tried propranolol but had SE from this. Advised she had no f/u scheduled. Schedule f/u for 02/05/17 at 200pm, check in 130pm. Pt verbalized understanding.

## 2017-02-05 ENCOUNTER — Ambulatory Visit (INDEPENDENT_AMBULATORY_CARE_PROVIDER_SITE_OTHER): Payer: Managed Care, Other (non HMO) | Admitting: Neurology

## 2017-02-05 ENCOUNTER — Encounter: Payer: Self-pay | Admitting: Neurology

## 2017-02-05 VITALS — BP 142/78 | HR 85 | Ht 66.5 in | Wt 198.6 lb

## 2017-02-05 DIAGNOSIS — G43009 Migraine without aura, not intractable, without status migrainosus: Secondary | ICD-10-CM | POA: Diagnosis not present

## 2017-02-05 MED ORDER — GABAPENTIN (ONCE-DAILY) 600 MG PO TABS: 600.0000 mg | ORAL_TABLET | Freq: Every day | ORAL | 6 refills | Status: DC

## 2017-02-05 MED ORDER — ELETRIPTAN HYDROBROMIDE 40 MG PO TABS: 40.0000 mg | ORAL_TABLET | ORAL | 12 refills | Status: DC | PRN

## 2017-02-05 NOTE — Progress Notes (Signed)
[facility]    Provider:  Dr Lucia Gaskins Referring Provider: Haskel Khan Primary Care Physician:  Haskel Khan  CC: Migraine   Interval history 04/25/2016: Patient with migraines and multiple failures or side effects to migraine treatments returns for follow up. She has a migraine once a week, she has a dull ache more often. She has a difficult time staying asleep, a kid or a dog or her husband's snoring wakes her up often. She never gets consistent sleep. Discussed good sleep hygiene. 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 Can last the whole day. She tries to sleep.   Failed: Topiramate, Nortriptyline(fatigue), Venlafaxine, steroids (reaction), gabapentin, zofran, maxalt, tizanidine, reglan, propranolol, Topiramate ER(nausea,feel sick), Amitriptyline (sedation)  Interval history 04/25/2016: She has been taking the venlafaxine which is not helping. She has failed Topiramate, nortriptyline. She does not take OTC medicine, last excedrin 5 months ago. Headaches are daily, behind the left eye, has light sensitivity and needs sunglasses, sound sensitivity, lots of nausea, no vomiting. She has 30/30 headaches for the last year or longer, headaches with at least 10 being migrainous. Discussed Botox and clinical trials we are running currently. She stopped taking Excedrin daily.  Interval history 01/04/2016: Carrie Reed is a 31 y.o. female here as a referral from Dr. Laural Benes for migraines. The Trokendi made her feel sick, nausea. The nortriptyline has helped her sleep but it is making her feel hung over. She goes to bed between 830 and 11pm. She is not sleeping 8 hours. She is very fatigued during the day and the nortriptyline is making it worse. She has gained a lot of weight. She doesn't have an appetite. She makes herself eat. She is just not hungry. She stopped taking the nortriptyline. Headaches are in the evening and when she wakes in the morning. It  is very strong behind the left eye, it has woken her out of her sleep, her head pounds with her heartbeat, they can be severe. Headache is continuous. Gets more intense at night. She ha failed amitriptyline and Topamax due to side effects. She is taking excedrin migraine daily which can cause rebound headaches.  EEG, MRI of the brain/MRA of the head, Lp were all normal.   Initial visit 07/30/2015: Carrie Reed is a 31 y.o. female here as a referral from Gus Height for headaches. Hx of migraines once a year, very infrequent. Migraines since 2006. She started having worsening headaches a month ago. She woke up one morning with a dull pain and that has steadily gotten worse. Every day, continuous. Sometimes bilateral, sometime on the left. Worse in the morning when she wakes up. This is not her typical migraines. With her migraines she sleeps it off, now it is continuous and a different tyoe of pain. She is currently having nausea and vomiting. Light sensitivity is not as severe as with her migraines. More a feeling of pressure and it hurts on the left(points to the parietal area). Like something is bulging out of the temple. Some sharp persistent pain, not paroxysmal, no lacrimation, injection or rhinorrhea. Left blurry vision, dizzy spells and passing out/losing consciousness. She went to the ER in July because she woke up on the floor and felt her hands and feet were tingly. 3 days later she was in the kitchen having a conversation and she "fell out" and she actually lost consciousness, she was "out" for a minute, no convulsions, her eyes were closed but fluttering inside, her husband caught her before  she hit the ground. She has hearing loss in the right ear. She is having pulsatile tinnitus on the left ear. Headaches worse when laying down. She has a positional headache, worse when laying down. Sounds like a baby's heartbeat in her left ear. She has gained 50 pounds in less than a year. Severe 10/10  daily. No Fhx migraines.  Reviewed notes, labs and imaging from outside physicians, which showed: IMPRESSION: 1. No acute intracranial process identified. No mass lesion or abnormal enhancement. Previously described decreased attenuation within the right temporal lobe likely reflected volume averaging with the adjacent sylvian fissure. 2. Multiple nonspecific, nonenhancing cerebral white matter foci within the periventricular and deep white matter of both cerebral hemispheres, mild to moderate for patient age. Differential considerations include accelerated/hereditary small vessel ischemia, sequela of prior trauma, hypercoagulable state, vasculitis, migraines, prior infection, and less likely demyelination. BMP normal  Review of Systems: Patient complains of symptoms per HPI as well as the following symptoms: No CP, no SOB. Pertinent negatives per HPI. All others negative.  Social History   Social History  . Marital status: Married    Spouse name: Jonny Ruiz  . Number of children: 4  . Years of education: Associates   Occupational History  . [employer]    Social History Main Topics  . Smoking status: Current Every Day Smoker    Packs/day: 0.25    Types: Cigarettes  . Smokeless tobacco: Never Used  . Alcohol use 0.0 oz/week     Comment: social  . Drug use: No  . Sexual activity: Not on file   Other Topics Concern  . Not on file   Social History Narrative   Lives at home with husband and 4 children.   Caffeine use:  Drinks coffee (1/2 pot-1 pot every day)        Family History  Problem Relation Age of Onset  . Cirrhosis Mother   . Heart failure Father   . Cancer Sister   . High blood pressure Brother   . Cervical cancer Sister   . Migraines Neg Hx     Past Medical History:  Diagnosis Date  . Migraine     Past Surgical History:  Procedure Laterality Date  . APPENDECTOMY    . bulging disc     cervical  . TONSILLECTOMY    . TONSILLECTOMY AND ADENOIDECTOMY       x2  . TYMPANOSTOMY TUBE PLACEMENT      Current Outpatient Prescriptions  Medication Sig Dispense Refill  . gabapentin (NEURONTIN) 300 MG capsule Take 1 capsule (300 mg total) by mouth 3 (three) times daily. 90 capsule 11  . metoCLOPramide (REGLAN) 10 MG tablet Take 1 tablet (10 mg total) by mouth every 8 (eight) hours as needed for nausea or vomiting. 20 tablet 0  . tiZANidine (ZANAFLEX) 2 MG tablet Take 2 mg by mouth 3 (three) times daily as needed (for migraines).    Marland Kitchen tiZANidine (ZANAFLEX) 2 MG tablet TAKE 1 TABLET (2 MG TOTAL) BY MOUTH 3 (THREE) TIMES DAILY. 90 tablet 0  . venlafaxine XR (EFFEXOR-XR) 150 MG 24 hr capsule TAKE 1 CAPSULE BY MOUTH DAILY WITH BREAKFAST. 30 capsule 5   No current facility-administered medications for this visit.     Allergies as of 02/05/2017 - Review Complete 07/21/2016  Allergen Reaction Noted  . Propranolol Palpitations 05/13/2016    Vitals: There were no vitals taken for this visit. Last Weight:  Wt Readings from Last 1 Encounters:  07/21/16 180  lb (81.6 kg)   Last Height:   Ht Readings from Last 1 Encounters:  07/21/16 5\' 7"  (1.702 m)   Physical exam: Exam: Gen: NAD, conversant, well nourised, obese, well groomed                     CV: RRR, no MRG. No Carotid Bruits. No peripheral edema, warm, nontender Eyes: Conjunctivae clear without exudates or hemorrhage  Neuro: Detailed Neurologic Exam  Speech:    Speech is normal; fluent and spontaneous with normal comprehension.  Cognition:    The patient is oriented to person, place, and time;     recent and remote memory intact;     language fluent;     normal attention, concentration,     fund of knowledge Cranial Nerves:    The pupils are equal, round, and reactive to light. The fundi are normal and spontaneous venous pulsations are present. Visual fields are full to finger confrontation. Extraocular movements are intact. Trigeminal sensation is intact and the muscles of mastication  are normal. The face is symmetric. The palate elevates in the midline. Hearing intact. Voice is normal. Shoulder shrug is normal. The tongue has normal motion without fasciculations.   Coordination:    Normal finger to nose and heel to shin. Normal rapid alternating movements.   Gait:    Heel-toe and tandem gait are normal.   Motor Observation:    No asymmetry, no atrophy, and no involuntary movements noted. Tone:    Normal muscle tone.    Posture:    Posture is normal. normal erect    Strength:    Strength is V/V in the upper and lower limbs.      Sensation: intact to LT     Reflex Exam:  DTR's:    Deep tendon reflexes in the upper and lower extremities are normal bilaterally.   Toes:    The toes are downgoing bilaterally.   Clonus:    Clonus is absent.    Assessment/Plan: 31 year old with Intractable migraine and multiple drug failures or side effects.  Failed: Topiramate, Nortriptyline(fatigue), Venlafaxine, steroids (reaction), gabapentin, zofran, maxalt, tizanidine, reglan, propranolol, Topiramate ER(nausea,feel sick), Amitriptyline (sedation) She has reactions to steroids, cant give a taper.  Try Gralise  Relpax and zofran at onset of migraine  To prevent or relieve headaches, try the following:  Cool Compress. Lie down and place a cool compress on your head.   Avoid headache triggers. If certain foods or odors seem to have triggered your migraines in the past, avoid them. A headache diary might help you identify triggers.   Include physical activity in your daily routine. Try a daily walk or other moderate aerobic exercise.   Manage stress. Find healthy ways to cope with the stressors, such as delegating tasks on your to-do list.   Practice relaxation techniques. Try deep breathing, yoga, massage and visualization.   Eat regularly. Eating regularly scheduled meals and maintaining a healthy diet might help prevent headaches. Also, drink plenty of fluids.    Follow a regular sleep schedule. Sleep deprivation might contribute to headaches  Consider biofeedback. With this mind-body technique, you learn to control certain bodily functions - such as muscle tension, heart rate and blood pressure - to prevent headaches or reduce headache pain.    Proceed to emergency room if you experience new or worsening symptoms or symptoms do not resolve, if you have new neurologic symptoms or if headache is severe, or for any concerning symptom.  Return as needed  Naomie DeanAntonia Shaylea Ucci, MD  [facility] [address] [place], [place] [id number]  Phone [phone] Fax [phone]  A total of 25 minutes was spent face-to-face with this patient. Over half this time was spent on counseling patient on the migraine diagnosis and different diagnostic and therapeutic options available.

## 2017-02-05 NOTE — Patient Instructions (Signed)
Remember to drink plenty of fluid, eat healthy meals and do not skip any meals. Try to eat protein with a every meal and eat a healthy snack such as fruit or nuts in between meals. Try to keep a regular sleep-wake schedule and try to exercise daily, particularly in the form of walking, 20-30 minutes a day, if you can.   As far as your medications are concerned, I would like to : Try Gralise in the evening 300 or 600mg  at night At onset of migraine, take Relpax may repeat once in 2 hours if needed.  I would like to see you back as needed, sooner if we need to. Please call us with any interim questions, concerns, problems, updates or refill requests.   Please also call us for any test results so we can go over those with you on the phone.  My clinical assistant and will answer any of your questions and relay your messages to me and also relay most of my messages to you.   Our phone number is [phone]. We also have an after hours call service for urgent matters and there is a physician on-call for urgent questions. For any emergencies you know to call 911 or go to the nearest emergency room  Gabapentin extended-release tablets (Gralise) What is this medicine? GABAPENTIN (GA ba pen tin) is used to treat certain types of nerve pain. This medicine may be used for other purposes; ask your health care provider or pharmacist if you have questions. COMMON BRAND NAME(S): Gralise What should I tell my health care provider before I take this medicine? They need to know if you have any of these conditions: -kidney disease -seizures -suicidal thoughts, plans, or attempt; a previous suicide attempt by you or a family member -an unusual or allergic reaction to gabapentin, other medicines, foods, dyes, or preservatives -pregnant or trying to get pregnant -breast-feeding How should I use this medicine? Take this medicine by mouth with a glass of water. Follow the directions on the prescription label.  Take this medicine with your evening meal. Do not cut, crush, or chew this medicine. Take your medicine at regular intervals. Do not take it more often than directed. Do not stop taking except on your doctor's advice. A special MedGuide will be given to you by the pharmacist with each prescription and refill. Be sure to read this information carefully each time. Talk to your pediatrician regarding the use of this medicine in children. Special care may be needed. Overdosage: If you think you have taken too much of this medicine contact a poison control center or emergency room at once. NOTE: This medicine is only for you. Do not share this medicine with others. What if I miss a dose? If you miss a dose, take it as soon as you can. If it is almost time for your next dose, take only that dose. Do not take double or extra doses. What may interact with this medicine? Do not take this medicine with any of the following medications: -other gabapentin products (Horizant, Neurontin) This medicine may also interact with the following medications: -alcohol -antacids -antihistamines for allergy, cough and cold -certain medicines for anxiety or sleep -certain medicines for depression or psychotic disturbances -homatropine; hydrocodone -naproxen -narcotic medicines (opiates) for pain -phenothiazines like chlorpromazine, mesoridazine, prochlorperazine, thioridazine This list may not describe all possible interactions. Give your health care provider a list of all the medicines, herbs, non-prescription drugs, or dietary supplements you use. Also tell them if you  smoke, drink alcohol, or use illegal drugs. Some items may interact with your medicine. What should I watch for while using this medicine? Tell your doctor or healthcare professional if your symptoms do not start to get better or if they get worse. Do not stop taking except on your doctor's advice. You may develop a severe reaction. Your doctor will  tell you how much medicine to take. You may get drowsy or dizzy. Do not drive, use machinery, or do anything that needs mental alertness until you know how this medicine affects you. Do not stand or sit up quickly, especially if you are an older patient. This reduces the risk of dizzy or fainting spells. Alcohol may interfere with the effect of this medicine. Avoid alcoholic drinks. The use of this medicine may increase the chance of suicidal thoughts or actions. Pay special attention to how you are responding while on this medicine. Any worsening of mood, or thoughts of suicide or dying should be reported to your health care professional right away. Your mouth may get dry. Chewing sugarless gum or sucking hard candy, and drinking plenty of water may help. Contact your doctor if the problem does not go away or is severe. What side effects may I notice from receiving this medicine? Side effects that you should report to your doctor or health care professional as soon as possible: -allergic reactions like skin rash, itching or hives, swelling of the face, lips, or tongue -suicidal thoughts or other mood changes Side effects that usually do not require medical attention (report to your doctor or health care professional if they continue or are bothersome): -confusion -diarrhea -dizziness -dry mouth -loss of balance or coordination -nausea -tiredness -tremors -weight gain This list may not describe all possible side effects. Call your doctor for medical advice about side effects. You may report side effects to FDA at 1-800-FDA-1088. Where should I keep my medicine? Keep out of the reach of children. This medicine may cause accidental overdose and death if it taken by other adults, children, or pets. Mix any unused medicine with a substance like cat litter or coffee grounds. Then throw the medicine away in a sealed container like a sealed bag or a coffee can with a lid. Do not use the medicine after  the expiration date. Store at room temperature between 15 and 30 degrees C (59 and 86 degrees F). NOTE: This sheet is a summary. It may not cover all possible information. If you have questions about this medicine, talk to your doctor, pharmacist, or health care provider.  2018 Elsevier/Gold Standard (2016-05-09 10:14:51)

## 2017-05-25 IMAGING — CT CT RENAL STONE PROTOCOL
1 series · 15 of 32 positions shown, 19 images · non-contrast
Comparison: October 17, 2014

CLINICAL DATA: Right upper quadrant abdomen pain and right flank
pain.

EXAM:
CT ABDOMEN AND PELVIS WITHOUT CONTRAST
TECHNIQUE: Multidetector CT imaging of the abdomen and pelvis was performed
following the standard protocol without IV contrast.

[Series 4: lung windows · axial · 0.81mm/px · z∈[+1200,+1356]mm · 15 of 58 slices shown, 19 images]
[im 4/58  soft-tissue]
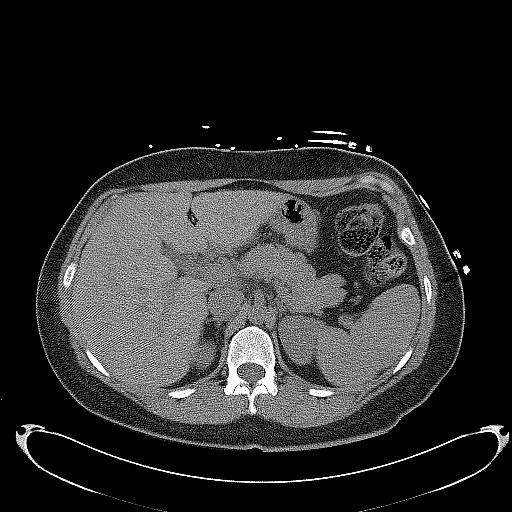
[im 4/58  bone]
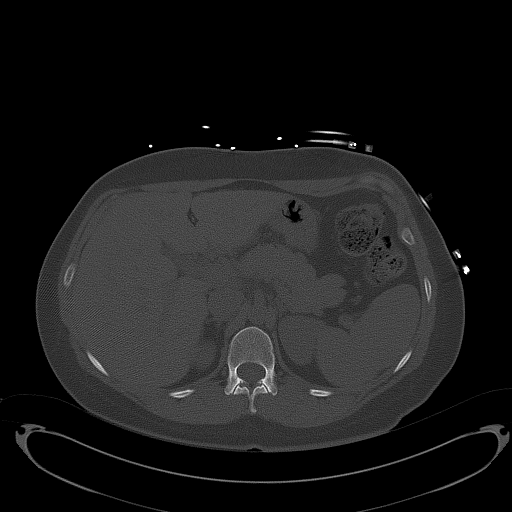
[im 8/58  soft-tissue]
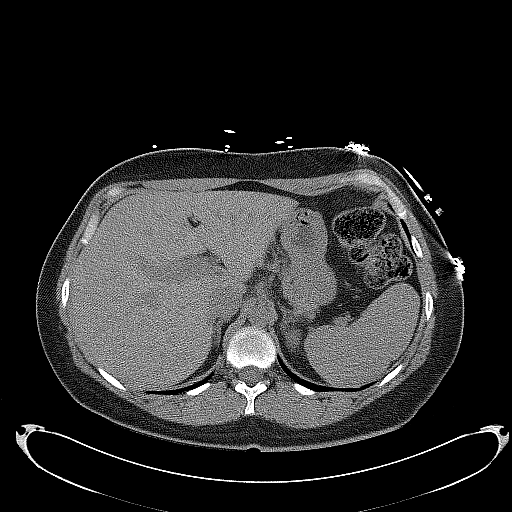
[im 12/58  soft-tissue]
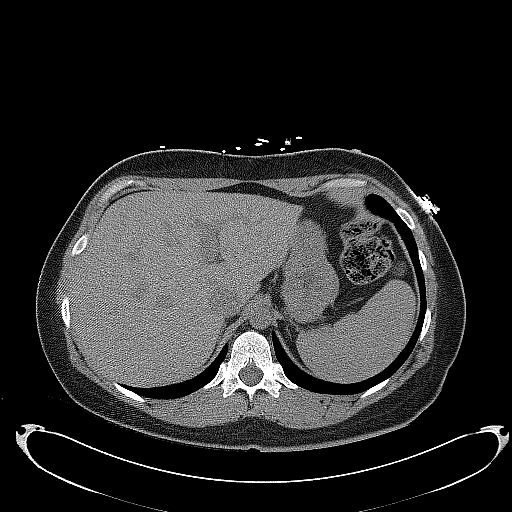
[im 17/58  soft-tissue]
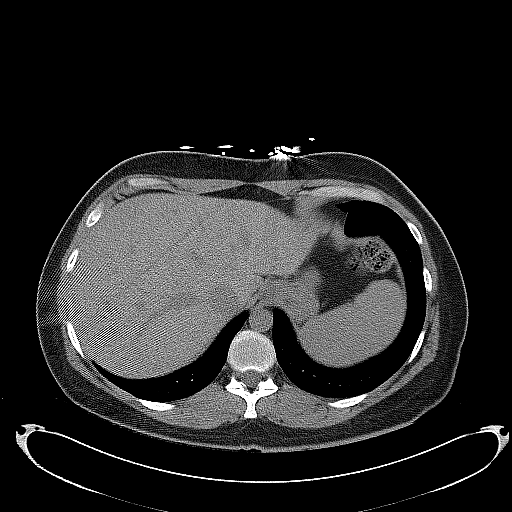
[im 21/58  soft-tissue]
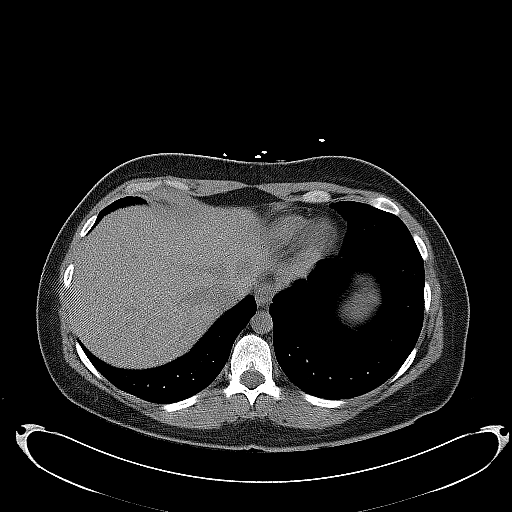
[im 24/58  soft-tissue]
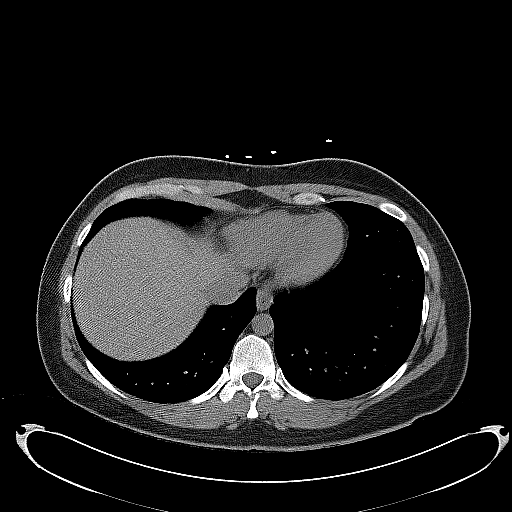
[im 30/58  soft-tissue]
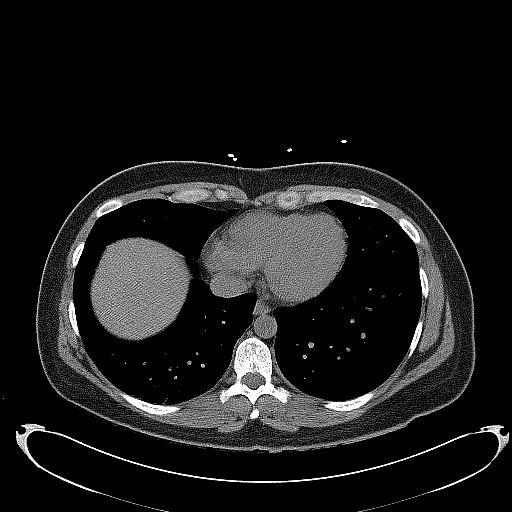
[im 34/58  soft-tissue]
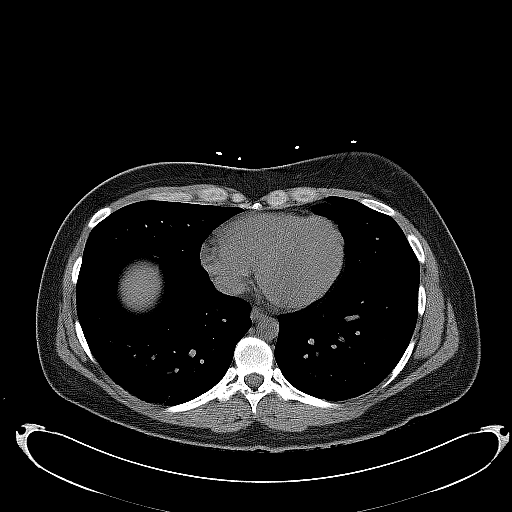
[im 37/58  soft-tissue]
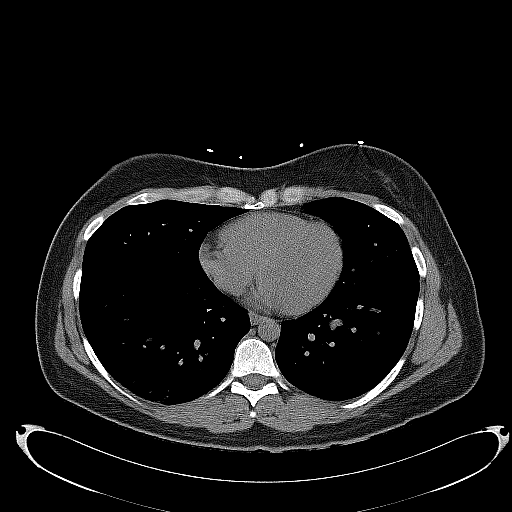
[im 37/58  bone]
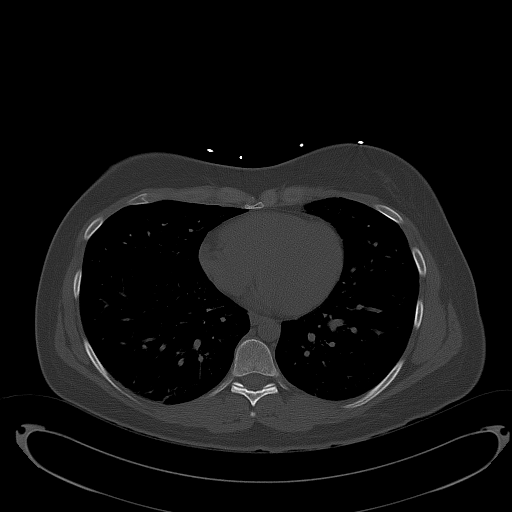
[im 41/58  soft-tissue]
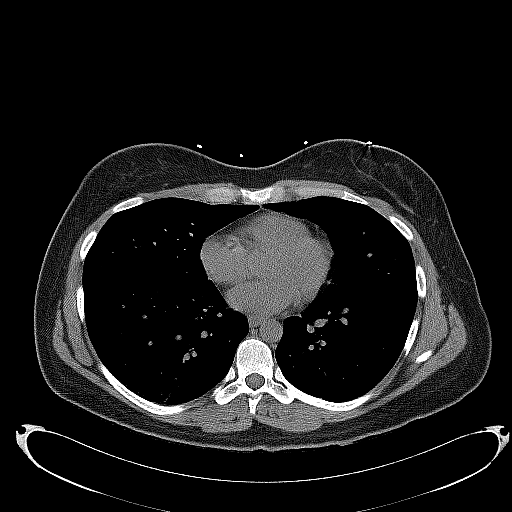
[im 46/58  soft-tissue]
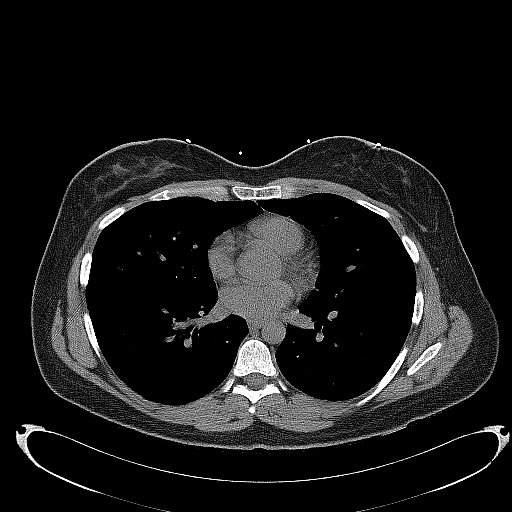
[im 50/58  soft-tissue]
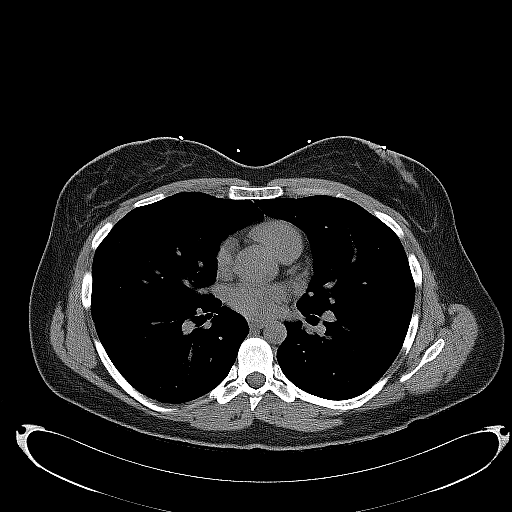
[im 50/58  lung]
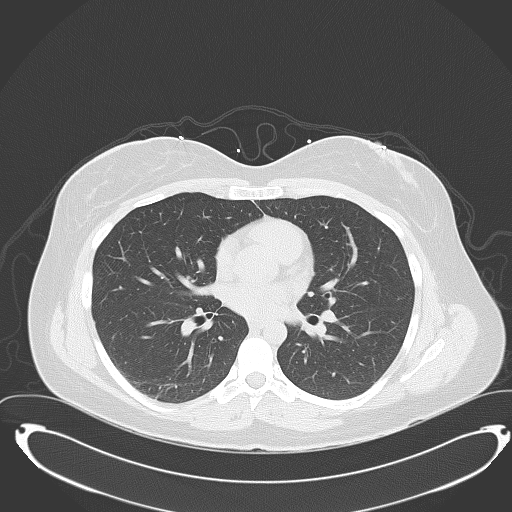
[im 52/58  lung]
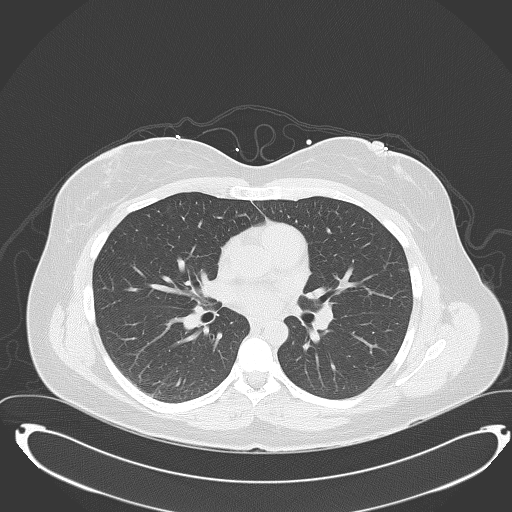
[im 54/58  soft-tissue]
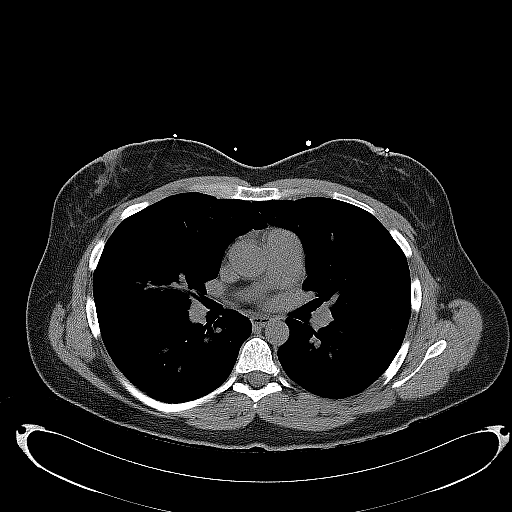
[im 54/58  lung]
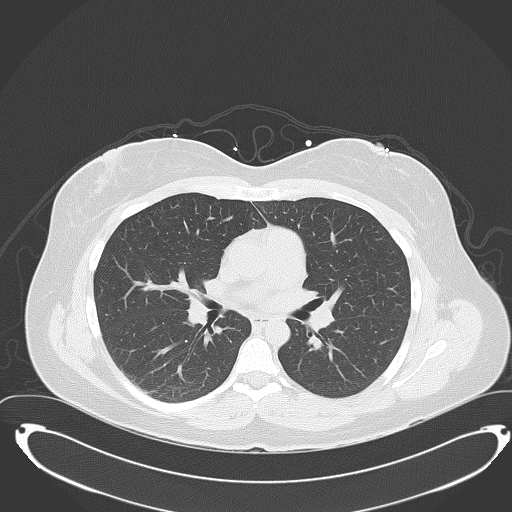
[im 56/58  lung]
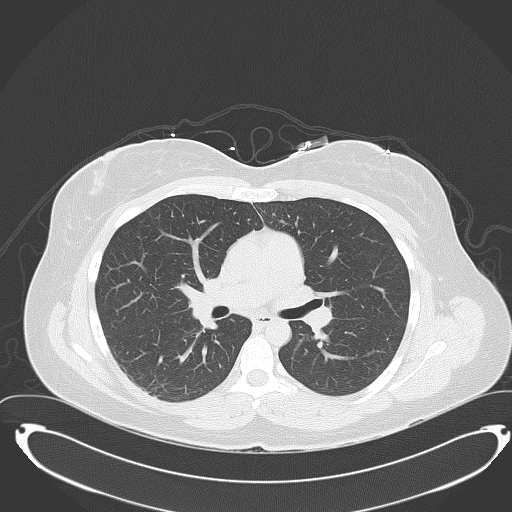

[15 of 32 positions shown; findings below may reference images not displayed]

FINDINGS: Lower chest: There is minimal atelectasis of right lung base. There
is no pleural effusion or consolidation of the lung bases.

Hepatobiliary: The liver and gallbladder are normal. The biliary
ducts are normal. No mass visualized on this un-enhanced exam.

Pancreas: No mass or inflammatory process identified on this
un-enhanced exam.

Spleen: Within normal limits in size.

Adrenals/Urinary Tract: The adrenal glands are normal. Bilateral
kidneys are normal. Right parapelvic renal cysts is identified. No
evidence of nephrolithiasis or hydronephrosis. No definite mass
visualized on this un-enhanced exam. The bladder is normal.

Stomach/Bowel: Patient status post prior appendectomy. Moderate
bowel content is identified throughout colon. No evidence of
obstruction, inflammatory process, or abnormal fluid collections.

Vascular/Lymphatic: No pathologically enlarged lymph nodes. No
evidence of abdominal aortic aneurysm.

Reproductive: No mass or other significant abnormality.

Other: None.

Musculoskeletal:  No suspicious bone lesions identified.
IMPRESSION: Normal gallbladder.

No hydronephrosis bilaterally.

Patient status post prior appendectomy.

Moderate bowel content is identified throughout colon. Question
constipation.

## 2017-05-25 IMAGING — CR DG CHEST 2V
2 series · 2 of 2 positions shown · non-contrast
Comparison: 12/12/2014

CLINICAL DATA: Syncopal episode today. Left chest pain and
shortness of breath.

EXAM:
CHEST  2 VIEW

[w chest pa]
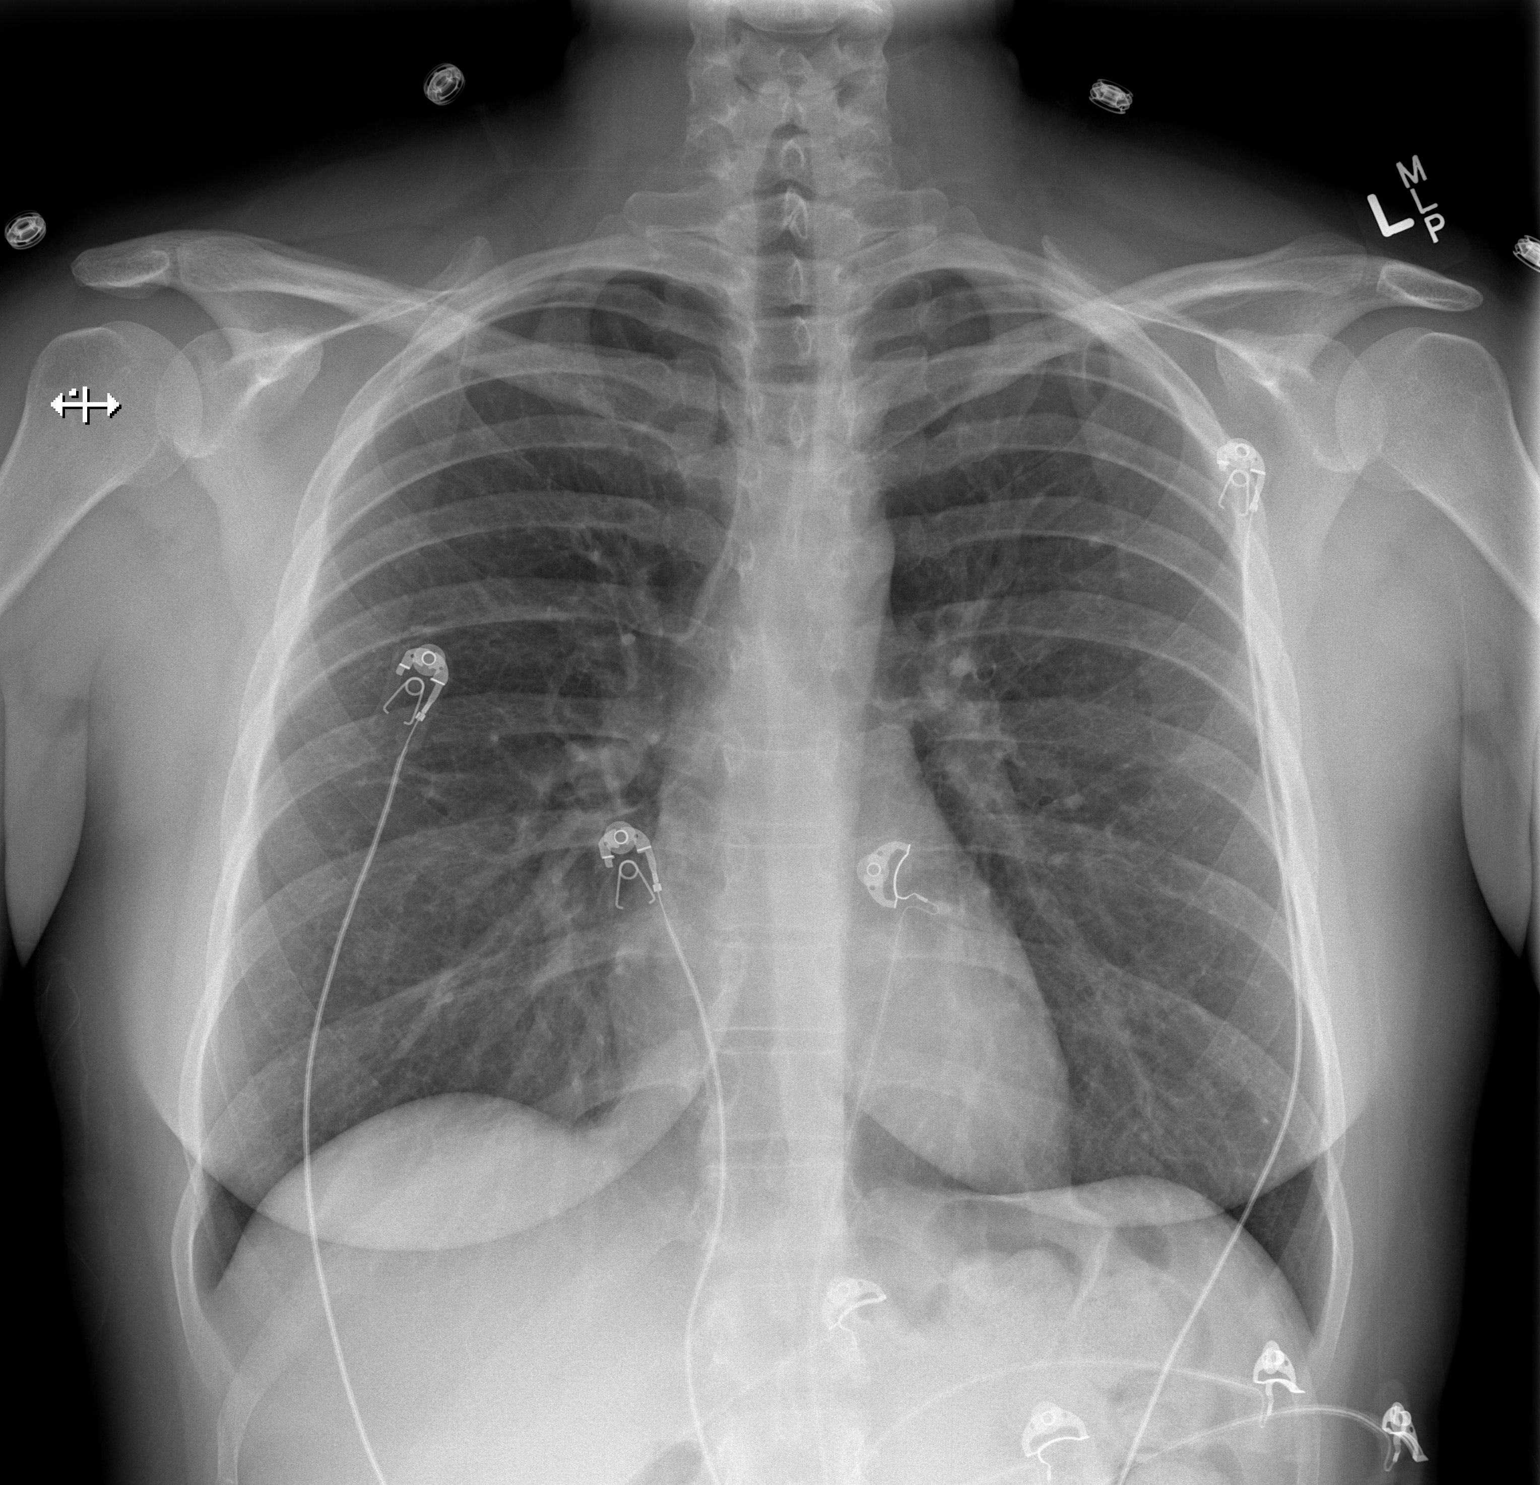

[w chest lat]
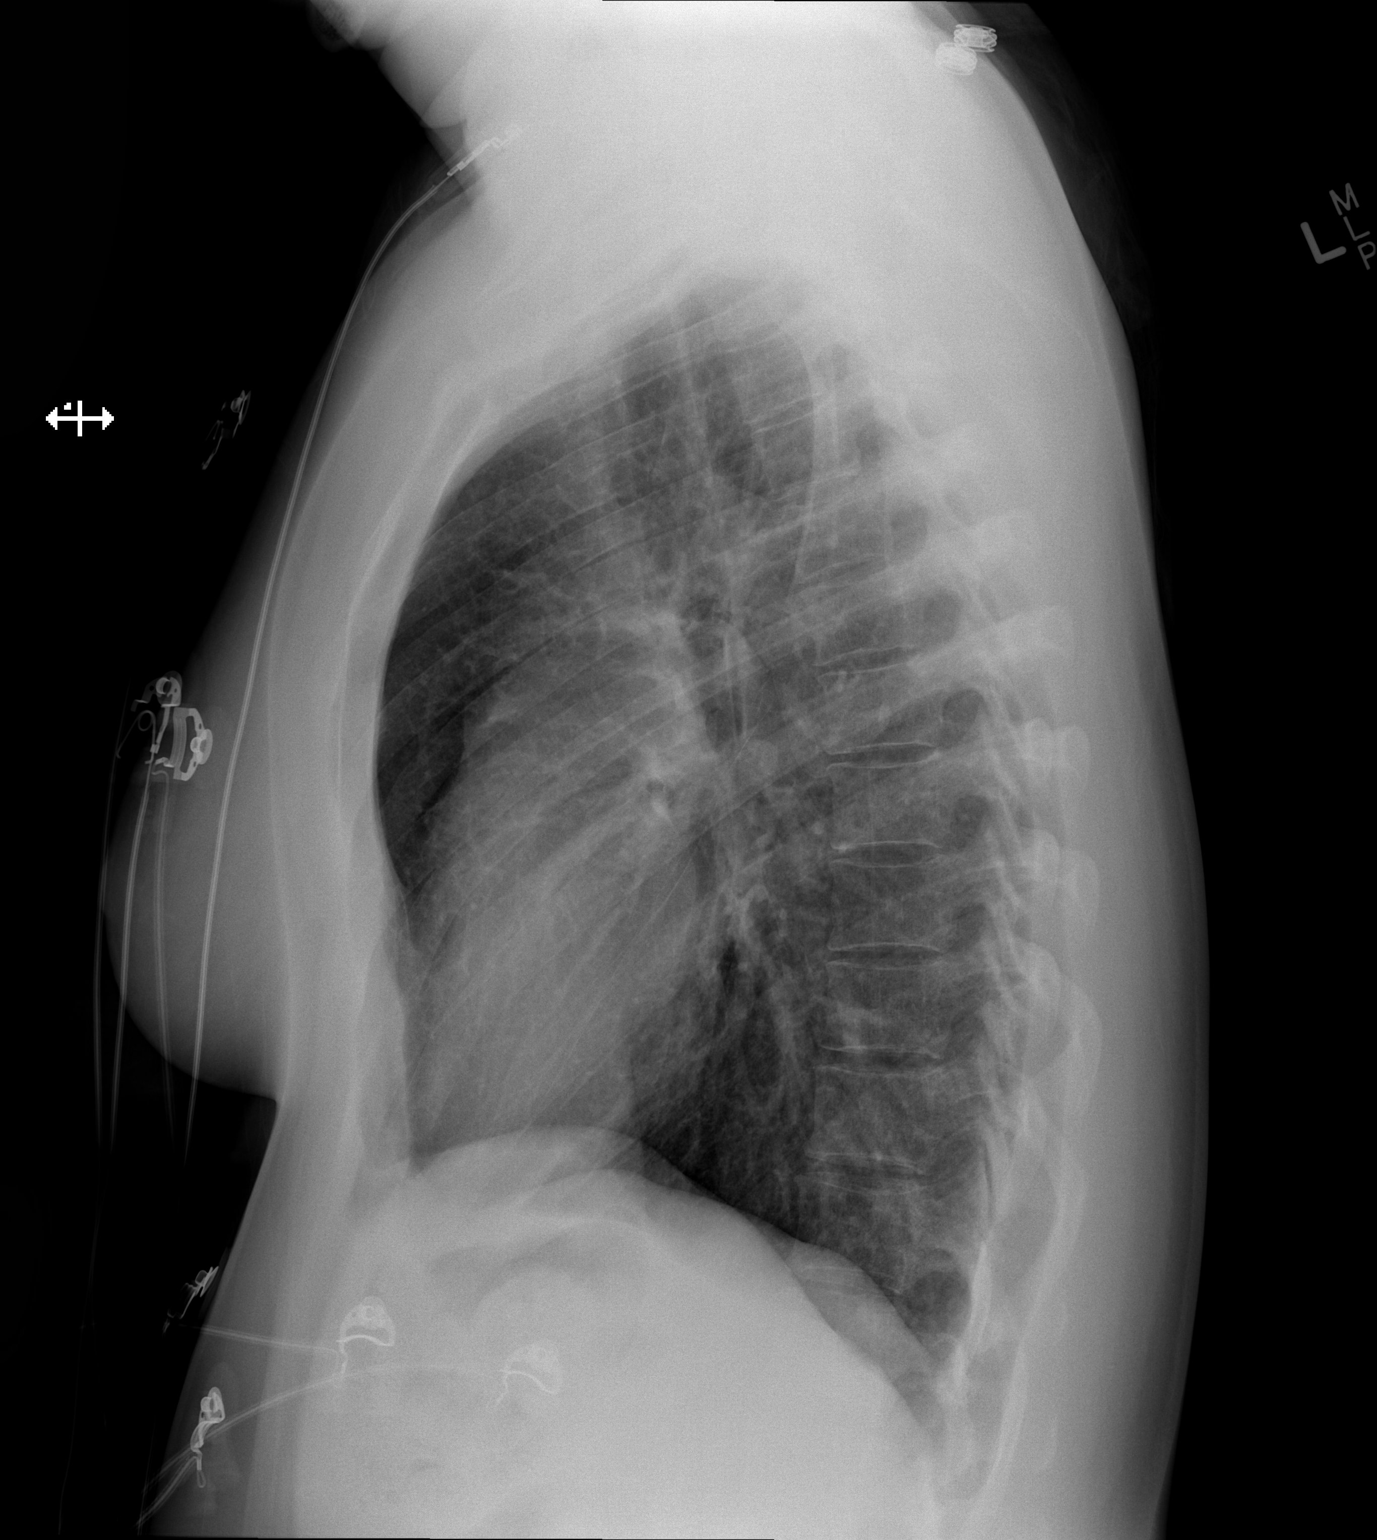

[2 of 2 positions shown; findings below may reference images not displayed]

FINDINGS: The heart size and mediastinal contours are within normal limits.
Both lungs are clear. No evidence of pneumothorax or pleural
effusion. The visualized skeletal structures are unremarkable.
IMPRESSION: No active cardiopulmonary disease.

## 2017-08-06 ENCOUNTER — Encounter (HOSPITAL_COMMUNITY): Payer: Self-pay | Admitting: Emergency Medicine

## 2017-08-06 ENCOUNTER — Emergency Department (HOSPITAL_COMMUNITY)
Admission: EM | Admit: 2017-08-06 | Discharge: 2017-08-06 | Disposition: A | Discharge status: Home / Self Care | Payer: Managed Care, Other (non HMO) | Attending: Emergency Medicine | Admitting: Emergency Medicine

## 2017-08-06 ENCOUNTER — Emergency Department (HOSPITAL_COMMUNITY): Payer: Managed Care, Other (non HMO)

## 2017-08-06 DIAGNOSIS — F17210 Nicotine dependence, cigarettes, uncomplicated: Secondary | ICD-10-CM | POA: Diagnosis not present

## 2017-08-06 DIAGNOSIS — Z79899 Other long term (current) drug therapy: Secondary | ICD-10-CM | POA: Insufficient documentation

## 2017-08-06 DIAGNOSIS — K2970 Gastritis, unspecified, without bleeding: Secondary | ICD-10-CM | POA: Insufficient documentation

## 2017-08-06 DIAGNOSIS — R112 Nausea with vomiting, unspecified: Secondary | ICD-10-CM

## 2017-08-06 DIAGNOSIS — R3121 Asymptomatic microscopic hematuria: Secondary | ICD-10-CM

## 2017-08-06 DIAGNOSIS — R1011 Right upper quadrant pain: Secondary | ICD-10-CM

## 2017-08-06 LAB — URINALYSIS, ROUTINE W REFLEX MICROSCOPIC
BILIRUBIN URINE: NEGATIVE
Glucose, UA: NEGATIVE mg/dL
KETONES UR: NEGATIVE mg/dL
LEUKOCYTES UA: NEGATIVE
Nitrite: NEGATIVE
PROTEIN: NEGATIVE mg/dL
Specific Gravity, Urine: 1.019 (ref 1.005–1.030)
pH: 5 (ref 5.0–8.0)

## 2017-08-06 LAB — COMPREHENSIVE METABOLIC PANEL
ALK PHOS: 57 U/L (ref 38–126)
ALT: 20 U/L (ref 14–54)
AST: 24 U/L (ref 15–41)
Albumin: 4.2 g/dL (ref 3.5–5.0)
Anion gap: 7 (ref 5–15)
BILIRUBIN TOTAL: 0.3 mg/dL (ref 0.3–1.2)
BUN: 14 mg/dL (ref 6–20)
CALCIUM: 9.3 mg/dL (ref 8.9–10.3)
CHLORIDE: 105 mmol/L (ref 101–111)
CO2: 25 mmol/L (ref 22–32)
CREATININE: 0.76 mg/dL (ref 0.44–1.00)
Glucose, Bld: 93 mg/dL (ref 65–99)
Potassium: 4 mmol/L (ref 3.5–5.1)
Sodium: 137 mmol/L (ref 135–145)
Total Protein: 7.2 g/dL (ref 6.5–8.1)

## 2017-08-06 LAB — CBC
HCT: 39.2 % (ref 36.0–46.0)
Hemoglobin: 13.6 g/dL (ref 12.0–15.0)
MCH: 30.9 pg (ref 26.0–34.0)
MCHC: 34.7 g/dL (ref 30.0–36.0)
MCV: 89.1 fL (ref 78.0–100.0)
PLATELETS: 286 10*3/uL (ref 150–400)
RBC: 4.4 MIL/uL (ref 3.87–5.11)
RDW: 13.1 % (ref 11.5–15.5)
WBC: 8 10*3/uL (ref 4.0–10.5)

## 2017-08-06 LAB — I-STAT BETA HCG BLOOD, ED (MC, WL, AP ONLY)

## 2017-08-06 LAB — LIPASE, BLOOD: LIPASE: 26 U/L (ref 11–51)

## 2017-08-06 LAB — I-STAT TROPONIN, ED: Troponin i, poc: 0 ng/mL (ref 0.00–0.08)

## 2017-08-06 MED ORDER — ONDANSETRON 4 MG PO TBDP: 4.0000 mg | ORAL_TABLET | Freq: Three times a day (TID) | ORAL | 0 refills | Status: DC | PRN

## 2017-08-06 MED ORDER — RANITIDINE HCL 150 MG PO TABS: 150.0000 mg | ORAL_TABLET | Freq: Two times a day (BID) | ORAL | 0 refills | Status: DC

## 2017-08-06 MED ORDER — ONDANSETRON 8 MG PO TBDP
8.0000 mg | ORAL_TABLET | Freq: Once | ORAL | Status: AC
  Administered 2017-08-06: 8 mg via ORAL
  Filled 2017-08-06: qty 1

## 2017-08-06 MED ORDER — MORPHINE SULFATE (PF) 4 MG/ML IV SOLN
4.0000 mg | Freq: Once | INTRAVENOUS | Status: AC
  Administered 2017-08-06: 4 mg via INTRAVENOUS
  Filled 2017-08-06: qty 1

## 2017-08-06 MED ORDER — GI COCKTAIL ~~LOC~~
30.0000 mL | Freq: Once | ORAL | Status: AC
  Administered 2017-08-06: 30 mL via ORAL
  Filled 2017-08-06: qty 30

## 2017-08-06 NOTE — Discharge Instructions (Signed)
Your symptoms could be related to gallbladder dysfunction, for which you would need a HIDA scan as an outpatient; see your doctor in order to discuss this and to have this ordered if they feel it's indicated. You did have some blood in your urine sample, which will also need further outpatient monitoring done by your regular doctor. It's also possible that your symptoms today are related to gastritis or an ulcer. You will need to take zantac as directed, and avoid spicy/fatty/acidic foods, avoid soda/coffee/tea/alcohol. Avoid laying down flat within 30 minutes of eating. Avoid NSAIDs like ibuprofen/aleve/motrin/etc on an empty stomach. May consider using over the counter tums/maalox as needed for additional relief. Use zofran as directed as needed for nausea. Use tylenol as needed for pain. Follow up with your regular doctor in 5-7 days for recheck of symptoms and ongoing management of your symptoms. Return to the ER for changes or worsening symptoms.  Abdominal (belly) pain can be caused by many things. Your caregiver performed an examination and possibly ordered blood/urine tests and imaging (CT scan, x-rays, ultrasound). Many cases can be observed and treated at home after initial evaluation in the emergency department. Even though you are being discharged home, abdominal pain can be unpredictable. Therefore, you need a repeated exam if your pain does not resolve, returns, or worsens. Most patients with abdominal pain don't have to be admitted to the hospital or have surgery, but serious problems like appendicitis and gallbladder attacks can start out as nonspecific pain. Many abdominal conditions cannot be diagnosed in one visit, so follow-up evaluations are very important. SEEK IMMEDIATE MEDICAL ATTENTION IF YOU DEVELOP ANY OF THE FOLLOWING SYMPTOMS: The pain does not go away or becomes severe.  A temperature above 101 develops.  Repeated vomiting occurs (multiple episodes).  The pain becomes localized  to portions of the abdomen. The right side could possibly be appendicitis. In an adult, the left lower portion of the abdomen could be colitis or diverticulitis.  Blood is being passed in stools or vomit (bright red or black tarry stools).  Return also if you develop chest pain, difficulty breathing, dizziness or fainting, or become confused, poorly responsive, or inconsolable (young children). The constipation stays for more than 4 days.  There is belly (abdominal) or rectal pain.  You do not seem to be getting better.

## 2017-08-06 NOTE — ED Notes (Signed)
Provided patient a cup of ice water for PO challenge.

## 2017-08-06 NOTE — ED Triage Notes (Signed)
Patient c/o RUQ pain that radiates to back that has been ongoing but will have intensity flare up since Last Friday. Patient reports ate left over mac n cheese these morning then hour later pain became worse.  Had nausea but not vomiting today. Denies diarrhea.

## 2017-08-06 NOTE — ED Provider Notes (Signed)
[facility]-EMERGENCY DEPT Provider Note   CSN: [id number] Arrival date & time: 08/06/17  1307     History   Chief Complaint Chief Complaint  Patient presents with  . RUQ pain  . Nausea    HPI Carrie Reed is a 31 y.o. female with a PMHx of migraines and PSHx of appendectomy, who presents to the ED with complaints of recurrent RUQ abdominal pain that has gradually worsened over the last 6 days. She states that she has never had a formal diagnosis however has been told that potentially she has gallbladder problems. Had an ultrasound last year which did not show any gallstones however she has not followed up for further evaluation. She mentions that since starting effexor for headaches, she has had "issues with her stomach". She was previously seen at [facility] and had an EGD which was reportedly normal. She states that over the last 6 days she has had worsening pain, describes it as 10/10 constant dull aching and intermittent squeezing/sharp RUQ pain that radiates to the right flank/upper back area, worse sometimes with eating and drinking sodas, unrelieved with Tums and Pepcid, and mildly improved with drinking water. She hasn't noticed an association with specific foods (i.e. states she can eat pizza without issue, but ate a sub sandwich yesterday and that worsened her symptoms). She reports associated nausea and chills, and vomiting occasionally, last emesis was yesterday around 9:30 PM when she had one episode of nonbloody nonbilious emesis. She has not had any further episodes of vomiting today. She admits to occasional alcohol use, 1-2 times per month, last use was last weekend. She states that she takes NSAIDs maybe twice a year when she needs them for headaches. Her last meal was at 9 AM. Her PCP is Olivia Canter in [place]. LMP was at the end of last month.  She denies fevers, CP, SOB, diarrhea/constipation, obstipation, melena, hematochezia, hematemesis, hematuria, dysuria, vaginal  bleeding/discharge, myalgias, arthralgias, numbness, tingling, focal weakness, or any other complaints at this time. Denies recent travel, sick contacts, or suspicious food intake.    The history is provided by the patient and medical records. No language interpreter was used.  Abdominal Pain   This is a recurrent problem. The current episode started more than 2 days ago. The problem occurs constantly. The problem has been gradually worsening. The pain is associated with an unknown factor. The pain is located in the RUQ. The quality of the pain is dull, aching and sharp (constant dull/ache, intermittent squeezing/sharp). The pain is at a severity of 10/10. The pain is moderate. Associated symptoms include nausea and vomiting. Pertinent negatives include fever, diarrhea, flatus, hematochezia, melena, constipation, dysuria, hematuria, arthralgias and myalgias. The symptoms are aggravated by eating. The symptoms are relieved by liquids.    Past Medical History:  Diagnosis Date  . Bulging of cervical intervertebral disc   . Migraine     Patient Active Problem List   Diagnosis Date Noted  . Intractable chronic migraine without aura and without status migrainosus 04/27/2016  . Intractable vascular headache 08/02/2015  . Pulsatile tinnitus of left ear 08/02/2015  . Blurry vision 08/02/2015  . Worsening headaches 08/02/2015    Past Surgical History:  Procedure Laterality Date  . APPENDECTOMY    . TONSILLECTOMY AND ADENOIDECTOMY     x2  . TYMPANOSTOMY TUBE PLACEMENT      OB History    No data available       Home Medications    Prior to Admission medications  Medication Sig Start Date End Date Taking? Authorizing Provider  eletriptan (RELPAX) 40 MG tablet Take 1 tablet (40 mg total) by mouth as needed for migraine or headache. May repeat in 2 hours. Max twice in a day. 02/05/17   Anson FretAhern, Antonia B, MD  gabapentin (NEURONTIN) 300 MG capsule Take 1 capsule (300 mg total) by mouth 3  (three) times daily. 05/07/16   Anson FretAhern, Antonia B, MD  Gabapentin, Once-Daily, (GRALISE) 600 MG TABS Take 600 mg by mouth at bedtime. 02/05/17   Anson FretAhern, Antonia B, MD  tiZANidine (ZANAFLEX) 2 MG tablet TAKE 1 TABLET (2 MG TOTAL) BY MOUTH 3 (THREE) TIMES DAILY. 10/09/16   Anson FretAhern, Antonia B, MD  venlafaxine XR (EFFEXOR-XR) 150 MG 24 hr capsule TAKE 1 CAPSULE BY MOUTH DAILY WITH BREAKFAST. 12/25/16   Anson FretAhern, Antonia B, MD    Family History Family History  Problem Relation Age of Onset  . Cirrhosis Mother   . Kidney disease Mother   . Heart failure Father   . Cancer Sister   . High blood pressure Brother   . Cervical cancer Sister   . Migraines Neg Hx     Social History Social History  Substance Use Topics  . Smoking status: Current Every Day Smoker    Packs/day: 0.10    Types: Cigarettes  . Smokeless tobacco: Never Used  . Alcohol use 0.0 oz/week     Comment: social     Allergies   Propranolol   Review of Systems Review of Systems  Constitutional: Positive for chills. Negative for fever.  Respiratory: Negative for shortness of breath.   Cardiovascular: Negative for chest pain.  Gastrointestinal: Positive for abdominal pain, nausea and vomiting. Negative for blood in stool, constipation, diarrhea, flatus, hematochezia and melena.  Genitourinary: Negative for dysuria, hematuria, vaginal bleeding and vaginal discharge.  Musculoskeletal: Negative for arthralgias and myalgias.  Skin: Negative for color change.  Allergic/Immunologic: Negative for immunocompromised state.  Neurological: Negative for weakness and numbness.  Psychiatric/Behavioral: Negative for confusion.   All other systems reviewed and are negative for acute change except as noted in the HPI.    Physical Exam Updated Vital Signs BP 136/86 (BP Location: Left Arm)   Pulse 83   Temp 98.3 F (36.8 C) (Oral)   Resp 19   LMP 07/06/2017   SpO2 100%   Physical Exam  Constitutional: She is oriented to person, place,  and time. Vital signs are normal. She appears well-developed and well-nourished.  Non-toxic appearance. No distress.  Afebrile, nontoxic, NAD  HENT:  Head: Normocephalic and atraumatic.  Mouth/Throat: Oropharynx is clear and moist and mucous membranes are normal.  Eyes: Conjunctivae and EOM are normal. Right eye exhibits no discharge. Left eye exhibits no discharge.  Neck: Normal range of motion. Neck supple.  Cardiovascular: Normal rate, regular rhythm, normal heart sounds and intact distal pulses.  Exam reveals no gallop and no friction rub.   No murmur heard. Pulmonary/Chest: Effort normal and breath sounds normal. No respiratory distress. She has no decreased breath sounds. She has no wheezes. She has no rhonchi. She has no rales.  Abdominal: Soft. Normal appearance and bowel sounds are normal. She exhibits no distension. There is tenderness in the right upper quadrant and epigastric area. There is positive Murphy's sign. There is no rigidity, no rebound, no guarding, no CVA tenderness and no tenderness at McBurney's point.  Soft, nondistended, +BS throughout, with RUQ and epigastric TTP, no r/g/r, +murphy's, neg mcburney's, no CVA TTP   Musculoskeletal: Normal  range of motion.  Neurological: She is alert and oriented to person, place, and time. She has normal strength. No sensory deficit.  Skin: Skin is warm, dry and intact. No rash noted.  Psychiatric: She has a normal mood and affect.  Nursing note and vitals reviewed.    ED Treatments / Results  Labs (all labs ordered are listed, but only abnormal results are displayed) Labs Reviewed  URINALYSIS, ROUTINE W REFLEX MICROSCOPIC - Abnormal; Notable for the following:       Result Value   Hgb urine dipstick MODERATE (*)    Bacteria, UA RARE (*)    Squamous Epithelial / LPF 0-5 (*)    All other components within normal limits  LIPASE, BLOOD  COMPREHENSIVE METABOLIC PANEL  CBC  I-STAT BETA HCG BLOOD, ED ([facility], [facility], [facility] ONLY)  I-STAT  TROPONIN, ED    EKG  EKG Interpretation  Date/Time:  Thursday August 06 2017 16:16:52 EDT Ventricular Rate:  82 PR Interval:    QRS Duration: 104 QT Interval:  382 QTC Calculation: 447 R Axis:   60 Text Interpretation:  Sinus rhythm No significant change was found Confirmed by Azalia Bilis ([id number]) on 08/06/2017 4:43:34 PM       Radiology US Abdomen Complete  Result Date: 08/06/2017 CLINICAL DATA:  Right upper quadrant and right flank pain. EXAM: ABDOMEN ULTRASOUND COMPLETE COMPARISON:  May 13, 2016 FINDINGS: Gallbladder: No gallstones or wall thickening visualized. No sonographic Murphy sign noted by sonographer. Common bile duct: Diameter: 3.4 mm Liver: Diffuse increased echogenicity with no focal mass. Portal vein is patent on color Doppler imaging with normal direction of blood flow towards the liver. IVC: No abnormality visualized. Pancreas: Visualized portion unremarkable. Spleen: Size and appearance within normal limits. Right Kidney: Length: 10.9 cm. Echogenicity within normal limits. No mass or hydronephrosis visualized. Left Kidney: Length: 11.3 cm. Echogenicity within normal limits. No mass or hydronephrosis visualized. Abdominal aorta: No aneurysm visualized. Other findings: None. IMPRESSION: 1. Probable hepatic steatosis. 2. No other abnormalities identified. Specifically, the gallbladder and right kidney are normal. Electronically Signed   By: Gerome Sam III M.D   On: 08/06/2017 17:11    Procedures Procedures (including critical care time)  Medications Ordered in ED Medications  morphine 4 MG/ML injection 4 mg (4 mg Intravenous Given 08/06/17 1625)  ondansetron (ZOFRAN-ODT) disintegrating tablet 8 mg (8 mg Oral Given 08/06/17 1610)  gi cocktail (Maalox,Lidocaine,Donnatal) (30 mLs Oral Given 08/06/17 1610)     Initial Impression / Assessment and Plan / ED Course  I have reviewed the triage vital signs and the nursing notes.  Pertinent labs & imaging results that  were available during my care of the patient were reviewed by me and considered in my medical decision making (see chart for details).     31 y.o. female here with 6 days of RUQ pain that radiates to R flank area, with associated nausea and occasional vomiting and chills. Has had issues for quite some time with this, but has never gotten concrete diagnosis. On exam, epigastric and RUQ TTP, +murphy's, nonperitoneal, no lower abd tenderness. Work up thus far: betaHCG neg, lipase WNL, CBC and CMP WNL. Will get U/A, trop, EKG, and abd U/S; will give GI cocktail, morphine, and zofran. Will reassess shortly.   5:42 PM Trop neg. EKG unremarkable. U/S with hepatic steatosis but otherwise unremarkable; no gallstones, no evidence of kidney stones. U/A with 0-5 squamous, neg nitrite/leuk, 0-5 WBC, rare bacteria, but 6-30 RBCs; doubt UTI, could just be  contaminant, and given duration of symptoms with lack of U/S findings to indicate kidney stone, highly doubt this as a possibility at this time; also appears that she's had hematuria on multiple prior visits; advised PCP f/up for evaluation of painless hematuria. Pt feeling much better and tolerating PO well at this time. Likely either biliary dyskinesia vs gastritis. Advised further outpatient testing like a HIDA scan may be indicated; f/up with PCP for this. Discussed diet/lifestyle modifications for symptoms, will start on zantac/zofran, advised tylenol and avoidance of NSAIDs, other OTC remedies for symptomatic relief, and f/up with PCP in 5-7 days for recheck of symptoms and ongoing evaluation/management. I explained the diagnosis and have given explicit precautions to return to the ER including for any other new or worsening symptoms. The patient understands and accepts the medical plan as it's been dictated and I have answered their questions. Discharge instructions concerning home care and prescriptions have been given. The patient is STABLE and is discharged to  home in good condition.    Final Clinical Impressions(s) / ED Diagnoses   Final diagnoses:  Colicky RUQ abdominal pain  Nausea and vomiting in adult patient  Gastritis, presence of bleeding unspecified, unspecified chronicity, unspecified gastritis type  Asymptomatic microscopic hematuria    New Prescriptions New Prescriptions   ONDANSETRON (ZOFRAN ODT) 4 MG DISINTEGRATING TABLET    Take 1 tablet (4 mg total) by mouth every 8 (eight) hours as needed for nausea or vomiting.   RANITIDINE (ZANTAC) 150 MG TABLET    Take 1 tablet (150 mg total) by mouth 2 (two) times daily.     [address] [place], [place] 08/06/17 1742    Azalia Bilis, MD 08/07/17 [phone]

## 2017-08-12 ENCOUNTER — Ambulatory Visit (INDEPENDENT_AMBULATORY_CARE_PROVIDER_SITE_OTHER): Payer: Managed Care, Other (non HMO) | Admitting: Adult Health

## 2017-08-12 ENCOUNTER — Encounter: Payer: Self-pay | Admitting: Adult Health

## 2017-08-12 VITALS — BP 119/79 | HR 92 | Ht 66.5 in | Wt 221.7 lb

## 2017-08-12 DIAGNOSIS — G43901 Migraine, unspecified, not intractable, with status migrainosus: Secondary | ICD-10-CM

## 2017-08-12 DIAGNOSIS — G8929 Other chronic pain: Secondary | ICD-10-CM | POA: Insufficient documentation

## 2017-08-12 DIAGNOSIS — Z0000 Encounter for general adult medical examination without abnormal findings: Secondary | ICD-10-CM | POA: Diagnosis not present

## 2017-08-12 DIAGNOSIS — R1011 Right upper quadrant pain: Secondary | ICD-10-CM | POA: Diagnosis not present

## 2017-08-12 NOTE — Assessment & Plan Note (Signed)
Continue to avoid foods that aggravate RUQ pain. GI referral placed, requested Dr. Loreta AveMann. Will defer to GI regarding the need for HIDA scan. If sx's become severe, seek immediate medical care.

## 2017-08-12 NOTE — Patient Instructions (Addendum)
Heart-Healthy Eating Plan Many factors influence your heart health, including eating and exercise habits. Heart (coronary) risk increases with abnormal blood fat (lipid) levels. Heart-healthy meal planning includes limiting unhealthy fats, increasing healthy fats, and making other small dietary changes. This includes maintaining a healthy body weight to help keep lipid levels within a normal range. What is my plan? Your health care provider recommends that you:  Get no more than _25__% of the total calories in your daily diet from fat.  Limit your intake of saturated fat to less than __5__% of your total calories each day.  Limit the amount of cholesterol in your diet to less than _300_ mg per day.  What types of fat should I choose?  Choose healthy fats more often. Choose monounsaturated and polyunsaturated fats, such as olive oil and canola oil, flaxseeds, walnuts, almonds, and seeds.  Eat more omega-3 fats. Good choices include salmon, mackerel, sardines, tuna, flaxseed oil, and ground flaxseeds. Aim to eat fish at least two times each week.  Limit saturated fats. Saturated fats are primarily found in animal products, such as meats, butter, and cream. Plant sources of saturated fats include palm oil, palm kernel oil, and coconut oil.  Avoid foods with partially hydrogenated oils in them. These contain trans fats. Examples of foods that contain trans fats are stick margarine, some tub margarines, cookies, crackers, and other baked goods. What general guidelines do I need to follow?  Check food labels carefully to identify foods with trans fats or high amounts of saturated fat.  Fill one half of your plate with vegetables and green salads. Eat 4-5 servings of vegetables per day. A serving of vegetables equals 1 cup of raw leafy vegetables,  cup of raw or cooked cut-up vegetables, or  cup of vegetable juice.  Fill one fourth of your plate with whole grains. Look for the word "whole" as  the first word in the ingredient list.  Fill one fourth of your plate with lean protein foods.  Eat 4-5 servings of fruit per day. A serving of fruit equals one medium whole fruit,  cup of dried fruit,  cup of fresh, frozen, or canned fruit, or  cup of 100% fruit juice.  Eat more foods that contain soluble fiber. Examples of foods that contain this type of fiber are apples, broccoli, carrots, beans, peas, and barley. Aim to get 20-30 g of fiber per day.  Eat more home-cooked food and less restaurant, buffet, and fast food.  Limit or avoid alcohol.  Limit foods that are high in starch and sugar.  Avoid fried foods.  Cook foods by using methods other than frying. Baking, boiling, grilling, and broiling are all great options. Other fat-reducing suggestions include: ? Removing the skin from poultry. ? Removing all visible fats from meats. ? Skimming the fat off of stews, soups, and gravies before serving them. ? Steaming vegetables in water or broth.  Lose weight if you are overweight. Losing just 5-10% of your initial body weight can help your overall health and prevent diseases such as diabetes and heart disease.  Increase your consumption of nuts, legumes, and seeds to 4-5 servings per week. One serving of dried beans or legumes equals  cup after being cooked, one serving of nuts equals 1 ounces, and one serving of seeds equals  ounce or 1 tablespoon.  You may need to monitor your salt (sodium) intake, especially if you have high blood pressure. Talk with your health care provider or dietitian to get  more information about reducing sodium. What foods can I eat? Grains  Breads, including [place], white, pita, wheat, raisin, rye, oatmeal, and [place]. Tortillas that are neither fried nor made with lard or trans fat. Low-fat rolls, including hotdog and hamburger buns and English muffins. Biscuits. Muffins. Waffles. Pancakes. Light popcorn. Whole-grain cereals. Flatbread. Melba toast.  Pretzels. Breadsticks. Rusks. Low-fat snacks and crackers, including oyster, saltine, matzo, graham, animal, and rye. Rice and pasta, including brown rice and those that are made with whole wheat. Vegetables All vegetables. Fruits All fruits, but limit coconut. Meats and Other Protein Sources Lean, well-trimmed beef, veal, pork, and lamb. Chicken and [place] without skin. All fish and shellfish. Wild duck, rabbit, pheasant, and venison. Egg whites or low-cholesterol egg substitutes. Dried beans, peas, lentils, and tofu.Seeds and most nuts. Dairy Low-fat or nonfat cheeses, including ricotta, string, and mozzarella. Skim or 1% milk that is liquid, powdered, or evaporated. Buttermilk that is made with low-fat milk. Nonfat or low-fat yogurt. Beverages Mineral water. Diet carbonated beverages. Sweets and Desserts Sherbets and fruit ices. Honey, jam, marmalade, jelly, and syrups. Meringues and gelatins. Pure sugar candy, such as hard candy, jelly beans, gumdrops, mints, marshmallows, and small amounts of dark chocolate. [employer] Eat all sweets and desserts in moderation. Fats and Oils Nonhydrogenated (trans-free) margarines. Vegetable oils, including soybean, sesame, sunflower, olive, peanut, safflower, corn, canola, and cottonseed. Salad dressings or mayonnaise that are made with a vegetable oil. Limit added fats and oils that you use for cooking, baking, salads, and as spreads. Other Cocoa powder. Coffee and tea. All seasonings and condiments. The items listed above may not be a complete list of recommended foods or beverages. Contact your dietitian for more options. What foods are not recommended? Grains Breads that are made with saturated or trans fats, oils, or whole milk. Croissants. Butter rolls. Cheese breads. Sweet rolls. Donuts. Buttered popcorn. Chow mein noodles. High-fat crackers, such as cheese or butter crackers. Meats and Other Protein Sources Fatty meats, such as hotdogs,  short ribs, sausage, spareribs, bacon, ribeye roast or steak, and mutton. High-fat deli meats, such as salami and bologna. Caviar. Domestic duck and goose. Organ meats, such as kidney, liver, sweetbreads, brains, gizzard, chitterlings, and heart. Dairy Cream, sour cream, cream cheese, and creamed cottage cheese. Whole milk cheeses, including blue (bleu), Monterey Jack, [place], [place], American, [place], Swiss, [place], [place], and [place]. Whole or 2% milk that is liquid, evaporated, or condensed. Whole buttermilk. Cream sauce or high-fat cheese sauce. Yogurt that is made from whole milk. Beverages Regular sodas and drinks with added sugar. Sweets and Desserts Frosting. Pudding. Cookies. Cakes other than angel food cake. Candy that has milk chocolate or white chocolate, hydrogenated fat, butter, coconut, or unknown ingredients. Buttered syrups. Full-fat ice cream or ice cream drinks. Fats and Oils Gravy that has suet, meat fat, or shortening. Cocoa butter, hydrogenated oils, palm oil, coconut oil, palm kernel oil. These can often be found in baked products, candy, fried foods, nondairy creamers, and whipped toppings. Solid fats and shortenings, including bacon fat, salt pork, lard, and butter. Nondairy cream substitutes, such as coffee creamers and sour cream substitutes. Salad dressings that are made of unknown oils, cheese, or sour cream. The items listed above may not be a complete list of foods and beverages to avoid. Contact your dietitian for more information. This information is not intended to replace advice given to you by your health care provider. Make sure you discuss any questions you have with your health care  provider. Document Released: 09/02/2008 Document Revised: 06/13/2016 Document Reviewed: 05/18/2014 Elsevier Interactive Patient Education  2017 [place].   Migraine Headache A migraine headache is an intense, throbbing pain on one side or both sides of the head. Migraines  may also cause other symptoms, such as nausea, vomiting, and sensitivity to light and noise. What are the causes? Doing or taking certain things may also trigger migraines, such as:  Alcohol.  Smoking.  Medicines, such as: ? Medicine used to treat chest pain (nitroglycerine). ? Birth control pills. ? Estrogen pills. ? Certain blood pressure medicines.  Aged cheeses, chocolate, or caffeine.  Foods or drinks that contain nitrates, glutamate, aspartame, or tyramine.  Physical activity.  Other things that may trigger a migraine include:  Menstruation.  Pregnancy.  Hunger.  Stress, lack of sleep, too much sleep, or fatigue.  Weather changes.  What increases the risk? The following factors may make you more likely to experience migraine headaches:  Age. Risk increases with age.  Family history of migraine headaches.  Being Caucasian.  Depression and anxiety.  Obesity.  Being a woman.  Having a hole in the heart (patent foramen ovale) or other heart problems.  What are the signs or symptoms? The main symptom of this condition is pulsating or throbbing pain. Pain may:  Happen in any area of the head, such as on one side or both sides.  Interfere with daily activities.  Get worse with physical activity.  Get worse with exposure to bright lights or loud noises.  Other symptoms may include:  Nausea.  Vomiting.  Dizziness.  General sensitivity to bright lights, loud noises, or smells.  Before you get a migraine, you may get warning signs that a migraine is developing (aura). An aura may include:  Seeing flashing lights or having blind spots.  Seeing bright spots, halos, or zigzag lines.  Having tunnel vision or blurred vision.  Having numbness or a tingling feeling.  Having trouble talking.  Having muscle weakness.  How is this diagnosed? A migraine headache can be diagnosed based on:  Your symptoms.  A physical exam.  Tests, such as CT  scan or MRI of the head. These imaging tests can help rule out other causes of headaches.  Taking fluid from the spine (lumbar puncture) and analyzing it (cerebrospinal fluid analysis, or CSF analysis).  How is this treated? A migraine headache is usually treated with medicines that:  Relieve pain.  Relieve nausea.  Prevent migraines from coming back.  Treatment may also include:  Acupuncture.  Lifestyle changes like avoiding foods that trigger migraines.  Follow these instructions at home: Medicines  Take over-the-counter and prescription medicines only as told by your health care provider.  Do not drive or use heavy machinery while taking prescription pain medicine.  To prevent or treat constipation while you are taking prescription pain medicine, your health care provider may recommend that you: ? Drink enough fluid to keep your urine clear or pale yellow. ? Take over-the-counter or prescription medicines. ? Eat foods that are high in fiber, such as fresh fruits and vegetables, whole grains, and beans. ? Limit foods that are high in fat and processed sugars, such as fried and sweet foods. Lifestyle  Avoid alcohol use.  Do not use any products that contain nicotine or tobacco, such as cigarettes and e-cigarettes. If you need help quitting, ask your health care provider.  Get at least 8 hours of sleep every night.  Limit your stress. General instructions  Keep a journal to find out what may trigger your migraine headaches. For example, write down: ? What you eat and drink. ? How much sleep you get. ? Any change to your diet or medicines.  If you have a migraine: ? Avoid things that make your symptoms worse, such as bright lights. ? It may help to lie down in a dark, quiet room. ? Do not drive or use heavy machinery. ? Ask your health care provider what activities are safe for you while you are experiencing symptoms.  Keep all follow-up visits as told by your  health care provider. This is important. Contact a health care provider if:  You develop symptoms that are different or more severe than your usual migraine symptoms. Get help right away if:  Your migraine becomes severe.  You have a fever.  You have a stiff neck.  You have vision loss.  Your muscles feel weak or like you cannot control them.  You start to lose your balance often.  You develop trouble walking.  You faint. This information is not intended to replace advice given to you by your health care provider. Make sure you discuss any questions you have with your health care provider. Document Released: 11/24/2005 Document Revised: 06/13/2016 Document Reviewed: 05/12/2016 Elsevier Interactive Patient Education  2017 [employer] Inc[employer]   Abdominal Pain, Adult Abdominal pain can be caused by many things. Often, abdominal pain is not serious and it gets better with no treatment or by being treated at home. However, sometimes abdominal pain is serious. Your health care provider will do a medical history and a physical exam to try to determine the cause of your abdominal pain. Follow these instructions at home:  Take over-the-counter and prescription medicines only as told by your health care provider. Do not take a laxative unless told by your health care provider.  Drink enough fluid to keep your urine clear or pale yellow.  Watch your condition for any changes.  Keep all follow-up visits as told by your health care provider. This is important. Contact a health care provider if:  Your abdominal pain changes or gets worse.  You are not hungry or you lose weight without trying.  You are constipated or have diarrhea for more than 2-3 days.  You have pain when you urinate or have a bowel movement.  Your abdominal pain wakes you up at night.  Your pain gets worse with meals, after eating, or with certain foods.  You are throwing up and cannot keep anything down.  You  have a fever. Get help right away if:  Your pain does not go away as soon as your health care provider told you to expect.  You cannot stop throwing up.  Your pain is only in areas of the abdomen, such as the right side or the left lower portion of the abdomen.  You have bloody or black stools, or stools that look like tar.  You have severe pain, cramping, or bloating in your abdomen.  You have signs of dehydration, such as: ? Dark urine, very little urine, or no urine. ? Cracked lips. ? Dry mouth. ? Sunken eyes. ? Sleepiness. ? Weakness. This information is not intended to replace advice given to you by your health care provider. Make sure you discuss any questions you have with your health care provider. Document Released: 09/03/2005 Document Revised: 06/13/2016 Document Reviewed: 05/07/2016 Elsevier Interactive Patient Education  2017 [employer] Inc.  Referral for GI specialist placed. Follow heart  healthy diet and strive to eat at least 3 small/moderate sized meals a day. Keep up the excellent hydration! Reduce-stop tobacco use. Continue with Neurologist as directed. Please schedule complete physical with fasting labs this fall. WELCOME TO THE PRACTICE!

## 2017-08-12 NOTE — Assessment & Plan Note (Signed)
Referral for GI specialist placed. Follow heart healthy diet and strive to eat at least 3 small/moderate sized meals a day. Keep up the excellent hydration! Reduce-stop tobacco use. Continue with Neurologist as directed. Please schedule complete physical with fasting labs this fall.

## 2017-08-12 NOTE — Assessment & Plan Note (Signed)
Continue Venlafaxine 150mg  daily and regular f/u by Neurologist. Continue excellent water intake and reduce-stop tobacco use.

## 2017-08-12 NOTE — Progress Notes (Signed)
Subjective:    Patient ID: Carrie Reed, female    DOB: 07/06/1986, 31 y.o.   MRN: [id number]  HPI:  Ms. Carrie Reed presents to establish as a new pt.  She is a very pleasant 31 year old female.  PMH: Migraine HAs-localized over L parietal area, sharp pain rated 10/10.  Last occurrence was >2 months ago.  Migraine prophlyaxis with Velnafaxine 150mg .  She is followed annually by Neurology.  She has has chronic RUQ pain that has been intermittent since 2006/2007.  She had EGD 12/2015: negative.  That was her last contact with GI specialist.  She was seen 08/07/17 for RUQ pain- full workup essentially negative, recommended to have HIDA scan.  RUQ pain will radiate to mid-sternum and often to R middle back. Pain is described as dull ache and currently rated 1/10.  She has identified that soda and heavy meals will worsen the sx's, water will decrease pain/discomfort.  She denies family hx of colon ca.  She denies current GERD sx's.  She has stopped consuming caffeine and eliminated all foods that will trigger RUQ.  She drinks >gallon/water day and eats only one meal in the late afternoon/evenig day.  She smoke 1/2 pack /day and rarely drinks EOTH. She also reports intermittent N/V that occurs several times/month. 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   Patient Care Team    Relationship Specialty Notifications Start End  Carrie Reed, Carrie Reed PCP - General Family Medicine  08/12/17   Carrie HeightJohnson, Andrea, PA-C Physician Assistant Family Medicine  08/12/17 08/12/17    Patient Active Problem List   Diagnosis Date Noted  . Chronic RUQ pain 08/12/2017  . Healthcare maintenance 08/12/2017  . Intractable chronic migraine without aura and without status migrainosus 04/27/2016  . Migraine 08/02/2015  . Pulsatile tinnitus of left ear 08/02/2015  . Blurry vision 08/02/2015  . Worsening headaches 08/02/2015     Past Medical History:  Diagnosis Date  . Bulging of cervical intervertebral disc   .  Migraine      Past Surgical History:  Procedure Laterality Date  . APPENDECTOMY    . TONSILLECTOMY AND ADENOIDECTOMY     x2  . TUBAL LIGATION    . TYMPANOSTOMY TUBE PLACEMENT    . UTERINE TEAR     d/t late stage abortion     Family History  Problem Relation Age of Onset  . Cirrhosis Mother   . Kidney disease Mother   . Diabetes Mother   . Stroke Mother   . Clotting disorder Mother   . Heart failure Father   . Heart disease Father   . Heart attack Father   . Diabetes Father   . Hyperlipidemia Father   . Cancer Sister        cervical cancer  . Hypertension Brother   . Clotting disorder Brother   . High blood pressure Brother   . Cervical cancer Sister   . Suicidality Maternal Uncle   . Suicidality Paternal Aunt   . Diabetes Paternal Grandmother   . Cancer Paternal Grandfather        throat  . Suicidality Cousin   . Migraines Neg Hx      History  Drug Use No     History  Alcohol Use  . 0.0 oz/week    Comment: social     History  Smoking Status  . Current Every Day Smoker  . Packs/day: 0.50  . Years: 12.00  . Types: Cigarettes  Smokeless Tobacco  . Never Used     Outpatient Encounter Prescriptions as of 08/12/2017  Medication Sig  . eletriptan (RELPAX) 40 MG tablet Take 1 tablet (40 mg total) by mouth as needed for migraine or headache. May repeat in 2 hours. Max twice in a day.  . ondansetron (ZOFRAN ODT) 4 MG disintegrating tablet Take 1 tablet (4 mg total) by mouth every 8 (eight) hours as needed for nausea or vomiting.  . venlafaxine XR (EFFEXOR-XR) 150 MG 24 hr capsule TAKE 1 CAPSULE BY MOUTH DAILY WITH BREAKFAST.  . [DISCONTINUED] gabapentin (NEURONTIN) 300 MG capsule Take 1 capsule (300 mg total) by mouth 3 (three) times daily.  . [DISCONTINUED] Gabapentin, Once-Daily, (GRALISE) 600 MG TABS Take 600 mg by mouth at bedtime.  . [DISCONTINUED] ranitidine (ZANTAC) 150 MG tablet Take 1 tablet (150 mg total) by mouth 2 (two) times daily.  .  [DISCONTINUED] tiZANidine (ZANAFLEX) 2 MG tablet TAKE 1 TABLET (2 MG TOTAL) BY MOUTH 3 (THREE) TIMES DAILY.   No facility-administered encounter medications on file as of 08/12/2017.     Allergies: Propranolol  Body mass index is 35.25 kg/m.  Blood pressure 119/79, pulse 92, height 5' 6.5" (1.689 m), weight 221 lb 11.2 oz (100.6 kg), last menstrual period 07/04/2017.     Review of Systems  Constitutional: Positive for fatigue. Negative for activity change, appetite change, chills, diaphoresis, fever and unexpected weight change.  Eyes: Negative for visual disturbance.  Respiratory: Negative for cough, chest tightness, shortness of breath, wheezing and stridor.   Cardiovascular: Negative for chest pain, palpitations and leg swelling.  Gastrointestinal: Positive for abdominal pain, constipation, diarrhea, nausea and vomiting. Negative for abdominal distention and blood in stool.  Endocrine: Negative for cold intolerance, heat intolerance, polydipsia, polyphagia and polyuria.  Genitourinary: Negative for difficulty urinating, flank pain and hematuria.  Musculoskeletal: Negative for arthralgias, back pain, gait problem, joint swelling, myalgias, neck pain and neck stiffness.  Skin: Negative for color change, pallor, rash and wound.  Neurological: Positive for headaches. Negative for dizziness, tremors and weakness.  Hematological: Bruises/bleeds easily.  Psychiatric/Behavioral: Positive for sleep disturbance. Negative for confusion, decreased concentration, dysphoric mood, hallucinations, self-injury and suicidal ideas. The patient is not nervous/anxious and is not hyperactive.        Objective:   Physical Exam  Constitutional: She appears well-developed and well-nourished. No distress.  HENT:  Head: Normocephalic and atraumatic.  Right Ear: External ear normal.  Left Ear: External ear normal.  Eyes: Pupils are equal, round, and reactive to light. Conjunctivae are normal.  Neck:  Normal range of motion. Neck supple.  Cardiovascular: Normal rate, regular rhythm, normal heart sounds and intact distal pulses.   No murmur heard. Pulmonary/Chest: Effort normal and breath sounds normal. No respiratory distress. She has no wheezes. She exhibits no tenderness.  Abdominal: Soft. Bowel sounds are normal. She exhibits no distension and no mass. There is tenderness in the right upper quadrant. There is positive Murphy's sign. There is no rigidity, no rebound, no guarding and no CVA tenderness.  Lymphadenopathy:    She has no cervical adenopathy.  Skin: She is not diaphoretic.  Nursing note and vitals reviewed.         Assessment & Plan:   1. Chronic RUQ pain   2. Migraine with status migrainosus, not intractable, unspecified migraine type   3. Healthcare maintenance     Chronic RUQ pain Continue to avoid foods that aggravate RUQ pain. GI referral placed, requested Dr. Loreta Ave. Will defer to  GI regarding the need for HIDA scan. If sx's become severe, seek immediate medical care.  Migraine Continue Venlafaxine 150mg  daily and regular f/u by Neurologist. Continue excellent water intake and reduce-stop tobacco use.  Healthcare maintenance Referral for GI specialist placed. Follow heart healthy diet and strive to eat at least 3 small/moderate sized meals a day. Keep up the excellent hydration! Reduce-stop tobacco use. Continue with Neurologist as directed. Please schedule complete physical with fasting labs this fall.    FOLLOW-UP:  Return in about 3 months (around 11/11/2017) for CPE, Fasting Lab Draw.

## 2017-08-12 NOTE — Assessment & Plan Note (Signed)
>>  ASSESSMENT AND PLAN FOR MIGRAINE WRITTEN ON 08/12/2017 10:36 AM BY DANFORD, KATY D, NP  Continue Venlafaxine 150mg  daily and regular f/u by Neurologist. Continue excellent water intake and reduce-stop tobacco use.

## 2017-08-12 NOTE — Assessment & Plan Note (Signed)
>>  ASSESSMENT AND PLAN FOR MIGRAINE WITHOUT AURA AND WITHOUT STATUS MIGRAINOSUS, NOT INTRACTABLE WRITTEN ON 03/11/2023  8:09 AM BY Kenadee Gates A, PA  >>ASSESSMENT AND PLAN FOR MIGRAINE WRITTEN ON 08/12/2017 10:36 AM BY DANFORD, KATY D, NP  Continue Venlafaxine 150mg  daily and regular f/u by Neurologist. Continue excellent water intake and reduce-stop tobacco use.

## 2017-08-25 ENCOUNTER — Other Ambulatory Visit: Payer: Self-pay | Admitting: Gastroenterology

## 2017-08-25 DIAGNOSIS — R1011 Right upper quadrant pain: Secondary | ICD-10-CM

## 2017-08-25 NOTE — Progress Notes (Signed)
Carrie Vasseur MD 

## 2017-09-07 ENCOUNTER — Encounter (HOSPITAL_COMMUNITY)
Admission: RE | Admit: 2017-09-07 | Discharge: 2017-09-07 | Disposition: A | Discharge status: Home / Self Care | Payer: Managed Care, Other (non HMO) | Source: Ambulatory Visit | Attending: Gastroenterology | Admitting: Gastroenterology

## 2017-09-07 DIAGNOSIS — R1011 Right upper quadrant pain: Secondary | ICD-10-CM | POA: Insufficient documentation

## 2017-09-07 MED ORDER — TECHNETIUM TC 99M MEBROFENIN IV KIT
5.0500 | PACK | Freq: Once | INTRAVENOUS | Status: AC
  Administered 2017-09-07: 5.05 via INTRAVENOUS

## 2017-11-08 NOTE — Progress Notes (Addendum)
Subjective:    Patient ID: Arvin CollardJamie Malkowski, female    DOB: 01/13/1986, 31 y.o.   MRN: [id number]  HPI:  08/12/17 OV: [name] presents to establish as a new pt.  She is a very pleasant 31 year old female.  PMH: Migraine HAs-localized over L parietal area, sharp pain rated 10/10.  Last occurrence was >2 months ago.  Migraine prophlyaxis with Velnafaxine 150mg .  She is followed annually by Neurology.  She has has chronic RUQ pain that has been intermittent since 2006/2007.  She had EGD 12/2015: negative.  That was her last contact with GI specialist.  She was seen 08/07/17 for RUQ pain- full workup essentially negative, recommended to have HIDA scan.  RUQ pain will radiate to mid-sternum and often to R middle back. Pain is described as dull ache and currently rated 1/10.  She has identified that soda and heavy meals will worsen the sx's, water will decrease pain/discomfort.  She denies family hx of colon ca.  She denies current GERD sx's.  She has stopped consuming caffeine and eliminated all foods that will trigger RUQ.  She drinks >gallon/water day and eats only one meal in the late afternoon/evenig day.  She smoke 1/2 pack /day and rarely drinks EOTH. She also reports intermittent N/V that occurs several times/month. 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  11/11/17 OV: [name] is here for copious thick/yellow nasal drainage, constant non-productive cough, bil ear pain (3/10), L parietal HA (2/10), and extreme fatigue.  She reports that all of her children have been acutely ill in the last 2 weeks.  She estimates that her sx's have been present >1.5 weeks and are worsening daily.  She has been increasing water and trying to rest as much as she can.   Patient Care Team    Relationship Specialty Notifications Start End  Julaine Fusianford, Yamilka Lopiccolo D, NP PCP - General Family Medicine  08/12/17     Patient Active Problem List   Diagnosis Date Noted  . Acute maxillary sinusitis 11/11/2017  .  Cough 11/11/2017  . Chronic RUQ pain 08/12/2017  . Healthcare maintenance 08/12/2017  . Intractable chronic migraine without aura and without status migrainosus 04/27/2016  . Migraine 08/02/2015  . Pulsatile tinnitus of left ear 08/02/2015  . Blurry vision 08/02/2015  . Worsening headaches 08/02/2015     Past Medical History:  Diagnosis Date  . Bulging of cervical intervertebral disc   . Migraine      Past Surgical History:  Procedure Laterality Date  . APPENDECTOMY    . TONSILLECTOMY AND ADENOIDECTOMY     x2  . TUBAL LIGATION    . TYMPANOSTOMY TUBE PLACEMENT    . UTERINE TEAR     d/t late stage abortion     Family History  Problem Relation Age of Onset  . Cirrhosis Mother   . Kidney disease Mother   . Diabetes Mother   . Stroke Mother   . Clotting disorder Mother   . Heart failure Father   . Heart disease Father   . Heart attack Father   . Diabetes Father   . Hyperlipidemia Father   . Cancer Sister        cervical cancer  . Hypertension Brother   . Clotting disorder Brother   . High blood pressure Brother   . Cervical cancer Sister   . Suicidality Maternal Uncle   . Suicidality Paternal Aunt   . Diabetes Paternal Grandmother   .  Cancer Paternal Grandfather        throat  . Suicidality Cousin   . Migraines Neg Hx      Social History   Substance and Sexual Activity  Drug Use No     Social History   Substance and Sexual Activity  Alcohol Use Yes  . Alcohol/week: 0.0 oz   Comment: social     Social History   Tobacco Use  Smoking Status Current Every Day Smoker  . Packs/day: 0.50  . Years: 12.00  . Pack years: 6.00  . Types: Cigarettes  Smokeless Tobacco Never Used     Outpatient Encounter Medications as of 11/11/2017  Medication Sig  . ondansetron (ZOFRAN ODT) 4 MG disintegrating tablet Take 1 tablet (4 mg total) by mouth every 8 (eight) hours as needed for nausea or vomiting.  . venlafaxine XR (EFFEXOR-XR) 150 MG 24 hr capsule TAKE  1 CAPSULE BY MOUTH DAILY WITH BREAKFAST.  Marland Kitchen. benzonatate (TESSALON) 200 MG capsule Take 1 capsule (200 mg total) by mouth 2 (two) times daily as needed for cough.  . cefdinir (OMNICEF) 300 MG capsule Take 1 capsule (300 mg total) by mouth 2 (two) times daily.  . fluticasone (FLONASE) 50 MCG/ACT nasal spray Place 2 sprays into both nostrils daily.  . [DISCONTINUED] eletriptan (RELPAX) 40 MG tablet Take 1 tablet (40 mg total) by mouth as needed for migraine or headache. May repeat in 2 hours. Max twice in a day.   No facility-administered encounter medications on file as of 11/11/2017.     Allergies: Propranolol  Body mass index is 36.98 kg/m.  Blood pressure 130/83, pulse 94, temperature 98.7 F (37.1 C), temperature source Oral, height 5' 6.5" (1.689 m), weight 232 lb 9.6 oz (105.5 kg), last menstrual period 10/09/2017, SpO2 97 %.     Review of Systems  Constitutional: Positive for fatigue. Negative for activity change, appetite change, chills, diaphoresis, fever and unexpected weight change.  HENT: Positive for congestion, ear pain, postnasal drip, rhinorrhea, sinus pressure, sinus pain, sore throat and voice change. Negative for trouble swallowing.   Eyes: Negative for visual disturbance.  Respiratory: Positive for cough. Negative for chest tightness, shortness of breath, wheezing and stridor.   Cardiovascular: Negative for chest pain, palpitations and leg swelling.  Gastrointestinal: Negative for abdominal distention, abdominal pain, blood in stool, constipation, diarrhea, nausea and vomiting.  Endocrine: Negative for cold intolerance, heat intolerance, polydipsia, polyphagia and polyuria.  Genitourinary: Negative for difficulty urinating, flank pain and hematuria.  Musculoskeletal: Negative for arthralgias, back pain, gait problem, joint swelling, myalgias, neck pain and neck stiffness.  Skin: Negative for color change, pallor, rash and wound.  Neurological: Positive for headaches.  Negative for dizziness, tremors and weakness.  Hematological: Does not bruise/bleed easily.  Psychiatric/Behavioral: Positive for sleep disturbance. Negative for confusion, decreased concentration, dysphoric mood, hallucinations, self-injury and suicidal ideas. The patient is not nervous/anxious and is not hyperactive.        Objective:   Physical Exam  Constitutional: She is oriented to person, place, and time. She appears well-developed and well-nourished. No distress.  HENT:  Head: Normocephalic and atraumatic.  Right Ear: Hearing, external ear and ear canal normal. Tympanic membrane is erythematous and bulging. No decreased hearing is noted.  Left Ear: Hearing, external ear and ear canal normal. Tympanic membrane is erythematous and bulging. No decreased hearing is noted.  Nose: Mucosal edema and rhinorrhea present. Right sinus exhibits maxillary sinus tenderness. Right sinus exhibits no frontal sinus tenderness. Left sinus exhibits maxillary  sinus tenderness. Left sinus exhibits no frontal sinus tenderness.  Mouth/Throat: Uvula is midline and mucous membranes are normal. Posterior oropharyngeal edema and posterior oropharyngeal erythema present. No oropharyngeal exudate or tonsillar abscesses.  Eyes: Conjunctivae are normal. Pupils are equal, round, and reactive to light.  Neck: Normal range of motion. Neck supple.  Cardiovascular: Normal rate, regular rhythm, normal heart sounds and intact distal pulses.  No murmur heard. Pulmonary/Chest: Effort normal and breath sounds normal. No respiratory distress. She has no wheezes. She exhibits no tenderness.  Abdominal: Soft. Bowel sounds are normal. There is no CVA tenderness.  Lymphadenopathy:    She has no cervical adenopathy.  Neurological: She is alert and oriented to person, place, and time.  Skin: Skin is warm and dry. No rash noted. She is not diaphoretic. No erythema. No pallor.  Psychiatric: She has a normal mood and affect. Her  behavior is normal. Judgment and thought content normal.  Nursing note and vitals reviewed.         Assessment & Plan:   1. Acute maxillary sinusitis, recurrence not specified   2. Cough   3. Healthcare maintenance     Healthcare maintenance Please re-schedule your physical with fasting labs once you feel better.  Cough Tessalon 200mg  cap PRN cough STOP TOBACCO USE  Acute maxillary sinusitis She has failed Augmentin in past Started on Cefdinir 300mg  BID x 10 days Increase fluids/rest/vit c (2000mg /day) Work excuse provided, out until 11/13/17 Alternate OTC Acetaminophen and Ibuprofen as needed for headache/fever/body aches. If symptoms persist after antibiotic completed, please call clinic.    FOLLOW-UP:  Return if symptoms worsen or fail to improve. 1. Acute maxillary sinusitis, recurrence not specified   2. Cough   3. Healthcare maintenance     Healthcare maintenance Please re-schedule your physical with fasting labs once you feel better.  Cough Tessalon 200mg  cap PRN cough STOP TOBACCO USE  Acute maxillary sinusitis She has failed Augmentin in past Started on Cefdinir 300mg  BID x 10 days Increase fluids/rest/vit c (2000mg /day) Work excuse provided, out until 11/13/17 Alternate OTC Acetaminophen and Ibuprofen as needed for headache/fever/body aches. If symptoms persist after antibiotic completed, please call clinic.    FOLLOW-UP:  Return if symptoms worsen or fail to improve.

## 2017-11-10 ENCOUNTER — Other Ambulatory Visit: Payer: Self-pay

## 2017-11-10 DIAGNOSIS — Z0000 Encounter for general adult medical examination without abnormal findings: Secondary | ICD-10-CM

## 2017-11-10 NOTE — Progress Notes (Signed)
Cbc

## 2017-11-11 ENCOUNTER — Encounter: Payer: Self-pay | Admitting: Adult Health

## 2017-11-11 ENCOUNTER — Ambulatory Visit (INDEPENDENT_AMBULATORY_CARE_PROVIDER_SITE_OTHER): Payer: Managed Care, Other (non HMO) | Admitting: Adult Health

## 2017-11-11 VITALS — BP 130/83 | HR 94 | Temp 98.7°F | Ht 66.5 in | Wt 232.6 lb

## 2017-11-11 DIAGNOSIS — R05 Cough: Secondary | ICD-10-CM | POA: Diagnosis not present

## 2017-11-11 DIAGNOSIS — J0100 Acute maxillary sinusitis, unspecified: Secondary | ICD-10-CM

## 2017-11-11 DIAGNOSIS — R059 Cough, unspecified: Secondary | ICD-10-CM | POA: Insufficient documentation

## 2017-11-11 DIAGNOSIS — Z0000 Encounter for general adult medical examination without abnormal findings: Secondary | ICD-10-CM | POA: Diagnosis not present

## 2017-11-11 MED ORDER — CEFDINIR 300 MG PO CAPS: 300.0000 mg | ORAL_CAPSULE | Freq: Two times a day (BID) | ORAL | 0 refills | Status: DC

## 2017-11-11 MED ORDER — FLUTICASONE PROPIONATE 50 MCG/ACT NA SUSP: 2.0000 | Freq: Every day | NASAL | 6 refills | Status: DC

## 2017-11-11 MED ORDER — BENZONATATE 200 MG PO CAPS: 200.0000 mg | ORAL_CAPSULE | Freq: Two times a day (BID) | ORAL | 0 refills | Status: DC | PRN

## 2017-11-11 NOTE — Assessment & Plan Note (Signed)
Please re-schedule your physical with fasting labs once you feel better.

## 2017-11-11 NOTE — Patient Instructions (Signed)

## 2017-11-11 NOTE — Assessment & Plan Note (Signed)
She has failed Augmentin in past Started on Cefdinir 300mg  BID x 10 days Increase fluids/rest/vit c (2000mg /day) Work excuse provided, out until 11/13/17 Alternate OTC Acetaminophen and Ibuprofen as needed for headache/fever/body aches. If symptoms persist after antibiotic completed, please call clinic.

## 2017-11-11 NOTE — Assessment & Plan Note (Signed)
Tessalon 200mg  cap PRN cough STOP TOBACCO USE

## 2017-12-03 ENCOUNTER — Emergency Department (HOSPITAL_COMMUNITY)
Admission: EM | Admit: 2017-12-03 | Discharge: 2017-12-03 | Disposition: A | Discharge status: Home / Self Care | Payer: Managed Care, Other (non HMO) | Attending: Emergency Medicine | Admitting: Emergency Medicine

## 2017-12-03 ENCOUNTER — Other Ambulatory Visit: Payer: Managed Care, Other (non HMO)

## 2017-12-03 ENCOUNTER — Emergency Department (HOSPITAL_COMMUNITY): Payer: Managed Care, Other (non HMO)

## 2017-12-03 DIAGNOSIS — Z79899 Other long term (current) drug therapy: Secondary | ICD-10-CM | POA: Diagnosis not present

## 2017-12-03 DIAGNOSIS — M5441 Lumbago with sciatica, right side: Secondary | ICD-10-CM | POA: Diagnosis not present

## 2017-12-03 DIAGNOSIS — Z0000 Encounter for general adult medical examination without abnormal findings: Secondary | ICD-10-CM

## 2017-12-03 DIAGNOSIS — M545 Low back pain: Secondary | ICD-10-CM | POA: Diagnosis present

## 2017-12-03 DIAGNOSIS — M5442 Lumbago with sciatica, left side: Secondary | ICD-10-CM | POA: Diagnosis not present

## 2017-12-03 DIAGNOSIS — M25552 Pain in left hip: Secondary | ICD-10-CM | POA: Insufficient documentation

## 2017-12-03 DIAGNOSIS — M25551 Pain in right hip: Secondary | ICD-10-CM | POA: Diagnosis not present

## 2017-12-03 DIAGNOSIS — F17210 Nicotine dependence, cigarettes, uncomplicated: Secondary | ICD-10-CM | POA: Diagnosis not present

## 2017-12-03 LAB — POC URINE PREG, ED: Preg Test, Ur: NEGATIVE

## 2017-12-03 MED ORDER — METHYLPREDNISOLONE 4 MG PO TBPK: ORAL_TABLET | ORAL | 0 refills | Status: DC

## 2017-12-03 MED ORDER — CYCLOBENZAPRINE HCL 10 MG PO TABS: 10.0000 mg | ORAL_TABLET | Freq: Two times a day (BID) | ORAL | 0 refills | Status: DC | PRN

## 2017-12-03 MED ORDER — KETOROLAC TROMETHAMINE 30 MG/ML IJ SOLN
15.0000 mg | Freq: Once | INTRAMUSCULAR | Status: AC
  Administered 2017-12-03: 15 mg via INTRAMUSCULAR
  Filled 2017-12-03: qty 1

## 2017-12-03 MED ORDER — OXYCODONE-ACETAMINOPHEN 5-325 MG PO TABS
1.0000 | ORAL_TABLET | Freq: Once | ORAL | Status: AC
  Administered 2017-12-03: 1 via ORAL
  Filled 2017-12-03: qty 1

## 2017-12-03 NOTE — Discharge Instructions (Signed)
We had discussed her CT findings with you.  Do have some herniated disc which could be causing nerve pain that is causing her symptoms.  I would recommend performing the stretches as prescribed.  Continue alternating heat and ice on the affected area.  Have given you lidocaine patches to use over the affected area to help numb the pain.  Take the Medrol Dosepak to help with inflammation.  Take the Flexeril to help with muscle spasms this medication make you drowsy so do not drive with it.  Continue taking NSAIDs and Tylenol over-the-counter for pain.  Have given you follow-up with spine surgery and with orthopedics.  I would also follow-up with your primary care doctor.  You will likely need MRI in the outpatient setting.  Workup has been normal. Please take medications as prescribed and instructed.  SEEK IMMEDIATE MEDICAL ATTENTION IF: New numbness, tingling, weakness, or problem with the use of your arms or legs.  Severe back pain not relieved with medications.  Change in bowel or bladder control.  Urinary retention.  Numbness in your groin.  Increasing pain in any areas of the body (such as chest or abdominal pain).  Shortness of breath, dizziness or fainting.  Nausea (feeling sick to your stomach), vomiting, fever, or sweats.

## 2017-12-03 NOTE — ED Notes (Signed)
Patient transported to CT 

## 2017-12-03 NOTE — ED Triage Notes (Signed)
Pt complains of bilateral hip pain x 6 days. Pt states her left leg felt numb last night, now feels tingling in her left leg. Pt states she has had issues in past with hips but not for this long.

## 2017-12-03 NOTE — ED Notes (Signed)
Pt ambulatory and independent at discharge.  Verbalized understanding of discharge instructions 

## 2017-12-03 NOTE — ED Provider Notes (Signed)
[facility]-EMERGENCY DEPT Provider Note   CSN: [id number] Arrival date & time: 12/03/17  1258     History   Chief Complaint Chief Complaint  Patient presents with  . Hip Pain    bilateral     HPI Carrie Reed is a 31 y.o. female.  HPI 31 year old Caucasian female with no significant past medical history presents to the emergency department today with complaints of low back pain and bilateral hip pain.  The patient states the pain is been ongoing for the past 6 days but progressively worsening.  She reports intermittent paresthesias to the lower extremities.  States that last night her left leg felt numb and she woke up this morning having tingling her left leg which has since resolved.  Patient states that she has had these issues in the past with her hips but not for this long.  Reports that positioning with prolonged sitting and standing makes the pain worse.  States she is able to ambulate.  Feels like her hips are grinding.  Patient has tried Aleve over-the-counter with only little relief.  Patient denies any history of chronic back problems or bulging disc however she does have documented her history of herniated disc.  Patient denies any recent injury. Pt denies any ha, night sweats, hx of ivdu/cancer, loss or bowel or bladder, urinary retention, saddle paresthesias, lower extremity paresthesias.  Patient denies any associated fever, chills, urinary symptoms, abdominal pain, vaginal symptoms.  No lower extremity edema or calf tenderness.   Past Medical History:  Diagnosis Date  . Bulging of cervical intervertebral disc   . Migraine     Patient Active Problem List   Diagnosis Date Noted  . Acute maxillary sinusitis 11/11/2017  . Cough 11/11/2017  . Chronic RUQ pain 08/12/2017  . Healthcare maintenance 08/12/2017  . Intractable chronic migraine without aura and without status migrainosus 04/27/2016  . Migraine 08/02/2015  . Pulsatile tinnitus of left  ear 08/02/2015  . Blurry vision 08/02/2015  . Worsening headaches 08/02/2015    Past Surgical History:  Procedure Laterality Date  . APPENDECTOMY    . TONSILLECTOMY AND ADENOIDECTOMY     x2  . TUBAL LIGATION    . TYMPANOSTOMY TUBE PLACEMENT    . UTERINE TEAR     d/t late stage abortion    OB History    No data available       Home Medications    Prior to Admission medications   Medication Sig Start Date End Date Taking? Authorizing Provider  benzonatate (TESSALON) 200 MG capsule Take 1 capsule (200 mg total) by mouth 2 (two) times daily as needed for cough. 11/11/17  Yes Danford, Orpha Bur D, NP  cefdinir (OMNICEF) 300 MG capsule Take 1 capsule (300 mg total) by mouth 2 (two) times daily. 11/11/17  Yes Danford, Orpha Bur D, NP  fluticasone (FLONASE) 50 MCG/ACT nasal spray Place 2 sprays into both nostrils daily. 11/11/17  Yes Danford, Orpha Bur D, NP  naproxen sodium (ALEVE) 220 MG tablet Take 440 mg by mouth daily as needed (MIGRAINE).   Yes [provider]  ondansetron (ZOFRAN ODT) 4 MG disintegrating tablet Take 1 tablet (4 mg total) by mouth every 8 (eight) hours as needed for nausea or vomiting. 08/06/17  Yes Street, [place], PA-C  venlafaxine XR (EFFEXOR-XR) 150 MG 24 hr capsule TAKE 1 CAPSULE BY MOUTH DAILY WITH BREAKFAST. 12/25/16  Yes Anson Fret, MD    Family History Family History  Problem Relation Age of Onset  .  Cirrhosis Mother   . Kidney disease Mother   . Diabetes Mother   . Stroke Mother   . Clotting disorder Mother   . Heart failure Father   . Heart disease Father   . Heart attack Father   . Diabetes Father   . Hyperlipidemia Father   . Cancer Sister        cervical cancer  . Hypertension Brother   . Clotting disorder Brother   . High blood pressure Brother   . Cervical cancer Sister   . Suicidality Maternal Uncle   . Suicidality Paternal Aunt   . Diabetes Paternal Grandmother   . Cancer Paternal Grandfather        throat  . Suicidality Cousin     . Migraines Neg Hx     Social History Social History   Tobacco Use  . Smoking status: Current Every Day Smoker    Packs/day: 0.50    Years: 12.00    Pack years: 6.00    Types: Cigarettes  . Smokeless tobacco: Never Used  Substance Use Topics  . Alcohol use: Yes    Alcohol/week: 0.0 oz    Comment: social  . Drug use: No     Allergies   Propranolol   Review of Systems Review of Systems  Constitutional: Negative for chills and fever.  Cardiovascular: Negative for leg swelling.  Gastrointestinal: Negative for abdominal pain and vomiting.  Musculoskeletal: Positive for arthralgias, back pain, gait problem, joint swelling and myalgias. Negative for neck pain and neck stiffness.  Skin: Negative for color change, rash and wound.  Neurological: Positive for numbness. Negative for weakness.     Physical Exam Updated Vital Signs BP (!) 155/102 (BP Location: Right Arm) Comment: 157/87 on left arm  Pulse 92   Temp 98.6 F (37 C) (Oral)   Resp 18   Ht 5\' 7"  (1.702 m)   Wt 101.3 kg (223 lb 5 oz)   LMP 11/26/2017   SpO2 99%   BMI 34.98 kg/m   Physical Exam  Constitutional: She is oriented to person, place, and time. She appears well-developed and well-nourished. No distress.  HENT:  Head: Normocephalic and atraumatic.  Eyes: Right eye exhibits no discharge. Left eye exhibits no discharge. No scleral icterus.  Neck: Normal range of motion.  Cardiovascular: Intact distal pulses.  Pulmonary/Chest: No respiratory distress.  Musculoskeletal: Normal range of motion.  Patient has no midline T-spine tenderness.  She does have some midline L-spine tenderness.  Paraspinal tenderness noted.  Positive straight leg raise test bilaterally.  No deformities, step-offs, ecchymosis, erythema appreciated.  Patient is tender to palpation over the bilateral buttocks.  She has no lower extremity edema or calf tenderness.  Cap refill.  DP pulses 2+ bilaterally.  Sensation intact.  Patient  with pain with range of motion of the bilateral hips.  There is no lower extremity shortening or rotation.  No erythema, warmth over the joints.  Neurological: She is alert and oriented to person, place, and time.  Strength 5 out of 5 in lower extremities however this does cause pain.  Patellar reflexes are normal.  Sensation intact in all dermatomes of lower extremities.  Patient is able to ambulate with normal gait.  Skin: Skin is warm and dry. Capillary refill takes less than 2 seconds. No pallor.  Psychiatric: Her behavior is normal. Judgment and thought content normal.  Nursing note and vitals reviewed.    ED Treatments / Results  Labs (all labs ordered are listed, but only  abnormal results are displayed) Labs Reviewed  POC URINE PREG, ED    EKG  EKG Interpretation None       Radiology Ct Lumbar Spine Wo Contrast  Result Date: 12/03/2017 CLINICAL DATA:  Bilateral hip pain for 6 days. Left leg numbness and tingling. EXAM: CT LUMBAR SPINE WITHOUT CONTRAST TECHNIQUE: Multidetector CT imaging of the lumbar spine was performed without intravenous contrast administration. Multiplanar CT image reconstructions were also generated. COMPARISON:  CT abdomen and pelvis 05/13/2016. Chest radiographs 05/13/2016. FINDINGS: Segmentation: There are 6 non rib-bearing lumbar type vertebrae. Comparison chest radiographs demonstrate 12 full sized pairs of ribs. The lowest lumbar type vertebrae will be considered a lumbarized S1. Alignment: Trace retrolisthesis of L4 on L5. Minimal left convex lumbar curvature with apex at L3-4. Vertebrae: Preserved vertebral body heights. No evidence of acute fracture or destructive osseous process. Unchanged subcentimeter sclerotic focus in the right ilium, possibly a bone island. Sclerosis along both sides of the SI joints bilaterally. Paraspinal and other soft tissues: Postsurgical changes related to prior appendectomy. Disc levels: T12-L1 through L3-4:  Negative.  L4-5:  Mild disc bulging without evidence of significant stenosis. L5-S1: A right foraminal to extraforaminal disc protrusion results in mild right neural foraminal stenosis. Mild facet hypertrophy. No evidence of significant spinal stenosis. S1-2:  Negative. IMPRESSION: 1. Transitional lumbosacral anatomy. 2. No evidence of acute osseous abnormality. 3. Mild right neural foraminal stenosis at L5-S1 due to a disc protrusion. 4. Mild disc bulging at L4-5 without stenosis. Electronically Signed   By: Sebastian AcheAllen  Grady M.D.   On: 12/03/2017 18:11   Ct Pelvis Wo Contrast  Result Date: 12/03/2017 CLINICAL DATA:  Hip pain, chronic EXAM: CT PELVIS WITHOUT CONTRAST TECHNIQUE: Multidetector CT imaging of the pelvis was performed following the standard protocol without intravenous contrast. COMPARISON:  05/13/2016 FINDINGS: Urinary Tract:  No abnormality visualized. Bowel: Unremarkable visualized pelvic bowel loops. Status post appendectomy Vascular/Lymphatic: No pathologically enlarged lymph nodes. No significant vascular abnormality seen. Reproductive:  No mass or other significant abnormality Other:  None. Musculoskeletal: No acute abnormality. Probable bone island in the right iliac bone. Bilateral SI joint sclerosis. IMPRESSION: 1. No acute abnormality is seen. Negative for fracture or soft tissue inflammatory change around the bilateral hips by CT. 2. Bilateral SI joint sclerosis, can be seen in the setting of sacroiliitis/seronegative spondyloarthropathy. Electronically Signed   By: Jasmine PangKim  Fujinaga M.D.   On: 12/03/2017 18:12    Procedures Procedures (including critical care time)  Medications Ordered in ED Medications  oxyCODONE-acetaminophen (PERCOCET/ROXICET) 5-325 MG per tablet 1 tablet (1 tablet Oral Given 12/03/17 1629)  ketorolac (TORADOL) 30 MG/ML injection 15 mg (15 mg Intramuscular Given 12/03/17 1649)     Initial Impression / Assessment and Plan / ED Course  I have reviewed the triage vital signs  and the nursing notes.  Pertinent labs & imaging results that were available during my care of the patient were reviewed by me and considered in my medical decision making (see chart for details).     Patient presents to the ED with complaints of low back pain, bilateral hip pains and paresthesias.  The paresthesias have since resolved.  Complains of pain with certain positions, ambulation and palpation.  History of same.  Patient denies any red flag symptoms concerning for cauda equina.  And she does have pain with range of motion of the bilateral hips but no erythema or warmth over the joints of the lower extremity though be concerning for septic arthritis.  No lower  extremity edema that would be concerning for DVT.  Concern for radiculopathy likely from herniated disc.  CT scan was obtained of the lumbar region in the pelvis.  Does show some herniated disc any significant spinal stenosis.  She does have signs of Bilateral SI joint sclerosis.  No acute abnormalities were noted.  The patient will likely need follow-up with orthopedics or neurosurgery for possible MRI in the outpatient setting.  Do not feel that patient needs emergent MRI as she is neurovascularly intact and has no focal neuro deficits on exam.  Will treat patient with Medrol Dosepak, anti-inflammatories and muscle relaxers.  Encouraged her to follow-up with her PCP and especially.  Pt is hemodynamically stable, in NAD, & able to ambulate in the ED. Evaluation does not show pathology that would require ongoing emergent intervention or inpatient treatment. I explained the diagnosis to the patient. Pain has been managed & has no complaints prior to dc. Pt is comfortable with above plan and is stable for discharge at this time. All questions were answered prior to disposition. Strict return precautions for f/u to the ED were discussed. Encouraged follow up with PCP.  Pt was seen and eval by my attending Dr. Rush Landmarkegeler who is agreeable with the  above plan.   Final Clinical Impressions(s) / ED Diagnoses   Final diagnoses:  Acute bilateral low back pain with bilateral sciatica  Pain of both hip joints    ED Discharge Orders    None       Wallace KellerLeaphart, Christie Copley T, PA-C 12/03/17 1850    Tegeler, Canary Brimhristopher J, MD 12/04/17 [phone]

## 2017-12-04 ENCOUNTER — Telehealth: Payer: Self-pay | Admitting: Adult Health

## 2017-12-04 LAB — COMPREHENSIVE METABOLIC PANEL
ALBUMIN: 4.1 g/dL (ref 3.5–5.5)
ALT: 20 IU/L (ref 0–32)
AST: 21 IU/L (ref 0–40)
Albumin/Globulin Ratio: 1.8 (ref 1.2–2.2)
Alkaline Phosphatase: 67 IU/L (ref 39–117)
BUN / CREAT RATIO: 10 (ref 9–23)
BUN: 7 mg/dL (ref 6–20)
Bilirubin Total: 0.5 mg/dL (ref 0.0–1.2)
CALCIUM: 9.5 mg/dL (ref 8.7–10.2)
CO2: 24 mmol/L (ref 20–29)
CREATININE: 0.71 mg/dL (ref 0.57–1.00)
Chloride: 103 mmol/L (ref 96–106)
GFR calc Af Amer: 129 mL/min/{1.73_m2} (ref >59)
GFR, EST NON AFRICAN AMERICAN: 112 mL/min/{1.73_m2} (ref >59)
GLOBULIN, TOTAL: 2.3 g/dL (ref 1.5–4.5)
GLUCOSE: 94 mg/dL (ref 65–99)
Potassium: 4.5 mmol/L (ref 3.5–5.2)
SODIUM: 139 mmol/L (ref 134–144)
Total Protein: 6.4 g/dL (ref 6.0–8.5)

## 2017-12-04 LAB — CBC WITH DIFFERENTIAL/PLATELET
BASOS: 1 %
Basophils Absolute: 0 10*3/uL (ref 0.0–0.2)
EOS (ABSOLUTE): 0.2 10*3/uL (ref 0.0–0.4)
EOS: 4 %
HEMATOCRIT: 37.1 % (ref 34.0–46.6)
HEMOGLOBIN: 12.9 g/dL (ref 11.1–15.9)
IMMATURE GRANULOCYTES: 0 %
Immature Grans (Abs): 0 10*3/uL (ref 0.0–0.1)
LYMPHS ABS: 2.1 10*3/uL (ref 0.7–3.1)
Lymphs: 38 %
MCH: 31.2 pg (ref 26.6–33.0)
MCHC: 34.8 g/dL (ref 31.5–35.7)
MCV: 90 fL (ref 79–97)
MONOCYTES: 5 %
MONOS ABS: 0.3 10*3/uL (ref 0.1–0.9)
NEUTROS PCT: 52 %
Neutrophils Absolute: 3 10*3/uL (ref 1.4–7.0)
Platelets: 290 10*3/uL (ref 150–379)
RBC: 4.14 x10E6/uL (ref 3.77–5.28)
RDW: 13.4 % (ref 12.3–15.4)
WBC: 5.7 10*3/uL (ref 3.4–10.8)

## 2017-12-04 LAB — LIPID PANEL
CHOLESTEROL TOTAL: 195 mg/dL (ref 100–199)
Chol/HDL Ratio: 4.3 ratio (ref 0.0–4.4)
HDL: 45 mg/dL (ref >39)
LDL Calculated: 98 mg/dL (ref 0–99)
TRIGLYCERIDES: 262 mg/dL — AB (ref 0–149)
VLDL Cholesterol Cal: 52 mg/dL — ABNORMAL HIGH (ref 5–40)

## 2017-12-04 LAB — VITAMIN D 25 HYDROXY (VIT D DEFICIENCY, FRACTURES): VIT D 25 HYDROXY: 19.8 ng/mL — AB (ref 30.0–100.0)

## 2017-12-04 LAB — HEMOGLOBIN A1C
Est. average glucose Bld gHb Est-mCnc: 100 mg/dL
Hgb A1c MFr Bld: 5.1 % (ref 4.8–5.6)

## 2017-12-04 LAB — TSH: TSH: 2.33 u[IU]/mL (ref 0.450–4.500)

## 2017-12-04 NOTE — Telephone Encounter (Signed)
Pt called states he was told by Ed doctor that he has several herniated disc with inflammation & was told to have his PCP order an MRI for him--forwarding message to provider or medical assistant to f/u with patient. --glh

## 2017-12-04 NOTE — Telephone Encounter (Signed)
Pt states that she saw [facility] this morning and they scheduled her for an MRI on 12/09/17.  Pt was inquiring as to the possibility of our office getting her in sooner for the MRI.  Advised pt that this is unlikely and she should keep appt on 12/09/17 and call ortho if any worsening or new symptoms.  Pt expressed understanding and is agreeable.  Tiajuana Amass. Nelson, CMA

## 2017-12-04 NOTE — Telephone Encounter (Signed)
LVM for pt to call to discuss.  T. Nelson, CMA  

## 2017-12-04 NOTE — Telephone Encounter (Signed)
Afternoon Tonya, Please have her make an OV to discuss her recent ED visit, current sx's and plan, re: further imaging or referral to specialist. Thanks! Orpha BurKaty

## 2017-12-04 NOTE — Telephone Encounter (Signed)
Please advise if you would like to proceed with MRI or if you prefer the patient follow up with you first.  Tiajuana Amass. Yara Tomkinson, CMA

## 2017-12-16 DIAGNOSIS — M5136 Other intervertebral disc degeneration, lumbar region: Secondary | ICD-10-CM | POA: Insufficient documentation

## 2017-12-16 DIAGNOSIS — M545 Low back pain, unspecified: Secondary | ICD-10-CM | POA: Insufficient documentation

## 2017-12-22 NOTE — Progress Notes (Signed)
Subjective:    Patient ID: Carrie Reed, female    DOB: [birth year], 32 y.o.   MRN: [id number]  HPI:08/12/17 OV:  [name] presents to establish as a new pt.  She is a very pleasant 32 year old female.  PMH: Migraine HAs-localized over L parietal area, sharp pain rated 10/10.  Last occurrence was >2 months ago.  Migraine prophlyaxis with Velnafaxine 150mg .  She is followed annually by Neurology.  She has has chronic RUQ pain that has been intermittent since 2006/2007.  She had EGD 12/2015: negative.  That was her last contact with GI specialist.  She was seen 08/07/17 for RUQ pain- full workup essentially negative, recommended to have HIDA scan.  RUQ pain will radiate to mid-sternum and often to R middle back. Pain is described as dull ache and currently rated 1/10.  She has identified that soda and heavy meals will worsen the sx's, water will decrease pain/discomfort.  She denies family hx of colon ca.  She denies current GERD sx's.  She has stopped consuming caffeine and eliminated all foods that will trigger RUQ.  She drinks >gallon/water day and eats only one meal in the late afternoon/evenig day.  She smoke 1/2 pack /day and rarely drinks EOTH. She also reports intermittent N/V that occurs several times/month. 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  12/24/17 OV: [name] is here for CPE. Followed by [facility] for lumbar back pain and bil hip pain (ED visit for sx's 12/03/17)- treated with Meloxicam and twice weekly Accupuncture.  She was unable to ambulate without sig pain prior to these tx's, now can walk without pain-GREAT.  She will f/u with [facility] next week. She continues to use tobacco, declined smoking cessation today. Last contact with GI/Dr. Loreta Ave was Fall 2018, re: "fatty liver and gallbladder disease".  She denies current abdominal complaints today and f/u with GI now PRN. Reviewed 12/18 labs at length= slightly elevated TGs and VLDL- advised to reduce CHO/saturated  fat intake and move as often as possible.  Healthcare Maintenance: PAP- declined today Mammogram-not indicated Colonoscopy-not indicated   Patient Care Team    Relationship Specialty Notifications Start End  Shene Maxfield, Jinny Blossom, NP PCP - General Family Medicine  08/12/17     Patient Active Problem List   Diagnosis Date Noted  . Degenerative disc disease, lumbar 12/24/2017  . Bulging of cervical intervertebral disc 12/24/2017  . Acute maxillary sinusitis 11/11/2017  . Cough 11/11/2017  . Chronic RUQ pain 08/12/2017  . Healthcare maintenance 08/12/2017  . Intractable chronic migraine without aura and without status migrainosus 04/27/2016  . Migraine 08/02/2015  . Pulsatile tinnitus of left ear 08/02/2015  . Blurry vision 08/02/2015  . Worsening headaches 08/02/2015     Past Medical History:  Diagnosis Date  . Bulging of cervical intervertebral disc   . Degenerative disc disease, lumbar   . Migraine      Past Surgical History:  Procedure Laterality Date  . APPENDECTOMY    . TONSILLECTOMY AND ADENOIDECTOMY     x2  . TUBAL LIGATION    . TYMPANOSTOMY TUBE PLACEMENT    . UTERINE TEAR     d/t late stage abortion     Family History  Problem Relation Age of Onset  . Cirrhosis Mother   . Kidney disease Mother   . Diabetes Mother   . Stroke Mother   . Clotting disorder Mother   . Heart failure Father   . Heart disease  Father   . Heart attack Father   . Diabetes Father   . Hyperlipidemia Father   . Cancer Sister        cervical cancer  . Hypertension Brother   . Clotting disorder Brother   . High blood pressure Brother   . Cervical cancer Sister   . Suicidality Maternal Uncle   . Suicidality Paternal Aunt   . Diabetes Paternal Grandmother   . Cancer Paternal Grandfather        throat  . Suicidality Cousin   . Migraines Neg Hx      Social History   Substance and Sexual Activity  Drug Use No     Social History   Substance and Sexual Activity  Alcohol  Use Yes  . Alcohol/week: 0.0 oz   Comment: social     Social History   Tobacco Use  Smoking Status Current Every Day Smoker  . Packs/day: 0.50  . Years: 12.00  . Pack years: 6.00  . Types: Cigarettes  Smokeless Tobacco Never Used     Outpatient Encounter Medications as of 12/24/2017  Medication Sig  . naproxen sodium (ALEVE) 220 MG tablet Take 440 mg by mouth daily as needed (MIGRAINE).  Marland Kitchen ondansetron (ZOFRAN ODT) 4 MG disintegrating tablet Take 1 tablet (4 mg total) by mouth every 8 (eight) hours as needed for nausea or vomiting.  . traMADol (ULTRAM) 50 MG tablet Take 1 tablet by mouth Once PRN.  . venlafaxine XR (EFFEXOR-XR) 150 MG 24 hr capsule TAKE 1 CAPSULE BY MOUTH DAILY WITH BREAKFAST.  . meloxicam (MOBIC) 15 MG tablet Take 15 mg by mouth daily.  . [DISCONTINUED] benzonatate (TESSALON) 200 MG capsule Take 1 capsule (200 mg total) by mouth 2 (two) times daily as needed for cough.  . [DISCONTINUED] cefdinir (OMNICEF) 300 MG capsule Take 1 capsule (300 mg total) by mouth 2 (two) times daily.  . [DISCONTINUED] cyclobenzaprine (FLEXERIL) 10 MG tablet Take 1 tablet (10 mg total) by mouth 2 (two) times daily as needed.  . [DISCONTINUED] fluticasone (FLONASE) 50 MCG/ACT nasal spray Place 2 sprays into both nostrils daily.  . [DISCONTINUED] methylPREDNISolone (MEDROL DOSEPAK) 4 MG TBPK tablet Take as directed   No facility-administered encounter medications on file as of 12/24/2017.     Allergies: Propranolol  Body mass index is 36.11 kg/m.  Blood pressure 117/78, pulse (!) 107, height 5' 6.5" (1.689 m), weight 227 lb 1.6 oz (103 kg), last menstrual period 11/19/2017, SpO2 98 %.     Review of Systems  Constitutional: Positive for fatigue. Negative for activity change, appetite change, chills, diaphoresis, fever and unexpected weight change.  Eyes: Negative for visual disturbance.  Respiratory: Negative for cough, chest tightness, shortness of breath, wheezing and stridor.    Cardiovascular: Negative for chest pain, palpitations and leg swelling.  Gastrointestinal: Positive for abdominal pain, constipation, diarrhea, nausea and vomiting. Negative for abdominal distention and blood in stool.  Endocrine: Negative for cold intolerance, heat intolerance, polydipsia, polyphagia and polyuria.  Genitourinary: Negative for difficulty urinating, flank pain and hematuria.  Musculoskeletal: Positive for arthralgias, back pain, myalgias, neck pain and neck stiffness. Negative for gait problem and joint swelling.  Skin: Negative for color change, pallor, rash and wound.  Neurological: Positive for headaches. Negative for dizziness, tremors and weakness.  Hematological: Bruises/bleeds easily.  Psychiatric/Behavioral: Positive for sleep disturbance. Negative for confusion, decreased concentration, dysphoric mood, hallucinations, self-injury and suicidal ideas. The patient is not nervous/anxious and is not hyperactive.  Objective:   Physical Exam  Constitutional: She is oriented to person, place, and time. She appears well-developed and well-nourished. No distress.  HENT:  Head: Normocephalic and atraumatic.  Right Ear: Hearing, tympanic membrane, external ear and ear canal normal. Tympanic membrane is not perforated and not bulging. No decreased hearing is noted.  Left Ear: Hearing, tympanic membrane, external ear and ear canal normal. Tympanic membrane is not perforated and not bulging. No decreased hearing is noted.  Nose: Nose normal. No mucosal edema or rhinorrhea. Right sinus exhibits no maxillary sinus tenderness and no frontal sinus tenderness. Left sinus exhibits no maxillary sinus tenderness and no frontal sinus tenderness.  Mouth/Throat: Uvula is midline, oropharynx is clear and moist and mucous membranes are normal.  Eyes: Conjunctivae are normal. Pupils are equal, round, and reactive to light.  Neck: Normal range of motion. Neck supple.  Cardiovascular: Normal  rate, regular rhythm, normal heart sounds and intact distal pulses.  No murmur heard. Pulmonary/Chest: Effort normal and breath sounds normal. No respiratory distress. She has no wheezes. She exhibits no tenderness.  Abdominal: Soft. Bowel sounds are normal. She exhibits no distension and no mass. There is no tenderness. There is no rigidity, no rebound, no guarding and no CVA tenderness.  Musculoskeletal: She exhibits tenderness.       Right hip: She exhibits tenderness.       Left hip: She exhibits tenderness.       Right knee: Normal.       Left knee: Normal.       Cervical back: She exhibits tenderness.       Lumbar back: She exhibits tenderness and pain. She exhibits no spasm.  Lymphadenopathy:    She has no cervical adenopathy.  Neurological: She is alert and oriented to person, place, and time. Coordination normal.  Skin: Skin is warm and dry. No rash noted. She is not diaphoretic. No erythema. No pallor.  Psychiatric: She has a normal mood and affect. Her behavior is normal. Judgment and thought content normal.  Nursing note and vitals reviewed.         Assessment & Plan:   1. Healthcare maintenance   2. Chronic RUQ pain   3. Degenerative disc disease, lumbar   4. Bulging of cervical intervertebral disc     Healthcare maintenance Increase water intake, strive for at least 110 ounces/day.   Follow Heart Healthy diet Increase regular exercise.  Recommend at least 30 minutes daily, 5 days per week of walking, jogging, biking, swimming, YouTube/Pinterest workout videos. Reduce-stop tobacco use-YOU CAN DO IT! Overall labs look good- reduce carbohydrate/saturated fat. Annual follow-up: physical with fasting labs. Sooner if needed.  Degenerative disc disease, lumbar Followed by [facility]- currently being treated with NSAIDs and accupuncture- excellent reduction in pain.  Bulging of cervical intervertebral disc Followed by [facility]- currently being treated with NSAIDs and  accupuncture-    FOLLOW-UP:  Return in about 1 year (around 12/24/2018) for CPE, Fasting Labs.

## 2017-12-24 ENCOUNTER — Ambulatory Visit (INDEPENDENT_AMBULATORY_CARE_PROVIDER_SITE_OTHER): Payer: Managed Care, Other (non HMO) | Admitting: Adult Health

## 2017-12-24 ENCOUNTER — Encounter: Payer: Self-pay | Admitting: Adult Health

## 2017-12-24 VITALS — BP 117/78 | HR 107 | Ht 66.5 in | Wt 227.1 lb

## 2017-12-24 DIAGNOSIS — Z0000 Encounter for general adult medical examination without abnormal findings: Secondary | ICD-10-CM

## 2017-12-24 DIAGNOSIS — M5020 Other cervical disc displacement, unspecified cervical region: Secondary | ICD-10-CM

## 2017-12-24 DIAGNOSIS — R1011 Right upper quadrant pain: Secondary | ICD-10-CM

## 2017-12-24 DIAGNOSIS — G8929 Other chronic pain: Secondary | ICD-10-CM

## 2017-12-24 DIAGNOSIS — M5136 Other intervertebral disc degeneration, lumbar region: Secondary | ICD-10-CM | POA: Diagnosis not present

## 2017-12-24 DIAGNOSIS — M5030 Other cervical disc degeneration, unspecified cervical region: Secondary | ICD-10-CM

## 2017-12-24 NOTE — Assessment & Plan Note (Signed)
Followed by GI/Dr. Loreta AveMann

## 2017-12-24 NOTE — Assessment & Plan Note (Addendum)
[facility] currently being treated with NSAIDs and accupuncture-excellent reduction in pain. Has f/u next week.

## 2017-12-24 NOTE — Assessment & Plan Note (Signed)
Increase water intake, strive for at least 110 ounces/day.   Follow Heart Healthy diet Increase regular exercise.  Recommend at least 30 minutes daily, 5 days per week of walking, jogging, biking, swimming, YouTube/Pinterest workout videos. Reduce-stop tobacco use-YOU CAN DO IT! Overall labs look good- reduce carbohydrate/saturated fat. Annual follow-up: physical with fasting labs. Sooner if needed.

## 2017-12-24 NOTE — Patient Instructions (Signed)
Heart-Healthy Eating Plan Many factors influence your heart health, including eating and exercise habits. Heart (coronary) risk increases with abnormal blood fat (lipid) levels. Heart-healthy meal planning includes limiting unhealthy fats, increasing healthy fats, and making other small dietary changes. This includes maintaining a healthy body weight to help keep lipid levels within a normal range. What is my plan? Your health care provider recommends that you:  Get no more than _25___% of the total calories in your daily diet from fat.  Limit your intake of saturated fat to less than __5____% of your total calories each day.  Limit the amount of cholesterol in your diet to less than __300__ mg per day.  What types of fat should I choose?  Choose healthy fats more often. Choose monounsaturated and polyunsaturated fats, such as olive oil and canola oil, flaxseeds, walnuts, almonds, and seeds.  Eat more omega-3 fats. Good choices include salmon, mackerel, sardines, tuna, flaxseed oil, and ground flaxseeds. Aim to eat fish at least two times each week.  Limit saturated fats. Saturated fats are primarily found in animal products, such as meats, butter, and cream. Plant sources of saturated fats include palm oil, palm kernel oil, and coconut oil.  Avoid foods with partially hydrogenated oils in them. These contain trans fats. Examples of foods that contain trans fats are stick margarine, some tub margarines, cookies, crackers, and other baked goods. What general guidelines do I need to follow?  Check food labels carefully to identify foods with trans fats or high amounts of saturated fat.  Fill one half of your plate with vegetables and green salads. Eat 4-5 servings of vegetables per day. A serving of vegetables equals 1 cup of raw leafy vegetables,  cup of raw or cooked cut-up vegetables, or  cup of vegetable juice.  Fill one fourth of your plate with whole grains. Look for the word "whole"  as the first word in the ingredient list.  Fill one fourth of your plate with lean protein foods.  Eat 4-5 servings of fruit per day. A serving of fruit equals one medium whole fruit,  cup of dried fruit,  cup of fresh, frozen, or canned fruit, or  cup of 100% fruit juice.  Eat more foods that contain soluble fiber. Examples of foods that contain this type of fiber are apples, broccoli, carrots, beans, peas, and barley. Aim to get 20-30 g of fiber per day.  Eat more home-cooked food and less restaurant, buffet, and fast food.  Limit or avoid alcohol.  Limit foods that are high in starch and sugar.  Avoid fried foods.  Cook foods by using methods other than frying. Baking, boiling, grilling, and broiling are all great options. Other fat-reducing suggestions include: ? Removing the skin from poultry. ? Removing all visible fats from meats. ? Skimming the fat off of stews, soups, and gravies before serving them. ? Steaming vegetables in water or broth.  Lose weight if you are overweight. Losing just 5-10% of your initial body weight can help your overall health and prevent diseases such as diabetes and heart disease.  Increase your consumption of nuts, legumes, and seeds to 4-5 servings per week. One serving of dried beans or legumes equals  cup after being cooked, one serving of nuts equals 1 ounces, and one serving of seeds equals  ounce or 1 tablespoon.  You may need to monitor your salt (sodium) intake, especially if you have high blood pressure. Talk with your health care provider or dietitian to get  more information about reducing sodium. What foods can I eat? Grains  Breads, including [place], white, pita, wheat, raisin, rye, oatmeal, and [place]. Tortillas that are neither fried nor made with lard or trans fat. Low-fat rolls, including hotdog and hamburger buns and English muffins. Biscuits. Muffins. Waffles. Pancakes. Light popcorn. Whole-grain cereals. Flatbread. Melba  toast. Pretzels. Breadsticks. Rusks. Low-fat snacks and crackers, including oyster, saltine, matzo, graham, animal, and rye. Rice and pasta, including brown rice and those that are made with whole wheat. Vegetables All vegetables. Fruits All fruits, but limit coconut. Meats and Other Protein Sources Lean, well-trimmed beef, veal, pork, and lamb. Chicken and [place] without skin. All fish and shellfish. Wild duck, rabbit, pheasant, and venison. Egg whites or low-cholesterol egg substitutes. Dried beans, peas, lentils, and tofu.Seeds and most nuts. Dairy Low-fat or nonfat cheeses, including ricotta, string, and mozzarella. Skim or 1% milk that is liquid, powdered, or evaporated. Buttermilk that is made with low-fat milk. Nonfat or low-fat yogurt. Beverages Mineral water. Diet carbonated beverages. Sweets and Desserts Sherbets and fruit ices. Honey, jam, marmalade, jelly, and syrups. Meringues and gelatins. Pure sugar candy, such as hard candy, jelly beans, gumdrops, mints, marshmallows, and small amounts of dark chocolate. [employer] Eat all sweets and desserts in moderation. Fats and Oils Nonhydrogenated (trans-free) margarines. Vegetable oils, including soybean, sesame, sunflower, olive, peanut, safflower, corn, canola, and cottonseed. Salad dressings or mayonnaise that are made with a vegetable oil. Limit added fats and oils that you use for cooking, baking, salads, and as spreads. Other Cocoa powder. Coffee and tea. All seasonings and condiments. The items listed above may not be a complete list of recommended foods or beverages. Contact your dietitian for more options. What foods are not recommended? Grains Breads that are made with saturated or trans fats, oils, or whole milk. Croissants. Butter rolls. Cheese breads. Sweet rolls. Donuts. Buttered popcorn. Chow mein noodles. High-fat crackers, such as cheese or butter crackers. Meats and Other Protein Sources Fatty meats, such as  hotdogs, short ribs, sausage, spareribs, bacon, ribeye roast or steak, and mutton. High-fat deli meats, such as salami and bologna. Caviar. Domestic duck and goose. Organ meats, such as kidney, liver, sweetbreads, brains, gizzard, chitterlings, and heart. Dairy Cream, sour cream, cream cheese, and creamed cottage cheese. Whole milk cheeses, including blue (bleu), Monterey Jack, [place], [place], American, [place], Swiss, [place], [place], and [place]. Whole or 2% milk that is liquid, evaporated, or condensed. Whole buttermilk. Cream sauce or high-fat cheese sauce. Yogurt that is made from whole milk. Beverages Regular sodas and drinks with added sugar. Sweets and Desserts Frosting. Pudding. Cookies. Cakes other than angel food cake. Candy that has milk chocolate or white chocolate, hydrogenated fat, butter, coconut, or unknown ingredients. Buttered syrups. Full-fat ice cream or ice cream drinks. Fats and Oils Gravy that has suet, meat fat, or shortening. Cocoa butter, hydrogenated oils, palm oil, coconut oil, palm kernel oil. These can often be found in baked products, candy, fried foods, nondairy creamers, and whipped toppings. Solid fats and shortenings, including bacon fat, salt pork, lard, and butter. Nondairy cream substitutes, such as coffee creamers and sour cream substitutes. Salad dressings that are made of unknown oils, cheese, or sour cream. The items listed above may not be a complete list of foods and beverages to avoid. Contact your dietitian for more information. This information is not intended to replace advice given to you by your health care provider. Make sure you discuss any questions you have with your health care  provider. Document Released: 09/02/2008 Document Revised: 06/13/2016 Document Reviewed: 05/18/2014 Elsevier Interactive Patient Education  2018 [employer]  Increase water intake, strive for at least 110 ounces/day.   Follow Heart Healthy diet Increase regular  exercise.  Recommend at least 30 minutes daily, 5 days per week of walking, jogging, biking, swimming, YouTube/Pinterest workout videos. Reduce-stop tobacco use-YOU CAN DO IT! Overall labs look good- reduce carbohydrate/saturated fat. Annual follow-up: physical with fasting labs. Sooner if needed. NICE TO SEE YOU!

## 2017-12-24 NOTE — Assessment & Plan Note (Addendum)
Followed by [facility]- currently being treated with NSAIDs and accupuncture-excellent reduction in pain. Has f/u next week. 

## 2018-07-30 ENCOUNTER — Emergency Department (HOSPITAL_COMMUNITY): Payer: Managed Care, Other (non HMO)

## 2018-07-30 ENCOUNTER — Emergency Department (HOSPITAL_COMMUNITY)
Admission: EM | Admit: 2018-07-30 | Discharge: 2018-07-30 | Disposition: A | Discharge status: Home / Self Care | Payer: Managed Care, Other (non HMO) | Attending: Emergency Medicine | Admitting: Emergency Medicine

## 2018-07-30 ENCOUNTER — Encounter (HOSPITAL_COMMUNITY): Payer: Self-pay | Admitting: Emergency Medicine

## 2018-07-30 ENCOUNTER — Other Ambulatory Visit: Payer: Self-pay

## 2018-07-30 DIAGNOSIS — S330XXA Traumatic rupture of lumbar intervertebral disc, initial encounter: Secondary | ICD-10-CM | POA: Diagnosis not present

## 2018-07-30 DIAGNOSIS — M5417 Radiculopathy, lumbosacral region: Secondary | ICD-10-CM

## 2018-07-30 DIAGNOSIS — Y9301 Activity, walking, marching and hiking: Secondary | ICD-10-CM | POA: Diagnosis not present

## 2018-07-30 DIAGNOSIS — Y999 Unspecified external cause status: Secondary | ICD-10-CM | POA: Insufficient documentation

## 2018-07-30 DIAGNOSIS — S3992XA Unspecified injury of lower back, initial encounter: Secondary | ICD-10-CM | POA: Diagnosis present

## 2018-07-30 DIAGNOSIS — F17210 Nicotine dependence, cigarettes, uncomplicated: Secondary | ICD-10-CM | POA: Diagnosis not present

## 2018-07-30 DIAGNOSIS — M5416 Radiculopathy, lumbar region: Secondary | ICD-10-CM | POA: Diagnosis not present

## 2018-07-30 DIAGNOSIS — Y929 Unspecified place or not applicable: Secondary | ICD-10-CM | POA: Diagnosis not present

## 2018-07-30 DIAGNOSIS — X500XXA Overexertion from strenuous movement or load, initial encounter: Secondary | ICD-10-CM | POA: Diagnosis not present

## 2018-07-30 LAB — COMPREHENSIVE METABOLIC PANEL
ALBUMIN: 3.7 g/dL (ref 3.5–5.0)
ALT: 24 U/L (ref 0–44)
AST: 24 U/L (ref 15–41)
Alkaline Phosphatase: 60 U/L (ref 38–126)
Anion gap: 8 (ref 5–15)
BILIRUBIN TOTAL: 0.7 mg/dL (ref 0.3–1.2)
BUN: 12 mg/dL (ref 6–20)
CHLORIDE: 106 mmol/L (ref 98–111)
CO2: 24 mmol/L (ref 22–32)
Calcium: 9.1 mg/dL (ref 8.9–10.3)
Creatinine, Ser: 0.81 mg/dL (ref 0.44–1.00)
GFR calc Af Amer: >60 mL/min (ref >60)
GFR calc non Af Amer: >60 mL/min (ref >60)
GLUCOSE: 95 mg/dL (ref 70–99)
POTASSIUM: 3.9 mmol/L (ref 3.5–5.1)
SODIUM: 138 mmol/L (ref 135–145)
Total Protein: 6.4 g/dL — ABNORMAL LOW (ref 6.5–8.1)

## 2018-07-30 LAB — CBC WITH DIFFERENTIAL/PLATELET
Abs Immature Granulocytes: 0 10*3/uL (ref 0.0–0.1)
Basophils Absolute: 0 10*3/uL (ref 0.0–0.1)
Basophils Relative: 1 %
EOS ABS: 0.2 10*3/uL (ref 0.0–0.7)
Eosinophils Relative: 3 %
HEMATOCRIT: 37.5 % (ref 36.0–46.0)
Hemoglobin: 12.5 g/dL (ref 12.0–15.0)
IMMATURE GRANULOCYTES: 0 %
LYMPHS ABS: 2.3 10*3/uL (ref 0.7–4.0)
Lymphocytes Relative: 37 %
MCH: 31.7 pg (ref 26.0–34.0)
MCHC: 33.3 g/dL (ref 30.0–36.0)
MCV: 95.2 fL (ref 78.0–100.0)
Monocytes Absolute: 0.3 10*3/uL (ref 0.1–1.0)
Monocytes Relative: 5 %
Neutro Abs: 3.4 10*3/uL (ref 1.7–7.7)
Neutrophils Relative %: 54 %
PLATELETS: 249 10*3/uL (ref 150–400)
RBC: 3.94 MIL/uL (ref 3.87–5.11)
RDW: 12.4 % (ref 11.5–15.5)
WBC: 6.2 10*3/uL (ref 4.0–10.5)

## 2018-07-30 LAB — I-STAT BETA HCG BLOOD, ED (MC, WL, AP ONLY): I-stat hCG, quantitative: <5 m[IU]/mL (ref <5)

## 2018-07-30 MED ORDER — IBUPROFEN 800 MG PO TABS: 800.0000 mg | ORAL_TABLET | Freq: Three times a day (TID) | ORAL | 0 refills | Status: DC

## 2018-07-30 MED ORDER — METHYLPREDNISOLONE 4 MG PO TBPK: ORAL_TABLET | ORAL | 0 refills | Status: DC

## 2018-07-30 MED ORDER — METHYLPREDNISOLONE SODIUM SUCC 125 MG IJ SOLR
125.0000 mg | Freq: Once | INTRAMUSCULAR | Status: AC
  Administered 2018-07-30: 125 mg via INTRAVENOUS
  Filled 2018-07-30: qty 2

## 2018-07-30 MED ORDER — HYDROMORPHONE HCL 1 MG/ML IJ SOLN
1.0000 mg | Freq: Once | INTRAMUSCULAR | Status: AC
  Administered 2018-07-30: 1 mg via INTRAVENOUS
  Filled 2018-07-30: qty 1

## 2018-07-30 MED ORDER — OXYCODONE-ACETAMINOPHEN 5-325 MG PO TABS: 1.0000 | ORAL_TABLET | Freq: Four times a day (QID) | ORAL | 0 refills | Status: DC | PRN

## 2018-07-30 MED ORDER — DIAZEPAM 5 MG PO TABS
5.0000 mg | ORAL_TABLET | Freq: Once | ORAL | Status: AC
  Administered 2018-07-30: 5 mg via ORAL
  Filled 2018-07-30: qty 1

## 2018-07-30 MED ORDER — KETOROLAC TROMETHAMINE 15 MG/ML IJ SOLN
15.0000 mg | Freq: Once | INTRAMUSCULAR | Status: AC
  Administered 2018-07-30: 15 mg via INTRAVENOUS
  Filled 2018-07-30: qty 1

## 2018-07-30 MED ORDER — DICLOFENAC SODIUM 1 % TD GEL: 4.0000 g | Freq: Four times a day (QID) | TRANSDERMAL | 0 refills | Status: DC

## 2018-07-30 NOTE — ED Triage Notes (Signed)
Pt has hx of degenerative disc disease. Pt was lying in bed when her left arm started tingling. Pt then got up to go to the bathroom when she had severe lower rt back pain with difficulty walking and issues moving her legs. Pain worsens with position changes to side or supine.  Denies injuries. Denies urinary sx or any other neuro sx.

## 2018-07-30 NOTE — ED Provider Notes (Signed)
[facility]ENC[facility] Provider Note   CSN: [id number] Arrival date & time: 07/30/18  0404     History   Chief Complaint Chief Complaint  Patient presents with  . Back Pain    HPI Arvin CollardJamie Reed is a 32 y.o. female.  HPI  32 year old female comes in with chief complaint of back pain.  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  Patient reports that in the middle the night when she was ambulating, she suddenly had severe pain in her back with associated weakness in her legs.  Patient also has been having subjective paresthesia to her extremities since then.  Review of system is negative for any urinary incontinence, urinary retention, saddle anesthesia or bowel incontinence.  Patient has had similar pain in the past, however she typically does not get the numbness or weakness in her extremities.  Patient had an MRI done in January of this year, which had shown significant disc disease in her lumbar spine.  Past Medical History:  Diagnosis Date  . Bulging of cervical intervertebral disc   . Degenerative disc disease, lumbar   . Migraine     Patient Active Problem List   Diagnosis Date Noted  . Degenerative disc disease, lumbar 12/24/2017  . Bulging of cervical intervertebral disc 12/24/2017  . Acute maxillary sinusitis 11/11/2017  . Cough 11/11/2017  . Chronic RUQ pain 08/12/2017  . Healthcare maintenance 08/12/2017  . Intractable chronic migraine without aura and without status migrainosus 04/27/2016  . Migraine 08/02/2015  . Pulsatile tinnitus of left ear 08/02/2015  . Blurry vision 08/02/2015  . Worsening headaches 08/02/2015    Past Surgical History:  Procedure Laterality Date  . APPENDECTOMY    . TONSILLECTOMY AND ADENOIDECTOMY     x2  . TUBAL LIGATION    . TYMPANOSTOMY TUBE PLACEMENT    . UTERINE TEAR     d/t late stage abortion     OB History   None      Home  Medications    Prior to Admission medications   Medication Sig Start Date End Date Taking? Authorizing Provider  tiZANidine (ZANAFLEX) 2 MG tablet Take 2 mg by mouth 3 (three) times daily as needed for muscle spasms.    Yes [provider]  ondansetron (ZOFRAN ODT) 4 MG disintegrating tablet Take 1 tablet (4 mg total) by mouth every 8 (eight) hours as needed for nausea or vomiting. Patient not taking: Reported on 07/30/2018 08/06/17   Street, BunkerMercedes, PA-C  venlafaxine XR (EFFEXOR-XR) 150 MG 24 hr capsule TAKE 1 CAPSULE BY MOUTH DAILY WITH BREAKFAST. Patient not taking: Reported on 07/30/2018 12/25/16   Anson FretAhern, Antonia B, MD  nortriptyline (PAMELOR) 10 MG capsule Take 2 capsules (20 mg total) by mouth at bedtime. Patient not taking: Reported on 11/26/2015 08/07/15 02/02/16  Anson FretAhern, Antonia B, MD    Family History Family History  Problem Relation Age of Onset  . Cirrhosis Mother   . Kidney disease Mother   . Diabetes Mother   . Stroke Mother   . Clotting disorder Mother   . Heart failure Father   . Heart disease Father   . Heart attack Father   . Diabetes Father   . Hyperlipidemia Father   . Cancer Sister        cervical cancer  . Hypertension Brother   . Clotting disorder Brother   . High blood pressure Brother   . Cervical cancer Sister   .  Suicidality Maternal Uncle   . Suicidality Paternal Aunt   . Diabetes Paternal Grandmother   . Cancer Paternal Grandfather        throat  . Suicidality Cousin   . Migraines Neg Hx     Social History Social History   Tobacco Use  . Smoking status: Current Every Day Smoker    Packs/day: 0.50    Years: 12.00    Pack years: 6.00    Types: Cigarettes  . Smokeless tobacco: Never Used  Substance Use Topics  . Alcohol use: Yes    Alcohol/week: 0.0 standard drinks    Comment: social  . Drug use: No     Allergies   Propranolol   Review of Systems Review of Systems  Constitutional: Positive for activity change.    Musculoskeletal: Positive for back pain.  Allergic/Immunologic: Negative for immunocompromised state.  Neurological: Positive for weakness and numbness.  Hematological: Does not bruise/bleed easily.     Physical Exam Updated Vital Signs BP 120/80 (BP Location: Left Arm)   Pulse 98   Temp 97.7 F (36.5 C) (Oral)   Resp 16   Ht 5\' 7"  (1.702 m)   Wt 99.8 kg   LMP 07/16/2018   SpO2 100%   BMI 34.46 kg/m   Physical Exam  Constitutional: She is oriented to person, place, and time. She appears well-developed.  HENT:  Head: Normocephalic and atraumatic.  Eyes: EOM are normal.  Neck: Normal range of motion. Neck supple.  Cardiovascular: Normal rate.  Pulmonary/Chest: Effort normal.  Abdominal: Bowel sounds are normal.  Musculoskeletal:  Pt has tenderness over the lumbar region No step offs, no erythema. Pt has 2+ patellar reflex in the left lower extremity, 1+ patellar reflex in the right lower extremity. Able to discriminate between sharp and dull, but has subjective paresthesias unable to ambulate Positive passive straight leg raise test bilaterally  Neurological: She is alert and oriented to person, place, and time.  Skin: Skin is warm and dry.  Nursing note and vitals reviewed.    ED Treatments / Results  Labs (all labs ordered are listed, but only abnormal results are displayed) Labs Reviewed - No data to display  EKG None  Radiology No results found.  Procedures Procedures (including critical care time)  Medications Ordered in ED Medications  ketorolac (TORADOL) 15 MG/ML injection 15 mg (15 mg Intravenous Given 07/30/18 0541)  HYDROmorphone (DILAUDID) injection 1 mg (1 mg Intravenous Given 07/30/18 0541)  diazepam (VALIUM) tablet 5 mg (5 mg Oral Given 07/30/18 0541)     Initial Impression / Assessment and Plan / ED Course  I have reviewed the triage vital signs and the nursing notes.  Pertinent labs & imaging results that were available during my care  of the patient were reviewed by me and considered in my medical decision making (see chart for details).  Clinical Course as of Jul 31 623  Fri Jul 30, 2018  1610 Patient reassessed. She had received her medications few minutes ago.  She reports that her pain is improved significantly.  However she continues to have numbness in both of her lower extremities.  We tried to get patient up and walker, and she noted that she continued to have bilateral lower extremity weakness.  She is unable to lift her leg like she normally would.  Patient's pain also got severe during her attempt to try and walker.  The patient was not able to successfully walk during our evaluation either.  At this time we  will get MRI, as there is suspicion for severe herniation of the disc. Patient has been made aware of the plan.   [AN]    Clinical Course User Index [AN] Derwood Kaplan, MD    32 year old female comes in with chief complaint of back pain.  She has history of severe degenerative disc disease of her lumbar spine.  Patient was going to the bathroom in the middle the night when she had severe back pain with associated subjective paresthesias and lower extremity weakness.  Based on her exam, there is slightly diminished patellar reflex in the right lower extremity along with subjective paresthesias.  No other red flags suggesting cord compression or cauda equina.  We will start with medications for symptom control and reassess to see if MRI of the lumbar spine is indicated emergently.  Final Clinical Impressions(s) / ED Diagnoses   Final diagnoses:  Lumbar radiculopathy    ED Discharge Orders    None       Derwood Kaplan, MD 07/30/18 [phone]

## 2018-07-30 NOTE — ED Notes (Signed)
Patient transported to MRI 

## 2018-07-30 NOTE — ED Provider Notes (Signed)
  Physical Exam  BP 115/74   Pulse 77   Temp 97.7 F (36.5 C) (Oral)   Resp 16   Ht 5\' 7"  (1.702 m)   Wt 99.8 kg   LMP 07/16/2018   SpO2 95%   BMI 34.46 kg/m   Physical Exam  ED Course/Procedures   Clinical Course as of Jul 31 931  Fri Jul 30, 2018  0622 Patient reassessed. She had received her medications few minutes ago.  She reports that her pain is improved significantly.  However she continues to have numbness in both of her lower extremities.  We tried to get patient up and walker, and she noted that she continued to have bilateral lower extremity weakness.  She is unable to lift her leg like she normally would.  Patient's pain also got severe during her attempt to try and walker.  The patient was not able to successfully walk during our evaluation either.  At this time we will get MRI, as there is suspicion for severe herniation of the disc. Patient has been made aware of the plan.   [AN]    Clinical Course User Index [AN] Derwood KaplanNanavati, Ankit, MD    Procedures  MDM  Care assumed from Dr. Shyrl NumbersNanavanti at 7:30 am. Patient had acute onset of back pain while walking around 1 am. Denies any trauma. Had previous MRI that showed lumbar disc disease. Able to ambulate but has numbness bilateral lower extremities so MRI lumbar spine ordered.   9:34 AM MRI showed L 5 disc herniation with L5 radiculitis. Given steroids, pain meds. Pain improved. Was ambulatory on arrival. Will dc home with pain meds, steroids, neurosurgery referral.      Charlynne PanderYao, Sullivan Jacuinde Hsienta, MD 07/30/18 [phone]

## 2018-07-30 NOTE — Discharge Instructions (Addendum)
You have a slipped disc that is pinching your nerve causing you pain.   Take medrol dose pack as prescribed.   Take motrin for pain.   Continue xanaflex   Take percocet for severe pain   Use voltaren gel to your back for comfort.   No heavy lifting for a week   Call neurosurgery today for appointment   Return to ER if you have worse weakness, numbness, trouble urinating, difficulty walking, severe pain.

## 2019-07-04 ENCOUNTER — Emergency Department (HOSPITAL_COMMUNITY)
Admission: EM | Admit: 2019-07-04 | Discharge: 2019-07-05 | Disposition: A | Discharge status: Home / Self Care | Payer: BC Managed Care – PPO | Attending: Emergency Medicine | Admitting: Emergency Medicine

## 2019-07-04 DIAGNOSIS — R079 Chest pain, unspecified: Secondary | ICD-10-CM

## 2019-07-04 DIAGNOSIS — F17210 Nicotine dependence, cigarettes, uncomplicated: Secondary | ICD-10-CM | POA: Diagnosis not present

## 2019-07-04 DIAGNOSIS — R0789 Other chest pain: Secondary | ICD-10-CM | POA: Insufficient documentation

## 2019-07-04 DIAGNOSIS — R0602 Shortness of breath: Secondary | ICD-10-CM | POA: Diagnosis not present

## 2019-07-04 DIAGNOSIS — R2243 Localized swelling, mass and lump, lower limb, bilateral: Secondary | ICD-10-CM | POA: Diagnosis not present

## 2019-07-04 DIAGNOSIS — X501XXA Overexertion from prolonged static or awkward postures, initial encounter: Secondary | ICD-10-CM | POA: Diagnosis not present

## 2019-07-04 DIAGNOSIS — Z79899 Other long term (current) drug therapy: Secondary | ICD-10-CM | POA: Insufficient documentation

## 2019-07-04 DIAGNOSIS — M79605 Pain in left leg: Secondary | ICD-10-CM | POA: Insufficient documentation

## 2019-07-05 ENCOUNTER — Other Ambulatory Visit: Payer: Self-pay

## 2019-07-05 ENCOUNTER — Emergency Department (HOSPITAL_BASED_OUTPATIENT_CLINIC_OR_DEPARTMENT_OTHER): Payer: BC Managed Care – PPO

## 2019-07-05 ENCOUNTER — Encounter (HOSPITAL_COMMUNITY): Payer: Self-pay | Admitting: Emergency Medicine

## 2019-07-05 ENCOUNTER — Emergency Department (HOSPITAL_COMMUNITY): Payer: BC Managed Care – PPO

## 2019-07-05 ENCOUNTER — Encounter: Payer: Self-pay | Admitting: Adult Health

## 2019-07-05 ENCOUNTER — Ambulatory Visit (INDEPENDENT_AMBULATORY_CARE_PROVIDER_SITE_OTHER): Payer: BC Managed Care – PPO | Admitting: Adult Health

## 2019-07-05 DIAGNOSIS — R609 Edema, unspecified: Secondary | ICD-10-CM

## 2019-07-05 DIAGNOSIS — R600 Localized edema: Secondary | ICD-10-CM | POA: Diagnosis not present

## 2019-07-05 DIAGNOSIS — R0602 Shortness of breath: Secondary | ICD-10-CM | POA: Diagnosis not present

## 2019-07-05 LAB — CBC
HCT: 37.8 % (ref 36.0–46.0)
Hemoglobin: 12.8 g/dL (ref 12.0–15.0)
MCH: 31.4 pg (ref 26.0–34.0)
MCHC: 33.9 g/dL (ref 30.0–36.0)
MCV: 92.9 fL (ref 80.0–100.0)
Platelets: 283 10*3/uL (ref 150–400)
RBC: 4.07 MIL/uL (ref 3.87–5.11)
RDW: 12.1 % (ref 11.5–15.5)
WBC: 6.6 10*3/uL (ref 4.0–10.5)
nRBC: 0 % (ref 0.0–0.2)

## 2019-07-05 LAB — BASIC METABOLIC PANEL
Anion gap: 9 (ref 5–15)
BUN: 7 mg/dL (ref 6–20)
CO2: 24 mmol/L (ref 22–32)
Calcium: 9.4 mg/dL (ref 8.9–10.3)
Chloride: 103 mmol/L (ref 98–111)
Creatinine, Ser: 0.82 mg/dL (ref 0.44–1.00)
GFR calc Af Amer: >60 mL/min (ref >60)
GFR calc non Af Amer: >60 mL/min (ref >60)
Glucose, Bld: 85 mg/dL (ref 70–99)
Potassium: 3.8 mmol/L (ref 3.5–5.1)
Sodium: 136 mmol/L (ref 135–145)

## 2019-07-05 LAB — TROPONIN I (HIGH SENSITIVITY)
Troponin I (High Sensitivity): <2 ng/L (ref <18)
Troponin I (High Sensitivity): 4 ng/L (ref <18)

## 2019-07-05 LAB — I-STAT BETA HCG BLOOD, ED (MC, WL, AP ONLY): I-stat hCG, quantitative: <5 m[IU]/mL (ref <5)

## 2019-07-05 LAB — D-DIMER, QUANTITATIVE: D-Dimer, Quant: 0.29 ug/mL-FEU (ref 0.00–0.50)

## 2019-07-05 MED ORDER — HYDROCHLOROTHIAZIDE 12.5 MG PO TABS: 12.5000 mg | ORAL_TABLET | Freq: Every day | ORAL | 0 refills | Status: DC

## 2019-07-05 MED ORDER — SODIUM CHLORIDE 0.9% FLUSH: 3.0000 mL | Freq: Once | INTRAVENOUS | Status: DC

## 2019-07-05 NOTE — ED Notes (Signed)
Pt verbalized understanding of d/c instructions and has no further questions, VSS, NAD.  

## 2019-07-05 NOTE — ED Triage Notes (Signed)
Pt works at [employer].  Reports swelling and pain to L lower leg since this morning.  Reports SOB when lying down for the past few nights.  Denies SOB with exertion.  Reports intermittent pain across chest that started tonight and nausea.

## 2019-07-05 NOTE — ED Provider Notes (Signed)
[facility] Provider [facility]   CSN: [id number] Arrival date & time: 07/04/19  2314    History   Chief Complaint Chief Complaint  Patient presents with   Leg Pain   Chest Pain    HPI Carrie Reed is a 33 y.o. female.     The history is provided by the patient.  Chest Pain Pain location:  L chest Pain quality: aching   Pain radiates to:  Does not radiate Pain severity:  Mild Onset quality:  Gradual Progression:  Resolved Chronicity:  New Context: at rest   Relieved by:  Nothing Worsened by:  Nothing Associated symptoms: no abdominal pain, no anorexia, no anxiety, no back pain, no cough, no fever, no palpitations, no PND, no shortness of breath and no vomiting   Associated symptoms comment:  Left leg pain and swelling Risk factors: no coronary artery disease, no high cholesterol, no hypertension and no prior DVT/PE     Past Medical History:  Diagnosis Date   Bulging of cervical intervertebral disc    Degenerative disc disease, lumbar    Migraine     Patient Active Problem List   Diagnosis Date Noted   Degenerative disc disease, lumbar 12/24/2017   Bulging of cervical intervertebral disc 12/24/2017   Acute maxillary sinusitis 11/11/2017   Cough 11/11/2017   Chronic RUQ pain 08/12/2017   Healthcare maintenance 08/12/2017   Intractable chronic migraine without aura and without status migrainosus 04/27/2016   Migraine 08/02/2015   Pulsatile tinnitus of left ear 08/02/2015   Blurry vision 08/02/2015   Worsening headaches 08/02/2015    Past Surgical History:  Procedure Laterality Date   APPENDECTOMY     TONSILLECTOMY AND ADENOIDECTOMY     x2   TUBAL LIGATION     TYMPANOSTOMY TUBE PLACEMENT     UTERINE TEAR     d/t late stage abortion     OB History   No obstetric history on file.      Home Medications    Prior to Admission medications   Medication Sig Start Date End Date Taking?  Authorizing Provider  diclofenac sodium (VOLTAREN) 1 % GEL Apply 4 g topically 4 (four) times daily. 07/30/18   Charlynne PanderYao, David Hsienta, MD  ibuprofen (ADVIL,MOTRIN) 800 MG tablet Take 1 tablet (800 mg total) by mouth 3 (three) times daily. 07/30/18   Charlynne PanderYao, David Hsienta, MD  methylPREDNISolone (MEDROL DOSEPAK) 4 MG TBPK tablet Use as prescribed 07/30/18   Charlynne PanderYao, David Hsienta, MD  ondansetron (ZOFRAN ODT) 4 MG disintegrating tablet Take 1 tablet (4 mg total) by mouth every 8 (eight) hours as needed for nausea or vomiting. Patient not taking: Reported on 07/30/2018 08/06/17   Street, LowellMercedes, PA-C  oxyCODONE-acetaminophen (PERCOCET) 5-325 MG tablet Take 1 tablet by mouth every 6 (six) hours as needed. 07/30/18   Charlynne PanderYao, David Hsienta, MD  tiZANidine (ZANAFLEX) 2 MG tablet Take 2 mg by mouth 3 (three) times daily as needed for muscle spasms.     [provider]  venlafaxine XR (EFFEXOR-XR) 150 MG 24 hr capsule TAKE 1 CAPSULE BY MOUTH DAILY WITH BREAKFAST. Patient not taking: Reported on 07/30/2018 12/25/16   Anson FretAhern, Antonia B, MD    Family History Family History  Problem Relation Age of Onset   Cirrhosis Mother    Kidney disease Mother    Diabetes Mother    Stroke Mother    Clotting disorder Mother    Heart failure Father    Heart disease Father  Heart attack Father    Diabetes Father    Hyperlipidemia Father    Cancer Sister        cervical cancer   Hypertension Brother    Clotting disorder Brother    High blood pressure Brother    Cervical cancer Sister    Suicidality Maternal Uncle    Suicidality Paternal Aunt    Diabetes Paternal Grandmother    Cancer Paternal Grandfather        throat   Suicidality Cousin    Migraines Neg Hx     Social History Social History   Tobacco Use   Smoking status: Current Every Day Smoker    Packs/day: 0.50    Years: 12.00    Pack years: 6.00    Types: Cigarettes   Smokeless tobacco: Never Used  Substance Use Topics    Alcohol use: Yes    Alcohol/week: 0.0 standard drinks    Comment: social   Drug use: No     Allergies   Propranolol   Review of Systems Review of Systems  Constitutional: Negative for chills and fever.  HENT: Negative for ear pain and sore throat.   Eyes: Negative for pain and visual disturbance.  Respiratory: Negative for cough and shortness of breath.   Cardiovascular: Positive for chest pain and leg swelling. Negative for palpitations and PND.  Gastrointestinal: Negative for abdominal pain, anorexia and vomiting.  Genitourinary: Negative for dysuria and hematuria.  Musculoskeletal: Negative for arthralgias and back pain.  Skin: Negative for color change and rash.  Neurological: Negative for seizures and syncope.  All other systems reviewed and are negative.    Physical Exam Updated Vital Signs  ED Triage Vitals  Enc Vitals Group     BP 07/05/19 0507 121/81     Pulse Rate 07/05/19 0507 74     Resp 07/05/19 0507 18     Temp 07/05/19 0742 97.8 F (36.6 C)     Temp src --      SpO2 07/05/19 0507 100 %     Weight 07/05/19 0740 228 lb (103.4 kg)     Height 07/05/19 0740 5\' 7"  (1.702 m)     Head Circumference --      Peak Flow --      Pain Score 07/05/19 0029 4     Pain Loc --      Pain Edu? --      Excl. in GC? --     Physical Exam Vitals signs and nursing note reviewed.  Constitutional:      General: She is not in acute distress.    Appearance: She is well-developed.  HENT:     Head: Normocephalic and atraumatic.  Eyes:     Extraocular Movements: Extraocular movements intact.     Conjunctiva/sclera: Conjunctivae normal.     Pupils: Pupils are equal, round, and reactive to light.  Neck:     Musculoskeletal: Normal range of motion and neck supple.  Cardiovascular:     Rate and Rhythm: Normal rate and regular rhythm.     Pulses:          Radial pulses are 2+ on the right side and 2+ on the left side.       Dorsalis pedis pulses are 2+ on the right side.       Heart sounds: Normal heart sounds. No murmur.  Pulmonary:     Effort: Pulmonary effort is normal. No respiratory distress.     Breath sounds: Normal breath sounds. No  decreased breath sounds, wheezing or rhonchi.  Abdominal:     Palpations: Abdomen is soft.     Tenderness: There is no abdominal tenderness.  Musculoskeletal:     Right lower leg: Edema (trace) present.     Left lower leg: Edema (trace) present.  Skin:    General: Skin is warm and dry.     Capillary Refill: Capillary refill takes less than 2 seconds.  Neurological:     General: No focal deficit present.     Mental Status: She is alert.  Psychiatric:        Mood and Affect: Mood normal.      ED Treatments / Results  Labs (all labs ordered are listed, but only abnormal results are displayed) Labs Reviewed  BASIC METABOLIC PANEL  CBC  D-DIMER, QUANTITATIVE (NOT AT [facility])  I-STAT BETA HCG BLOOD, ED ([facility], [facility], [facility] ONLY)  TROPONIN I (HIGH SENSITIVITY)  TROPONIN I (HIGH SENSITIVITY)    EKG EKG Interpretation  Date/Time:  Tuesday July 05 2019 00:28:43 EDT Ventricular Rate:  90 PR Interval:  154 QRS Duration: 104 QT Interval:  366 QTC Calculation: 447 R Axis:   60 Text Interpretation:  Normal sinus rhythm with sinus arrhythmia Normal ECG No significant change since last tracing Confirmed by Merrily Pew [phone]) on 07/05/2019 5:05:04 AM Also confirmed by Merrily Pew [phone]), editor Hattie Perch ([id number])  on 07/05/2019 6:42:22 AM   Radiology Dg Chest Port 1 View  Result Date: 07/05/2019 CLINICAL DATA:  Initial evaluation for acute shortness of breath. EXAM: PORTABLE CHEST 1 VIEW COMPARISON:  Prior radiograph from 05/13/2016. FINDINGS: The cardiac and mediastinal silhouettes are stable in size and contour, and remain within normal limits. The lungs are normally inflated. No airspace consolidation, pleural effusion, or pulmonary edema is identified. There is no pneumothorax. No acute osseous abnormality  identified. IMPRESSION: No active cardiopulmonary disease. Electronically Signed   By: Jeannine Boga M.D.   On: 07/05/2019 01:52   Vas [place] Lower Extremity Venous (dvt) ([facility] 7a-7p)  Result Date: 07/05/2019  Lower Venous Study Indications: Edema.  Risk Factors: None identified. Limitations: Body habitus and poor ultrasound/tissue interface. Comparison Study: No prior studies. Performing Technologist: Oliver Hum RVT  Examination Guidelines: A complete evaluation includes B-mode imaging, spectral Doppler, color Doppler, and power Doppler as needed of all accessible portions of each vessel. Bilateral testing is considered an integral part of a complete examination. Limited examinations for reoccurring indications may be performed as noted.  +-----+---------------+---------+-----------+----------+-------+  RIGHT Compressibility Phasicity Spontaneity Properties Summary  +-----+---------------+---------+-----------+----------+-------+  CFV   Full            Yes       Yes                             +-----+---------------+---------+-----------+----------+-------+   +---------+---------------+---------+-----------+----------+-------+  LEFT      Compressibility Phasicity Spontaneity Properties Summary  +---------+---------------+---------+-----------+----------+-------+  CFV       Full            Yes       Yes                             +---------+---------------+---------+-----------+----------+-------+  SFJ       Full                                                      +---------+---------------+---------+-----------+----------+-------+  FV Prox   Full                                                      +---------+---------------+---------+-----------+----------+-------+  FV Mid    Full                                                      +---------+---------------+---------+-----------+----------+-------+  FV Distal Full                                                       +---------+---------------+---------+-----------+----------+-------+  PFV       Full                                                      +---------+---------------+---------+-----------+----------+-------+  POP       Full            Yes       Yes                             +---------+---------------+---------+-----------+----------+-------+  PTV       Full                                                      +---------+---------------+---------+-----------+----------+-------+  PERO      Full                                                      +---------+---------------+---------+-----------+----------+-------+     Summary: Right: No evidence of common femoral vein obstruction. Left: There is no evidence of deep vein thrombosis in the lower extremity. No cystic structure found in the popliteal fossa.  *See table(s) above for measurements and observations.    Preliminary     Procedures Procedures (including critical care time)  Medications Ordered in ED Medications  sodium chloride flush (NS) 0.9 % injection 3 mL (has no administration in time range)     Initial Impression / Assessment and Plan / ED Course  I have reviewed the triage vital signs and the nursing notes.  Pertinent labs & imaging results that were available during my care of the patient were reviewed by me and considered in my medical decision making (see chart for details).     Carrie CollardJamie Reed is a 33 year old female who presents the ED with left lower leg swelling, chest pain.  Patient with normal vitals.  No fever.  Patient had brief chest pain earlier this morning that has now resolved.  Mostly concerned about left lower leg swelling.  Does stand and sit at prolonged times at work.  No trauma history.  Patient has good pulses.  Doubt arterial process in her legs.  No infectious process in the legs.  Lab work done prior to my evaluation shows EKG with sinus rhythm.  No ischemic changes.  Troponin x2 is negative.  Doubt ACS.   D-dimer normal and doubt PE.  Ultrasound of the left lower leg showed no DVT.  Chest x-ray showed no pneumonia, no pneumothorax, no pleural effusion.  Patient with no significant anemia, electrolyte abnormality, kidney injury.  Suspect that this is likely MSK in nature or venous stasis from prolonged standing/sitting.  Recommended exercises at home, Tylenol, Motrin.  Patient was discharged from ED in good condition.  Given return precautions.  This chart was dictated using voice recognition software.  Despite best efforts to proofread,  errors can occur which can change the documentation meaning.    Final Clinical Impressions(s) / ED Diagnoses   Final diagnoses:  Left leg pain  Chest pain, unspecified type    ED Discharge Orders    None       Virgina NorfolkCuratolo, Olly Shiner, DO 07/05/19 [id number]

## 2019-07-05 NOTE — Patient Instructions (Signed)
Edema  Edema is an abnormal buildup of fluids in the body tissues and under the skin. Swelling of the legs, feet, and ankles is a common symptom that becomes more likely as you get older. Swelling is also common in looser tissues, like around the eyes. When the affected area is squeezed, the fluid may move out of that spot and leave a dent for a few moments. This dent is called pitting edema. There are many possible causes of edema. Eating too much salt (sodium) and being on your feet or sitting for a long time can cause edema in your legs, feet, and ankles. Hot weather may make edema worse. Common causes of edema include:  Heart failure.  Liver or kidney disease.  Weak leg blood vessels.  Cancer.  An injury.  Pregnancy.  Medicines.  Being obese.  Low protein levels in the blood. Edema is usually painless. Your skin may look swollen or shiny. Follow these instructions at home:  Keep the affected body part raised (elevated) above the level of your heart when you are sitting or lying down.  Do not sit still or stand for long periods of time.  Do not wear tight clothing. Do not wear garters on your upper legs.  Exercise your legs to get your circulation going. This helps to move the fluid back into your blood vessels, and it may help the swelling go down.  Wear elastic bandages or support stockings to reduce swelling as told by your health care provider.  Eat a low-salt (low-sodium) diet to reduce fluid as told by your health care provider.  Depending on the cause of your swelling, you may need to limit how much fluid you drink (fluid restriction).  Take over-the-counter and prescription medicines only as told by your health care provider. Contact a health care provider if:  Your edema does not get better with treatment.  You have heart, liver, or kidney disease and have symptoms of edema.  You have sudden and unexplained weight gain. Get help right away if:  You develop  shortness of breath or chest pain.  You cannot breathe when you lie down.  You develop pain, redness, or warmth in the swollen areas.  You have heart, liver, or kidney disease and suddenly get edema.  You have a fever and your symptoms suddenly get worse. Summary  Edema is an abnormal buildup of fluids in the body tissues and under the skin.  Eating too much salt (sodium) and being on your feet or sitting for a long time can cause edema in your legs, feet, and ankles.  Keep the affected body part raised (elevated) above the level of your heart when you are sitting or lying down. This information is not intended to replace advice given to you by your health care provider. Make sure you discuss any questions you have with your health care provider. Document Released: 11/24/2005 Document Revised: 11/27/2017 Document Reviewed: 12/27/2016 Elsevier Patient Education  [place].  Please take Hydrochlorothiazide 12.5mg  once daily-take for 6 days Please obtain compression socks, at least 15-10mmg (medium pressure) Wear daily- while awake- for the next two weeks. Then always wear when working moving forward. Continue to drink plenty of water and follow heart healthy diet. Continue to remain off tobacco- GREAT JOB!!! Please schedule TeleMedicine appt in 2 weeks. Continue to social distance and wear a mask when in public. GREAT TO SEE YOU!

## 2019-07-05 NOTE — Assessment & Plan Note (Signed)
Please take Hydrochlorothiazide 12.5mg  once daily-take for 6 days Please obtain compression socks, at least 15-50mmg (medium pressure) Wear daily- while awake- for the next two weeks. Then always wear when working moving forward. Continue to drink plenty of water and follow heart healthy diet. Continue to remain off tobacco- GREAT JOB!!! Please schedule TeleMedicine appt in 2 weeks. Continue to social distance and wear a mask when in public.

## 2019-07-05 NOTE — Progress Notes (Signed)
Left lower extremity venous duplex has been completed. Preliminary results can be found in CV Proc through chart review.  Results were given to Dr. Ronnald Nian.   07/05/19 8:49 AM Carrie Reed RVT

## 2019-07-05 NOTE — Progress Notes (Signed)
Subjective:    Patient ID: Carrie Reed, female    DOB: 1986/06/12, 33 y.o.   MRN: [id number]  HPI:  Ms. Carrie Reed presents with bil lower ext swelling that developed 48 hrs ago when she came off night shift from [facility]. 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 She denies pain of lower ext's She denies recent accident/injury of lower extremities She reports wearing scrubs, regular socks, and crocks when working She denies this type of swelling ever occurring beforehand  She was seen at [facility] ED yesterday "Carrie Reed is a 33 year old female who presents the ED with left lower leg swelling, chest pain.  Patient with normal vitals.  No fever.  Patient had brief chest pain earlier this morning that has now resolved.  Mostly concerned about left lower leg swelling.  Does stand and sit at prolonged times at work.  No trauma history.  Patient has good pulses.  Doubt arterial process in her legs.  No infectious process in the legs.  Lab work done prior to my evaluation shows EKG with sinus rhythm.  No ischemic changes.  Troponin x2 is negative.  Doubt ACS.  D-dimer normal and doubt PE.  Ultrasound of the left lower leg showed no DVT.  Chest x-ray showed no pneumonia, no pneumothorax, no pleural effusion.  Patient with no significant anemia, electrolyte abnormality, kidney injury.  Suspect that this is likely MSK in nature or venous stasis from prolonged standing/sitting.  Recommended exercises at home, Tylenol, Motrin.  Patient was discharged from ED in good condition.  Given return precautions" BMP 07/05/2019 GFR >60 K+ 3.8 Na+ 136  Patient Care Team    Relationship Specialty Notifications Start End  Esaw Grandchild, NP PCP - General Family Medicine  08/12/17     Patient Active Problem List   Diagnosis Date Noted  . Lower extremity edema 07/05/2019  . Degenerative disc disease, lumbar 12/24/2017  . Bulging of  cervical intervertebral disc 12/24/2017  . Acute maxillary sinusitis 11/11/2017  . Cough 11/11/2017  . Chronic RUQ pain 08/12/2017  . Healthcare maintenance 08/12/2017  . Intractable chronic migraine without aura and without status migrainosus 04/27/2016  . Migraine 08/02/2015  . Pulsatile tinnitus of left ear 08/02/2015  . Blurry vision 08/02/2015  . Worsening headaches 08/02/2015     Past Medical History:  Diagnosis Date  . Bulging of cervical intervertebral disc   . Degenerative disc disease, lumbar   . Migraine      Past Surgical History:  Procedure Laterality Date  . APPENDECTOMY    . TONSILLECTOMY AND ADENOIDECTOMY     x2  . TUBAL LIGATION    . TYMPANOSTOMY TUBE PLACEMENT    . UTERINE TEAR     d/t late stage abortion     Family History  Problem Relation Age of Onset  . Cirrhosis Mother   . Kidney disease Mother   . Diabetes Mother   . Stroke Mother   . Clotting disorder Mother   . Heart failure Father   . Heart disease Father   . Heart attack Father   . Diabetes Father   . Hyperlipidemia Father   . Cancer Sister        cervical cancer  . Hypertension Brother   . Clotting disorder Brother   . High blood pressure Brother   . Cervical cancer Sister   . Suicidality Maternal Uncle   . Suicidality Paternal Aunt   .  Diabetes Paternal Grandmother   . Cancer Paternal Grandfather        throat  . Suicidality Cousin   . Migraines Neg Hx      Social History   Substance and Sexual Activity  Drug Use No     Social History   Substance and Sexual Activity  Alcohol Use Yes  . Alcohol/week: 0.0 standard drinks   Comment: social     Social History   Tobacco Use  Smoking Status Current Every Day Smoker  . Packs/day: 0.50  . Years: 12.00  . Pack years: 6.00  . Types: Cigarettes  Smokeless Tobacco Never Used     Outpatient Encounter Medications as of 07/05/2019  Medication Sig  . ibuprofen (ADVIL,MOTRIN) 800 MG tablet Take 1 tablet (800 mg  total) by mouth 3 (three) times daily.  Marland Kitchen tiZANidine (ZANAFLEX) 2 MG tablet Take 2 mg by mouth 3 (three) times daily as needed for muscle spasms.   Marland Kitchen venlafaxine XR (EFFEXOR-XR) 150 MG 24 hr capsule TAKE 1 CAPSULE BY MOUTH DAILY WITH BREAKFAST.  . hydrochlorothiazide (HYDRODIURIL) 12.5 MG tablet Take 1 tablet (12.5 mg total) by mouth daily.  . [DISCONTINUED] diclofenac sodium (VOLTAREN) 1 % GEL Apply 4 g topically 4 (four) times daily.  . [DISCONTINUED] methylPREDNISolone (MEDROL DOSEPAK) 4 MG TBPK tablet Use as prescribed  . [DISCONTINUED] ondansetron (ZOFRAN ODT) 4 MG disintegrating tablet Take 1 tablet (4 mg total) by mouth every 8 (eight) hours as needed for nausea or vomiting. (Patient not taking: Reported on 07/30/2018)  . [DISCONTINUED] oxyCODONE-acetaminophen (PERCOCET) 5-325 MG tablet Take 1 tablet by mouth every 6 (six) hours as needed.  . [DISCONTINUED] sodium chloride flush (NS) 0.9 % injection 3 mL    No facility-administered encounter medications on file as of 07/05/2019.    Allergies: Propranolol  Body mass index is 37.65 kg/m.  Blood pressure 134/87, pulse 84, temperature 98.5 F (36.9 C), temperature source Oral, height 5' 6.5" (1.689 m), weight 236 lb 12.8 oz (107.4 kg), last menstrual period 06/08/2019, SpO2 97 %.  Review of Systems  Constitutional: Positive for fatigue. Negative for activity change, appetite change, chills, diaphoresis, fever and unexpected weight change.  Eyes: Negative for visual disturbance.  Respiratory: Negative for cough, chest tightness, shortness of breath, wheezing and stridor.   Cardiovascular: Positive for leg swelling. Negative for chest pain and palpitations.  Endocrine: Negative for cold intolerance, heat intolerance, polydipsia, polyphagia and polyuria.  Musculoskeletal: Positive for joint swelling. Negative for arthralgias, back pain, gait problem, myalgias, neck pain and neck stiffness.  Neurological: Negative for dizziness and  headaches.       Objective:   Physical Exam Vitals signs and nursing note reviewed.  Constitutional:      General: She is not in acute distress.    Appearance: She is obese. She is not ill-appearing, toxic-appearing or diaphoretic.  HENT:     Head: Normocephalic and atraumatic.  Cardiovascular:     Rate and Rhythm: Normal rate and regular rhythm.     Pulses: Normal pulses.     Heart sounds: Normal heart sounds. No murmur. No friction rub. No gallop.   Pulmonary:     Effort: Pulmonary effort is normal. No respiratory distress.     Breath sounds: No stridor. No wheezing, rhonchi or rales.  Chest:     Chest wall: No tenderness.  Musculoskeletal:        General: Swelling present. No tenderness.     Right ankle: She exhibits swelling. She exhibits normal range  of motion. No tenderness.     Left ankle: She exhibits swelling. She exhibits normal range of motion. No tenderness.     Right lower leg: Edema present.     Left lower leg: Edema present.     Right foot: Normal range of motion and normal capillary refill. Swelling present. No tenderness.     Left foot: Normal range of motion and normal capillary refill. Swelling present. No tenderness.  Skin:    General: Skin is warm and dry.  Neurological:     Mental Status: She is alert.       Assessment & Plan:   1. Lower extremity edema     Lower extremity edema Please take Hydrochlorothiazide 12.60m once daily-take for 6 days Please obtain compression socks, at least 15-21m (medium pressure) Wear daily- while awake- for the next two weeks. Then always wear when working moving forward. Continue to drink plenty of water and follow heart healthy diet. Continue to remain off tobacco- GREAT JOB!!! Please schedule TeleMedicine appt in 2 weeks. Continue to social distance and wear a mask when in public.    FOLLOW-UP:  Return if symptoms worsen or fail to improve.

## 2019-07-17 NOTE — Progress Notes (Signed)
Virtual Visit via Video Note  I connected with Carrie Reed on 07/19/19 at  3:30 PM EDT by a video enabled telemedicine application and verified that I am speaking with the correct person using two identifiers.  Location: Patient:Home  Provider:In Clinic   I discussed the limitations of evaluation and management by telemedicine and the availability of in person appointments. The patient expressed understanding and agreed to proceed.  History of Present Illness: 07/05/2019 OV: Ms. Carrie Reed presents with bil lower ext swelling that developed 48 hrs ago when she came off night shift from [employer] [facility] [employer]. 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 She denies pain of lower ext's She denies recent accident/injury of lower extremities She reports wearing scrubs, regular socks, and crocks when working She denies this type of swelling ever occurring beforehand  She was seen at [facility] ED yesterday "Carrie McBrideis a 33 year old female who presents the ED with left lower leg swelling, chest pain. Patient with normal vitals. No fever. Patient had brief chest pain earlier this morning that has now resolved. Mostly concerned about left lower leg swelling. Does stand and sit at prolonged times at work. No trauma history. Patient has good pulses. Doubt arterial process in her legs. No infectious process in the legs. Lab work done prior to my evaluation shows EKG with sinus rhythm. No ischemic changes. Troponin x2 is negative. Doubt ACS. D-dimer normal and doubt PE. Ultrasound of the left lower leg showed no DVT. Chest x-ray showed no pneumonia, no pneumothorax, no pleural effusion. Patient with no significant anemia, electrolyte abnormality, kidney injury. Suspect that this is likely MSK in nature or venous stasis from prolonged standing/sitting. Recommended exercises at home, Tylenol, Motrin. Patient was discharged fromED  ingood condition. Given return precautions" BMP 07/05/2019 GFR >60 K+ 3.8 Na+ 136  07/19/2019 OV: Ms. Carrie Reed is calling for  f/u: bil lower ext edema She has taken the HCTZ 12.61m QD for 6 days She has been elevating her legs when able and wearing compression socks while awake She estimates that the lower ext edema has reduced >80% She denies CP/palpitations/dizziness She estimates to drink >60 oz water/dayShe has been trying to follow DASH diet  Patient Care Team    Relationship Specialty Notifications Start End  DEsaw Grandchild NP PCP - General Family Medicine  08/12/17     Patient Active Problem List   Diagnosis Date Noted  . Lower extremity edema 07/05/2019  . Degenerative disc disease, lumbar 12/24/2017  . Bulging of cervical intervertebral disc 12/24/2017  . Acute maxillary sinusitis 11/11/2017  . Cough 11/11/2017  . Chronic RUQ pain 08/12/2017  . Healthcare maintenance 08/12/2017  . Intractable chronic migraine without aura and without status migrainosus 04/27/2016  . Migraine 08/02/2015  . Pulsatile tinnitus of left ear 08/02/2015  . Blurry vision 08/02/2015  . Worsening headaches 08/02/2015     Past Medical History:  Diagnosis Date  . Bulging of cervical intervertebral disc   . Degenerative disc disease, lumbar   . Migraine      Past Surgical History:  Procedure Laterality Date  . APPENDECTOMY    . TONSILLECTOMY AND ADENOIDECTOMY     x2  . TUBAL LIGATION    . TYMPANOSTOMY TUBE PLACEMENT    . UTERINE TEAR     d/t late stage abortion     Family History  Problem Relation Age of Onset  . Cirrhosis Mother   . Kidney disease Mother   .  Diabetes Mother   . Stroke Mother   . Clotting disorder Mother   . Heart failure Father   . Heart disease Father   . Heart attack Father   . Diabetes Father   . Hyperlipidemia Father   . Cancer Sister        cervical cancer  . Hypertension Brother   . Clotting disorder Brother   . High blood pressure Brother    . Cervical cancer Sister   . Suicidality Maternal Uncle   . Suicidality Paternal Aunt   . Diabetes Paternal Grandmother   . Cancer Paternal Grandfather        throat  . Suicidality Cousin   . Migraines Neg Hx      Social History   Substance and Sexual Activity  Drug Use No     Social History   Substance and Sexual Activity  Alcohol Use Yes  . Alcohol/week: 0.0 standard drinks   Comment: social     Social History   Tobacco Use  Smoking Status Current Every Day Smoker  . Packs/day: 0.50  . Years: 12.00  . Pack years: 6.00  . Types: Cigarettes  Smokeless Tobacco Never Used     Outpatient Encounter Medications as of 07/19/2019  Medication Sig  . hydrochlorothiazide (HYDRODIURIL) 12.5 MG tablet Take 1 tablet (12.5 mg total) by mouth daily.  Marland Kitchen ibuprofen (ADVIL,MOTRIN) 800 MG tablet Take 1 tablet (800 mg total) by mouth 3 (three) times daily.  Marland Kitchen tiZANidine (ZANAFLEX) 2 MG tablet Take 2 mg by mouth 3 (three) times daily as needed for muscle spasms.   Marland Kitchen venlafaxine XR (EFFEXOR-XR) 150 MG 24 hr capsule TAKE 1 CAPSULE BY MOUTH DAILY WITH BREAKFAST.   No facility-administered encounter medications on file as of 07/19/2019.     Allergies: Propranolol  Body mass index is 36.41 kg/m.  Height 5' 6.5" (1.689 m), weight 229 lb (103.9 kg), last menstrual period 07/16/2019. Review of Systems: General:   Denies fever, chills, unexplained weight loss.  Optho/Auditory:   Denies visual changes, blurred vision/LOV Respiratory:   Denies SOB, DOE more than baseline levels.  Cardiovascular:   Denies chest pain, palpitations, new onset peripheral edema  Gastrointestinal:   Denies nausea, vomiting, diarrhea.  Genitourinary: Denies dysuria, freq/ urgency, flank pain or discharge from genitals.  Endocrine:     Denies hot or cold intolerance, polyuria, polydipsia. Musculoskeletal:   Denies unexplained myalgias, joint swelling, unexplained arthralgias, gait problems.  Skin:  Denies  rash, suspicious lesions Neurological:     Denies dizziness, unexplained weakness, numbness  Psychiatric/Behavioral:   Denies mood changes, suicidal or homicidal ideations, hallucinations This patient does not have sx concerning for COVID-19 Infection (ie; fever, chills, cough, new or worsening shortness of breath).     Observations/Objective: No acute distress noted during the conversation  Assessment and Plan: Continue use of compression socks while awake Elevate lower extremities as often as possible Follow DASH diet Remain as active as possible   Follow Up Instructions: PRN   I discussed the assessment and treatment plan with the patient. The patient was provided an opportunity to ask questions and all were answered. The patient agreed with the plan and demonstrated an understanding of the instructions.   The patient was advised to call back or seek an in-person evaluation if the symptoms worsen or if the condition fails to improve as anticipated.  I provided 8 minutes of non-face-to-face time during this encounter.   Esaw Grandchild, NP

## 2019-07-19 ENCOUNTER — Encounter: Payer: Self-pay | Admitting: Adult Health

## 2019-07-19 ENCOUNTER — Other Ambulatory Visit: Payer: Self-pay

## 2019-07-19 ENCOUNTER — Ambulatory Visit (INDEPENDENT_AMBULATORY_CARE_PROVIDER_SITE_OTHER): Payer: BC Managed Care – PPO | Admitting: Adult Health

## 2019-07-19 DIAGNOSIS — R600 Localized edema: Secondary | ICD-10-CM

## 2019-07-19 NOTE — Assessment & Plan Note (Signed)
Assessment and Plan: She has completed 6 days course of HCTZ 12.5mg  QD >80% reduction of lower ext edema Continue use of compression socks while awake Elevate lower extremities as often as possible Follow DASH diet Remain as active as possible   Follow Up Instructions: PRN   I discussed the assessment and treatment plan with the patient. The patient was provided an opportunity to ask questions and all were answered. The patient agreed with the plan and demonstrated an understanding of the instructions.

## 2019-08-02 DIAGNOSIS — R1011 Right upper quadrant pain: Secondary | ICD-10-CM | POA: Diagnosis not present

## 2019-08-02 DIAGNOSIS — K760 Fatty (change of) liver, not elsewhere classified: Secondary | ICD-10-CM | POA: Diagnosis not present

## 2019-08-02 DIAGNOSIS — K828 Other specified diseases of gallbladder: Secondary | ICD-10-CM | POA: Diagnosis not present

## 2019-08-08 ENCOUNTER — Emergency Department (HOSPITAL_COMMUNITY)
Admission: EM | Admit: 2019-08-08 | Discharge: 2019-08-09 | Disposition: A | Discharge status: Home / Self Care | Payer: BC Managed Care – PPO | Attending: Emergency Medicine | Admitting: Emergency Medicine

## 2019-08-08 ENCOUNTER — Encounter (HOSPITAL_COMMUNITY): Payer: Self-pay

## 2019-08-08 DIAGNOSIS — Z79899 Other long term (current) drug therapy: Secondary | ICD-10-CM | POA: Insufficient documentation

## 2019-08-08 DIAGNOSIS — R109 Unspecified abdominal pain: Secondary | ICD-10-CM

## 2019-08-08 DIAGNOSIS — R1011 Right upper quadrant pain: Secondary | ICD-10-CM | POA: Diagnosis not present

## 2019-08-08 DIAGNOSIS — K828 Other specified diseases of gallbladder: Secondary | ICD-10-CM | POA: Diagnosis not present

## 2019-08-08 DIAGNOSIS — F17210 Nicotine dependence, cigarettes, uncomplicated: Secondary | ICD-10-CM | POA: Diagnosis not present

## 2019-08-08 DIAGNOSIS — K760 Fatty (change of) liver, not elsewhere classified: Secondary | ICD-10-CM | POA: Diagnosis not present

## 2019-08-08 NOTE — ED Triage Notes (Signed)
Pt complains of upper abdominal pain that radiates to her back, she states she has intermittent fevers and vomiting since then, pt states she was suppose to here from [place] about gallbladder surgery and she hasn't yet Pt is also a Retail banker at [employer]

## 2019-08-09 ENCOUNTER — Emergency Department (HOSPITAL_COMMUNITY): Payer: BC Managed Care – PPO

## 2019-08-09 DIAGNOSIS — K760 Fatty (change of) liver, not elsewhere classified: Secondary | ICD-10-CM | POA: Diagnosis not present

## 2019-08-09 LAB — COMPREHENSIVE METABOLIC PANEL
ALT: 38 U/L (ref 0–44)
AST: 32 U/L (ref 15–41)
Albumin: 4.5 g/dL (ref 3.5–5.0)
Alkaline Phosphatase: 63 U/L (ref 38–126)
Anion gap: 10 (ref 5–15)
BUN: 15 mg/dL (ref 6–20)
CO2: 20 mmol/L — ABNORMAL LOW (ref 22–32)
Calcium: 9.1 mg/dL (ref 8.9–10.3)
Chloride: 107 mmol/L (ref 98–111)
Creatinine, Ser: 0.91 mg/dL (ref 0.44–1.00)
GFR calc Af Amer: >60 mL/min (ref >60)
GFR calc non Af Amer: >60 mL/min (ref >60)
Glucose, Bld: 89 mg/dL (ref 70–99)
Potassium: 3.9 mmol/L (ref 3.5–5.1)
Sodium: 137 mmol/L (ref 135–145)
Total Bilirubin: 0.3 mg/dL (ref 0.3–1.2)
Total Protein: 7.5 g/dL (ref 6.5–8.1)

## 2019-08-09 LAB — CBC WITH DIFFERENTIAL/PLATELET
Abs Immature Granulocytes: 0.02 10*3/uL (ref 0.00–0.07)
Basophils Absolute: 0 10*3/uL (ref 0.0–0.1)
Basophils Relative: 0 %
Eosinophils Absolute: 0.2 10*3/uL (ref 0.0–0.5)
Eosinophils Relative: 3 %
HCT: 38.7 % (ref 36.0–46.0)
Hemoglobin: 13.2 g/dL (ref 12.0–15.0)
Immature Granulocytes: 0 %
Lymphocytes Relative: 40 %
Lymphs Abs: 2.9 10*3/uL (ref 0.7–4.0)
MCH: 31.8 pg (ref 26.0–34.0)
MCHC: 34.1 g/dL (ref 30.0–36.0)
MCV: 93.3 fL (ref 80.0–100.0)
Monocytes Absolute: 0.3 10*3/uL (ref 0.1–1.0)
Monocytes Relative: 5 %
Neutro Abs: 3.7 10*3/uL (ref 1.7–7.7)
Neutrophils Relative %: 52 %
Platelets: 280 10*3/uL (ref 150–400)
RBC: 4.15 MIL/uL (ref 3.87–5.11)
RDW: 12.5 % (ref 11.5–15.5)
WBC: 7.1 10*3/uL (ref 4.0–10.5)
nRBC: 0 % (ref 0.0–0.2)

## 2019-08-09 LAB — LIPASE, BLOOD: Lipase: 26 U/L (ref 11–51)

## 2019-08-09 MED ORDER — OXYCODONE-ACETAMINOPHEN 5-325 MG PO TABS: 1.0000 | ORAL_TABLET | Freq: Three times a day (TID) | ORAL | 0 refills | Status: DC | PRN

## 2019-08-09 MED ORDER — ONDANSETRON HCL 4 MG/2ML IJ SOLN
4.0000 mg | Freq: Once | INTRAMUSCULAR | Status: AC
  Administered 2019-08-09: 01:00:00 4 mg via INTRAVENOUS
  Filled 2019-08-09: qty 2

## 2019-08-09 MED ORDER — PROMETHAZINE HCL 25 MG/ML IJ SOLN
25.0000 mg | Freq: Once | INTRAMUSCULAR | Status: AC
  Administered 2019-08-09: 25 mg via INTRAVENOUS
  Filled 2019-08-09: qty 1

## 2019-08-09 MED ORDER — HYDROMORPHONE HCL 1 MG/ML IJ SOLN
1.0000 mg | Freq: Once | INTRAMUSCULAR | Status: AC
  Administered 2019-08-09: 01:00:00 1 mg via INTRAVENOUS
  Filled 2019-08-09: qty 1

## 2019-08-09 MED ORDER — PROMETHAZINE HCL 25 MG PO TABS: 25.0000 mg | ORAL_TABLET | Freq: Four times a day (QID) | ORAL | 0 refills | Status: DC | PRN

## 2019-08-09 NOTE — Discharge Instructions (Addendum)
Thank you for allowing me to care for you today in the Emergency Department.   You can take 600 mg of ibuprofen with food or 6 and 50 mg of Tylenol once every 6 hours for pain control.  For severe, uncontrollable pain, you can take 1 tablet of Percocet every 8 hours as needed.  Do not work or drive while taking this medication because it causes you to be impaired.  Is a narcotic.  It can be addicting.  You can take 1 tablet of Phenergan every 6 hours as needed for nausea or vomiting.  Call to schedule follow-up appoint with central [place] [facility].  Return to the emergency department if you develop persistent vomiting despite taking Phenergan, uncontrollable pain, if you check your temperature at home and have a fever (greater than 100.5 F) despite taking Tylenol and ibuprofen, or other new, concerning symptoms.

## 2019-08-09 NOTE — ED Notes (Signed)
Pt given water for PO challenge.  Per Mia McDonald, PA-C no urine sample is needed.

## 2019-08-09 NOTE — ED Provider Notes (Signed)
[facility]-EMERGENCY DEPT Provider Note   CSN: [id number] Arrival date & time: 08/08/19  2139     History   Chief Complaint Chief Complaint  Patient presents with  . Abdominal Pain    HPI Carrie Reed is a 33 y.o. female with a history of biliary dyskinesia who presents to the emergency department with a chief complaint of right upper quadrant abdominal pain, anorexia, nausea, vomiting, subjective fever, and chills.  The patient endorses subjective fever and chills, onset last night.  She states that she took 800 mg of ibuprofen at approximately 21:30 and a dose yesterday.  She felt that fever and chills improved after taking ibuprofen.  She did not measure her fever at home.  She reports that she is followed by Dr. Loreta Ave with GI and had a HIDA scan a couple of years ago that was unremarkable.  She reports that she was diagnosed with biliary dyskinesia and is continued to have intermittent pain.  She states that she tries to manage her symptoms with diet, but recently her right upper quadrant pain has been worsening.  When she saw Dr. Loreta Ave last week, she was given a referral to [facility] [place] [facility] with Dr. Magnus Ivan, but has not yet received a call about scheduling an appointment.  She reports that she had a call out of work tonight due to feeling feverish in addition to not feeling as if she cannot work in the amount of pain that she has been having in her right upper quadrant.       The history is provided by the patient. No language interpreter was used.    Past Medical History:  Diagnosis Date  . Bulging of cervical intervertebral disc   . Degenerative disc disease, lumbar   . Migraine     Patient Active Problem List   Diagnosis Date Noted  . Lower extremity edema 07/05/2019  . Degenerative disc disease, lumbar 12/24/2017  . Bulging of cervical intervertebral disc 12/24/2017  . Acute maxillary sinusitis 11/11/2017  . Cough 11/11/2017  .  Chronic RUQ pain 08/12/2017  . Healthcare maintenance 08/12/2017  . Intractable chronic migraine without aura and without status migrainosus 04/27/2016  . Migraine 08/02/2015  . Pulsatile tinnitus of left ear 08/02/2015  . Blurry vision 08/02/2015  . Worsening headaches 08/02/2015    Past Surgical History:  Procedure Laterality Date  . APPENDECTOMY    . TONSILLECTOMY AND ADENOIDECTOMY     x2  . TUBAL LIGATION    . TYMPANOSTOMY TUBE PLACEMENT    . UTERINE TEAR     d/t late stage abortion     OB History   No obstetric history on file.      Home Medications    Prior to Admission medications   Medication Sig Start Date End Date Taking? Authorizing Provider  ibuprofen (ADVIL,MOTRIN) 800 MG tablet Take 1 tablet (800 mg total) by mouth 3 (three) times daily. 07/30/18  Yes Charlynne Pander, MD  venlafaxine XR (EFFEXOR-XR) 150 MG 24 hr capsule TAKE 1 CAPSULE BY MOUTH DAILY WITH BREAKFAST. Patient taking differently: Take 150 mg by mouth daily with breakfast.  12/25/16  Yes Anson Fret, MD  oxyCODONE-acetaminophen (PERCOCET/ROXICET) 5-325 MG tablet Take 1 tablet by mouth every 8 (eight) hours as needed for severe pain. 08/09/19   Merelin Human A, PA-C  promethazine (PHENERGAN) 25 MG tablet Take 1 tablet (25 mg total) by mouth every 6 (six) hours as needed for nausea or vomiting. 08/09/19   Foch Rosenwald,  Taylor Levick A, PA-C    Family History Family History  Problem Relation Age of Onset  . Cirrhosis Mother   . Kidney disease Mother   . Diabetes Mother   . Stroke Mother   . Clotting disorder Mother   . Heart failure Father   . Heart disease Father   . Heart attack Father   . Diabetes Father   . Hyperlipidemia Father   . Cancer Sister        cervical cancer  . Hypertension Brother   . Clotting disorder Brother   . High blood pressure Brother   . Cervical cancer Sister   . Suicidality Maternal Uncle   . Suicidality Paternal Aunt   . Diabetes Paternal Grandmother   . Cancer Paternal  Grandfather        throat  . Suicidality Cousin   . Migraines Neg Hx     Social History Social History   Tobacco Use  . Smoking status: Current Every Day Smoker    Packs/day: 0.50    Years: 12.00    Pack years: 6.00    Types: Cigarettes  . Smokeless tobacco: Never Used  Substance Use Topics  . Alcohol use: Yes    Alcohol/week: 0.0 standard drinks    Comment: social  . Drug use: No     Allergies   Propranolol   Review of Systems Review of Systems  Constitutional: Positive for chills and fever. Negative for activity change.  Respiratory: Negative for shortness of breath.   Cardiovascular: Negative for chest pain.  Gastrointestinal: Positive for abdominal pain, nausea and vomiting. Negative for anal bleeding, blood in stool, constipation and diarrhea.  Genitourinary: Negative for dysuria, flank pain, frequency, pelvic pain, urgency, vaginal bleeding and vaginal discharge.  Musculoskeletal: Negative for back pain.  Skin: Negative for rash.  Allergic/Immunologic: Negative for immunocompromised state.  Neurological: Negative for seizures, syncope, weakness, numbness and headaches.  Psychiatric/Behavioral: Negative for confusion.   Physical Exam Updated Vital Signs BP (!) 162/110 (BP Location: Left Arm)   Pulse (!) 101   Temp 98.8 F (37.1 C) (Oral)   Resp 16   LMP 07/16/2019 (Exact Date)   SpO2 97%   Physical Exam Vitals signs and nursing note reviewed.  Constitutional:      General: She is not in acute distress.    Comments: Appears uncomfortable   HENT:     Head: Normocephalic.  Eyes:     Conjunctiva/sclera: Conjunctivae normal.  Neck:     Musculoskeletal: Neck supple.  Cardiovascular:     Rate and Rhythm: Normal rate and regular rhythm.     Heart sounds: No murmur. No friction rub. No gallop.   Pulmonary:     Effort: Pulmonary effort is normal. No respiratory distress.     Breath sounds: No stridor. No wheezing, rhonchi or rales.  Chest:     Chest  wall: No tenderness.  Abdominal:     General: There is no distension.     Palpations: Abdomen is soft. There is no mass.     Tenderness: There is abdominal tenderness. There is guarding. There is no right CVA tenderness, left CVA tenderness or rebound.     Hernia: No hernia is present.     Comments: Tender palpation in the epigastric and right upper quadrant.  She has a positive Murphy sign.  Abdomen is soft and nondistended.  No tenderness over McBurney's point.  No CVA tenderness bilaterally.  Normoactive bowel sounds.  Skin:    General: Skin is  warm.     Findings: No rash.  Neurological:     Mental Status: She is alert.  Psychiatric:        Behavior: Behavior normal.      ED Treatments / Results  Labs (all labs ordered are listed, but only abnormal results are displayed) Labs Reviewed  COMPREHENSIVE METABOLIC PANEL - Abnormal; Notable for the following components:      Result Value   CO2 20 (*)    All other components within normal limits  CBC WITH DIFFERENTIAL/PLATELET  LIPASE, BLOOD    EKG None  Radiology [place] Abdomen Limited Ruq  Result Date: 08/09/2019 CLINICAL DATA:  Right upper quadrant pain for 2 months EXAM: ULTRASOUND ABDOMEN LIMITED RIGHT UPPER QUADRANT COMPARISON:  Ultrasound August 06, 2017 FINDINGS: Gallbladder: No echogenic, shadowing gallstones or wall thickening visualized. 3 mm echogenic mural focus with associated ring down artifact compatible with adenomyomatosis. No sonographic Murphy sign noted by sonographer. Common bile duct: Diameter: 4.2 mm, nondilated Liver: Diffusely increased hepatic echogenicity with loss of definition of the portal triads compatible with hepatic steatosis. Portal vein is patent on color Doppler imaging with normal direction of blood flow towards the liver. Other: None. IMPRESSION: Hepatic steatosis. Gallbladder adenomyomatosis. Electronically Signed   By: Kreg ShropshirePrice  DeHay M.D.   On: 08/09/2019 01:16    Procedures Procedures (including  critical care time)  Medications Ordered in ED Medications  HYDROmorphone (DILAUDID) injection 1 mg (1 mg Intravenous Given 08/09/19 0042)  ondansetron (ZOFRAN) injection 4 mg (4 mg Intravenous Given 08/09/19 0042)  promethazine (PHENERGAN) injection 25 mg (25 mg Intravenous Given 08/09/19 0251)     Initial Impression / Assessment and Plan / ED Course  I have reviewed the triage vital signs and the nursing notes.  Pertinent labs & imaging results that were available during my care of the patient were reviewed by me and considered in my medical decision making (see chart for details).        33 year old female with history of biliary dyskinesia presenting with complaints of subjective fever and chills since yesterday accompanied by nausea, vomiting, and worse than a right upper quadrant pain.  She is awaiting a referral to general surgery from her gastroenterologist.  Afebrile in the ER.  Vital signs are otherwise unremarkable.  On exam, she is tender to palpation in the right upper quadrant with a positive Murphy sign.  Given concern for constitutional symptoms, right upper quadrant ultrasound was ordered, which demonstrated gallbladder adenomyomatosis.  Labs were grossly unremarkable, specifically she had no leukocytosis or left shift.  Pain was well-controlled with 1 dose of Dilaudid in the ER and initially Zofran improved her nausea, but she felt much better after dose of Phenergan.  Emergent surgical consult is not indicated at this time.  Since pain is controlled, we will discharge the patient with a short course of pain medication with outpatient follow-up to general surgery.  Patient is agreeable to plan at this time. A 354-month prescription history query was performed using the [place] CSRS prior to discharge.  ER return precautions given.  She was advised to measure her temperature at home.  She is hemodynamically stable and in no acute distress.  Safe for discharge home with outpatient  follow-up.   Final Clinical Impressions(s) / ED Diagnoses   Final diagnoses:  Abdominal pain  Adenomyosis of gallbladder    ED Discharge Orders         Ordered    oxyCODONE-acetaminophen (PERCOCET/ROXICET) 5-325 MG tablet  Every 8 hours  PRN     08/09/19 0341    promethazine (PHENERGAN) 25 MG tablet  Every 6 hours PRN     08/09/19 0341           Barkley BoardsMcDonald, Kalid Ghan A, PA-C 08/09/19 [id number]    Marily MemosMesner, Jason, MD 08/09/19 2342

## 2019-08-19 ENCOUNTER — Ambulatory Visit: Payer: Self-pay | Admitting: Surgery

## 2019-08-19 DIAGNOSIS — K8050 Calculus of bile duct without cholangitis or cholecystitis without obstruction: Secondary | ICD-10-CM | POA: Diagnosis not present

## 2019-08-19 NOTE — H&P (View-Only) (Signed)
Surgical H&P  CC: abdominal pain  HPI:  Otherwise healthy 33 year old woman with 2 year history of biliary colic symptoms. He had escalated in the last few months, to the point that she is experiencing daily epigastric and right upper quadrant pain associated with nausea and vomiting. Significant decreased appetite and has lost a fair amount of weight due to this. She is a patient of Dr. 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 She presented to the emergency room last week with significant symptoms requiring IV medications for pain control and nausea control. She has previously had an appendectomy and a tubal ligation but no other abdominal surgeries. 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  Allergies  Allergen Reactions  . Propranolol Palpitations    Past Medical History:  Diagnosis Date  . Bulging of cervical intervertebral disc   . Degenerative disc disease, lumbar   . Migraine     Past Surgical History:  Procedure Laterality Date  . APPENDECTOMY    . TONSILLECTOMY AND ADENOIDECTOMY     x2  . TUBAL LIGATION    . TYMPANOSTOMY TUBE PLACEMENT    . UTERINE TEAR     d/t late stage abortion    Family History  Problem Relation Age of Onset  . Cirrhosis Mother   . Kidney disease Mother   . Diabetes Mother   . Stroke Mother   . Clotting disorder Mother   . Heart failure Father   . Heart disease Father   . Heart attack Father   . Diabetes Father   . Hyperlipidemia Father   . Cancer Sister        cervical cancer  . Hypertension Brother   . Clotting disorder Brother   . High blood pressure Brother   . Cervical cancer Sister    . Suicidality Maternal Uncle   . Suicidality Paternal Aunt   . Diabetes Paternal Grandmother   . Cancer Paternal Grandfather        throat  . Suicidality Cousin   . Migraines Neg Hx     Social History   Socioeconomic History  . Marital status: Married    Spouse name: Jenny Reichmann  . Number of children: 4  . Years of education: Associates  . Highest education level: Not on file  Occupational History  . Occupation: [employer]  . Financial resource strain: Not on file  . Food insecurity    Worry: Not on file    Inability: Not on file  . Transportation needs    Medical: Not on file    Non-medical: Not on file  Tobacco Use  . Smoking status: Current Every Day Smoker    Packs/day: 0.50    Years: 12.00    Pack years: 6.00    Types: Cigarettes  . Smokeless tobacco: Never Used  Substance and Sexual Activity  . Alcohol use: Yes    Alcohol/week: 0.0 standard drinks    Comment: social  . Drug use: No  . Sexual activity: Yes  Lifestyle  . Physical activity    Days per week: Not on file    Minutes per session: Not on file  .  Stress: Not on file  Relationships  . Social Musicianconnections    Talks on phone: Not on file    Gets together: Not on file    Attends religious service: Not on file    Active member of club or organization: Not on file    Attends meetings of clubs or organizations: Not on file    Relationship status: Not on file  Other Topics Concern  . Not on file  Social History Narrative   Lives at home with husband and 4 children.   Caffeine use:  Drinks coffee (1/2 pot-1 pot every day)    Right-handed    Current Outpatient Medications on File Prior to Visit  Medication Sig Dispense Refill  . ibuprofen (ADVIL,MOTRIN) 800 MG tablet Take 1 tablet (800 mg total) by mouth 3 (three) times daily. 21 tablet 0  . oxyCODONE-acetaminophen (PERCOCET/ROXICET) 5-325 MG tablet Take 1 tablet by mouth every 8 (eight) hours as needed for severe pain. 8 tablet 0  .  promethazine (PHENERGAN) 25 MG tablet Take 1 tablet (25 mg total) by mouth every 6 (six) hours as needed for nausea or vomiting. 30 tablet 0  . venlafaxine XR (EFFEXOR-XR) 150 MG 24 hr capsule TAKE 1 CAPSULE BY MOUTH DAILY WITH BREAKFAST. (Patient taking differently: Take 150 mg by mouth daily with breakfast. ) 30 capsule 5   No current facility-administered medications on file prior to visit.     Review of Systems: General Not Present- Appetite Loss, Chills, Fatigue, Fever, Night Sweats, Weight Gain and Weight Loss. Skin Not Present- Change in Wart/Mole, Dryness, Hives, Jaundice, New Lesions, Non-Healing Wounds, Rash and Ulcer. HEENT Not Present- Earache, Hearing Loss, Hoarseness, Nose Bleed, Oral Ulcers, Ringing in the Ears, Seasonal Allergies, Sinus Pain, Sore Throat, Visual Disturbances, Wears glasses/contact lenses and Yellow Eyes. Respiratory Not Present- Bloody sputum, Chronic Cough, Difficulty Breathing, Snoring and Wheezing. Breast Not Present- Breast Mass, Breast Pain, Nipple Discharge and Skin Changes. Cardiovascular Not Present- Chest Pain, Difficulty Breathing Lying Down, Leg Cramps, Palpitations, Rapid Heart Rate, Shortness of Breath and Swelling of Extremities. Gastrointestinal Present- Abdominal Pain, Gets full quickly at meals and Nausea. Not Present- Bloating, Bloody Stool, Change in Bowel Habits, Chronic diarrhea, Constipation, Difficulty Swallowing, Excessive gas, Hemorrhoids, Indigestion, Rectal Pain and Vomiting. Female Genitourinary Not Present- Frequency, Nocturia, Painful Urination, Pelvic Pain and Urgency. Neurological Present- Headaches. Not Present- Decreased Memory, Fainting, Numbness, Seizures, Tingling, Tremor, Trouble walking and Weakness. Psychiatric Not Present- Anxiety, Bipolar, Change in Sleep Pattern, Depression, Fearful and Frequent crying. Endocrine Not Present- Cold Intolerance, Excessive Hunger, Hair Changes, Heat Intolerance, Hot flashes and New  Diabetes. Hematology Not Present- Blood Thinners, Easy Bruising, Excessive bleeding, Gland problems, HIV and Persistent Infections.  Physical Exam: Vitals (Armen Ferguson CMA; 08/19/2019 10:10 AM) 08/19/2019 10:10 AM Weight: 221.5 lb Height: 66in Body Surface Area: 2.09 m Body Mass Index: 35.75 kg/m  Temp.: 97.46F  Pulse: 79 (Regular)  P.OX: 98% (Room air) BP: 128/98 (Sitting, Left Arm, Standard)  Gen: alert , appears fatigued Eye: extraocular motion intact, no scleral icterus ENT: moist mucus membranes, dentition intact Neck: no mass or thyromegaly Chest: unlabored respirations, symmetrical air entry, clear bilaterally CV: regular rate and mildly tender in epigastrium and right subcostal margin, nondistended. No mass or organomegaly MSK: strength symmetrical throughout, no deformity Neuro: grossly intact, normal gait Psych: normal mood and affect, appropriate insight Skin: warm and dry, no rash or lesion on limited exam   CBC Latest Ref Rng & Units 08/08/2019 07/05/2019 07/30/2018  WBC 4.0 -  10.5 K/uL 7.1 6.6 6.2  Hemoglobin 12.0 - 15.0 g/dL 34.7 42.5 95.6  Hematocrit 36.0 - 46.0 % 38.7 37.8 37.5  Platelets 150 - 400 K/uL 280 283 249    CMP Latest Ref Rng & Units 08/08/2019 07/05/2019 07/30/2018  Glucose 70 - 99 mg/dL 89 85 95  BUN 6 - 20 mg/dL 15 7 12   Creatinine 0.44 - 1.00 mg/dL 3.87 5.64 3.32  Sodium 135 - 145 mmol/L 137 136 138  Potassium 3.5 - 5.1 mmol/L 3.9 3.8 3.9  Chloride 98 - 111 mmol/L 107 103 106  CO2 22 - 32 mmol/L 20(L) 24 24  Calcium 8.9 - 10.3 mg/dL 9.1 9.4 9.1  Total Protein 6.5 - 8.1 g/dL 7.5 - 6.4(L)  Total Bilirubin 0.3 - 1.2 mg/dL 0.3 - 0.7  Alkaline Phos 38 - 126 U/L 63 - 60  AST 15 - 41 U/L 32 - 24  ALT 0 - 44 U/L 38 - 24    No results found for: INR, PROTIME  Imaging: No results found.   A/P: BILIARY COLIC (K80.50) Story: Although she does not have stones on imaging she does have a low gallbladder ejection fraction and gallbladder  adenomyomatosis and her symptoms are classic. I recommend proceeding with laparoscopic cholecystectomy with possible cholangiogram. Discussed risks of surgery including bleeding, pain, scarring, intraabdominal injury specifically to the common bile duct and sequelae, bile leak, conversion to open surgery, failure to resolve symptoms, blood clots/ pulmonary embolus, heart attack, pneumonia, stroke, death. Questions welcomed and answered to patient's satisfaction. We will schedule her as soon as possible.   Phylliss Blakes, MD [facility], [place] Pager [phone]

## 2019-08-19 NOTE — H&P (Signed)
Surgical H&P  CC: abdominal pain  HPI:  Otherwise healthy 33 year old woman with 2 year history of biliary colic symptoms. He had escalated in the last few months, to the point that she is experiencing daily epigastric and right upper quadrant pain associated with nausea and vomiting. Significant decreased appetite and has lost a fair amount of weight due to this. She is a patient of Dr. 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 She presented to the emergency room last week with significant symptoms requiring IV medications for pain control and nausea control. She has previously had an appendectomy and a tubal ligation but no other abdominal surgeries. 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  Allergies  Allergen Reactions  . Propranolol Palpitations    Past Medical History:  Diagnosis Date  . Bulging of cervical intervertebral disc   . Degenerative disc disease, lumbar   . Migraine     Past Surgical History:  Procedure Laterality Date  . APPENDECTOMY    . TONSILLECTOMY AND ADENOIDECTOMY     x2  . TUBAL LIGATION    . TYMPANOSTOMY TUBE PLACEMENT    . UTERINE TEAR     d/t late stage abortion    Family History  Problem Relation Age of Onset  . Cirrhosis Mother   . Kidney disease Mother   . Diabetes Mother   . Stroke Mother   . Clotting disorder Mother   . Heart failure Father   . Heart disease Father   . Heart attack Father   . Diabetes Father   . Hyperlipidemia Father   . Cancer Sister        cervical cancer  . Hypertension Brother   . Clotting disorder Brother   . High blood pressure Brother   . Cervical cancer Sister    . Suicidality Maternal Uncle   . Suicidality Paternal Aunt   . Diabetes Paternal Grandmother   . Cancer Paternal Grandfather        throat  . Suicidality Cousin   . Migraines Neg Hx     Social History   Socioeconomic History  . Marital status: Married    Spouse name: Jenny Reichmann  . Number of children: 4  . Years of education: Associates  . Highest education level: Not on file  Occupational History  . Occupation: [employer]  . Financial resource strain: Not on file  . Food insecurity    Worry: Not on file    Inability: Not on file  . Transportation needs    Medical: Not on file    Non-medical: Not on file  Tobacco Use  . Smoking status: Current Every Day Smoker    Packs/day: 0.50    Years: 12.00    Pack years: 6.00    Types: Cigarettes  . Smokeless tobacco: Never Used  Substance and Sexual Activity  . Alcohol use: Yes    Alcohol/week: 0.0 standard drinks    Comment: social  . Drug use: No  . Sexual activity: Yes  Lifestyle  . Physical activity    Days per week: Not on file    Minutes per session: Not on file  .  Stress: Not on file  Relationships  . Social Musicianconnections    Talks on phone: Not on file    Gets together: Not on file    Attends religious service: Not on file    Active member of club or organization: Not on file    Attends meetings of clubs or organizations: Not on file    Relationship status: Not on file  Other Topics Concern  . Not on file  Social History Narrative   Lives at home with husband and 4 children.   Caffeine use:  Drinks coffee (1/2 pot-1 pot every day)    Right-handed    Current Outpatient Medications on File Prior to Visit  Medication Sig Dispense Refill  . ibuprofen (ADVIL,MOTRIN) 800 MG tablet Take 1 tablet (800 mg total) by mouth 3 (three) times daily. 21 tablet 0  . oxyCODONE-acetaminophen (PERCOCET/ROXICET) 5-325 MG tablet Take 1 tablet by mouth every 8 (eight) hours as needed for severe pain. 8 tablet 0  .  promethazine (PHENERGAN) 25 MG tablet Take 1 tablet (25 mg total) by mouth every 6 (six) hours as needed for nausea or vomiting. 30 tablet 0  . venlafaxine XR (EFFEXOR-XR) 150 MG 24 hr capsule TAKE 1 CAPSULE BY MOUTH DAILY WITH BREAKFAST. (Patient taking differently: Take 150 mg by mouth daily with breakfast. ) 30 capsule 5   No current facility-administered medications on file prior to visit.     Review of Systems: General Not Present- Appetite Loss, Chills, Fatigue, Fever, Night Sweats, Weight Gain and Weight Loss. Skin Not Present- Change in Wart/Mole, Dryness, Hives, Jaundice, New Lesions, Non-Healing Wounds, Rash and Ulcer. HEENT Not Present- Earache, Hearing Loss, Hoarseness, Nose Bleed, Oral Ulcers, Ringing in the Ears, Seasonal Allergies, Sinus Pain, Sore Throat, Visual Disturbances, Wears glasses/contact lenses and Yellow Eyes. Respiratory Not Present- Bloody sputum, Chronic Cough, Difficulty Breathing, Snoring and Wheezing. Breast Not Present- Breast Mass, Breast Pain, Nipple Discharge and Skin Changes. Cardiovascular Not Present- Chest Pain, Difficulty Breathing Lying Down, Leg Cramps, Palpitations, Rapid Heart Rate, Shortness of Breath and Swelling of Extremities. Gastrointestinal Present- Abdominal Pain, Gets full quickly at meals and Nausea. Not Present- Bloating, Bloody Stool, Change in Bowel Habits, Chronic diarrhea, Constipation, Difficulty Swallowing, Excessive gas, Hemorrhoids, Indigestion, Rectal Pain and Vomiting. Female Genitourinary Not Present- Frequency, Nocturia, Painful Urination, Pelvic Pain and Urgency. Neurological Present- Headaches. Not Present- Decreased Memory, Fainting, Numbness, Seizures, Tingling, Tremor, Trouble walking and Weakness. Psychiatric Not Present- Anxiety, Bipolar, Change in Sleep Pattern, Depression, Fearful and Frequent crying. Endocrine Not Present- Cold Intolerance, Excessive Hunger, Hair Changes, Heat Intolerance, Hot flashes and New  Diabetes. Hematology Not Present- Blood Thinners, Easy Bruising, Excessive bleeding, Gland problems, HIV and Persistent Infections.  Physical Exam: Vitals (Armen Ferguson CMA; 08/19/2019 10:10 AM) 08/19/2019 10:10 AM Weight: 221.5 lb Height: 66in Body Surface Area: 2.09 m Body Mass Index: 35.75 kg/m  Temp.: 97.46F  Pulse: 79 (Regular)  P.OX: 98% (Room air) BP: 128/98 (Sitting, Left Arm, Standard)  Gen: alert , appears fatigued Eye: extraocular motion intact, no scleral icterus ENT: moist mucus membranes, dentition intact Neck: no mass or thyromegaly Chest: unlabored respirations, symmetrical air entry, clear bilaterally CV: regular rate and mildly tender in epigastrium and right subcostal margin, nondistended. No mass or organomegaly MSK: strength symmetrical throughout, no deformity Neuro: grossly intact, normal gait Psych: normal mood and affect, appropriate insight Skin: warm and dry, no rash or lesion on limited exam   CBC Latest Ref Rng & Units 08/08/2019 07/05/2019 07/30/2018  WBC 4.0 -  10.5 K/uL 7.1 6.6 6.2  Hemoglobin 12.0 - 15.0 g/dL 13.2 12.8 12.5  Hematocrit 36.0 - 46.0 % 38.7 37.8 37.5  Platelets 150 - 400 K/uL 280 283 249    CMP Latest Ref Rng & Units 08/08/2019 07/05/2019 07/30/2018  Glucose 70 - 99 mg/dL 89 85 95  BUN 6 - 20 mg/dL 15 7 12  Creatinine 0.44 - 1.00 mg/dL 0.91 0.82 0.81  Sodium 135 - 145 mmol/L 137 136 138  Potassium 3.5 - 5.1 mmol/L 3.9 3.8 3.9  Chloride 98 - 111 mmol/L 107 103 106  CO2 22 - 32 mmol/L 20(L) 24 24  Calcium 8.9 - 10.3 mg/dL 9.1 9.4 9.1  Total Protein 6.5 - 8.1 g/dL 7.5 - 6.4(L)  Total Bilirubin 0.3 - 1.2 mg/dL 0.3 - 0.7  Alkaline Phos 38 - 126 U/L 63 - 60  AST 15 - 41 U/L 32 - 24  ALT 0 - 44 U/L 38 - 24    No results found for: INR, PROTIME  Imaging: No results found.   A/P: BILIARY COLIC (K80.50) Story: Although she does not have stones on imaging she does have a low gallbladder ejection fraction and gallbladder  adenomyomatosis and her symptoms are classic. I recommend proceeding with laparoscopic cholecystectomy with possible cholangiogram. Discussed risks of surgery including bleeding, pain, scarring, intraabdominal injury specifically to the common bile duct and sequelae, bile leak, conversion to open surgery, failure to resolve symptoms, blood clots/ pulmonary embolus, heart attack, pneumonia, stroke, death. Questions welcomed and answered to patient's satisfaction. We will schedule her as soon as possible.   Melanie Openshaw, MD [facility] Pager [phone]   

## 2019-08-24 NOTE — Patient Instructions (Addendum)
DUE TO COVID-19 ONLY ONE VISITOR IS ALLOWED TO COME WITH YOU AND STAY IN THE WAITING ROOM ONLY DURING PRE OP AND PROCEDURE DAY OF SURGERY. THE 1 VISITOR MAY VISIT WITH YOU AFTER SURGERY IN YOUR PRIVATE ROOM DURING VISITING HOURS ONLY!  YOU NEED TO HAVE A COVID 19 TEST ON. THIS TEST MUST BE DONE BEFORE SURGERY, COME  [address], [place] Carrie Reed , [phone].  [facility]) ONCE YOUR COVID TEST IS COMPLETED, PLEASE BEGIN THE QUARANTINE INSTRUCTIONS AS OUTLINED IN YOUR HANDOUT.                Carrie CollardJamie Reed  08/24/2019   Your procedure is scheduled on: 08-29-19    [facility]    Report to Short Stay at 5:30 AM     Call this number if you have problems the morning of surgery [phone]    Remember: Do not eat food or drink liquids :After Midnight.      Take these medicines the morning of surgery with A SIP OF WATER: Venlafaxine XR  BRUSH YOUR TEETH MORNING OF SURGERY AND RINSE YOUR MOUTH OUT, NO CHEWING GUM CANDY OR MINTS.                                 You may not have any metal on your body including hair pins and              piercings    Do not wear jewelry, make-up, lotions, powders or perfumes, deodorant              Do not wear nail polish.  Do not shave  48 hours prior to surgery.               Do not bring valuables to the hospital. [place] IS NOT             RESPONSIBLE   FOR VALUABLES.  Contacts, dentures or bridgework may not be worn into surgery.  Leave suitcase in the car. After surgery it may be brought to your room.     Patients discharged the day of surgery will not be allowed to drive home. IF YOU ARE HAVING SURGERY AND GOING HOME THE SAME DAY, YOU MUST HAVE AN ADULT TO DRIVE YOU HOME AND BE WITH YOU FOR 24 HOURS. YOU MAY GO HOME BY TAXI OR UBER OR ORTHERWISE, BUT AN ADULT MUST ACCOMPANY YOU HOME AND STAY WITH YOU FOR 24 HOURS.  Name and phone number of your driver: Carrie LabellaJohn Reed [phone]  Special  Instructions: N/A              Please read over the following fact sheets you were given: _____________________________________________________________________             [facility] - Preparing for [facility] Before surgery, you can play an important role.  Because skin is not sterile, your skin needs to be as free of germs as possible.  You can reduce the number of germs on your skin by washing with CHG (chlorahexidine gluconate) soap before surgery.  CHG is an antiseptic cleaner which kills germs and bonds with the skin to continue killing germs even after washing. Please DO NOT use if you have an allergy to CHG or antibacterial soaps.  If your skin becomes reddened/irritated stop using the CHG and inform your nurse when you arrive at Short Stay. Do  not shave (including legs and underarms) for at least 48 hours prior to the first CHG shower.  You may shave your face/neck. Please follow these instructions carefully:  1.  Shower with CHG Soap the night before surgery and the  morning of Surgery.  2.  If you choose to wash your hair, wash your hair first as usual with your  normal  shampoo.  3.  After you shampoo, rinse your hair and body thoroughly to remove the  shampoo.                           4.  Use CHG as you would any other liquid soap.  You can apply chg directly  to the skin and wash                       Gently with a scrungie or clean washcloth.  5.  Apply the CHG Soap to your body ONLY FROM THE NECK DOWN.   Do not use on face/ open                           Wound or open sores. Avoid contact with eyes, ears mouth and genitals (private parts).                       Wash face,  Genitals (private parts) with your normal soap.             6.  Wash thoroughly, paying special attention to the area where your surgery  will be performed.  7.  Thoroughly rinse your body with warm water from the neck down.  8.  DO NOT shower/wash with your normal soap after using and rinsing off  the CHG  Soap.                9.  Pat yourself dry with a clean towel.            10.  Wear clean pajamas.            11.  Place clean sheets on your bed the night of your first shower and do not  sleep with pets. Day of Surgery : Do not apply any lotions/deodorants the morning of surgery.  Please wear clean clothes to the hospital/surgery center.  FAILURE TO FOLLOW THESE INSTRUCTIONS MAY RESULT IN THE CANCELLATION OF YOUR SURGERY PATIENT SIGNATURE_________________________________  NURSE SIGNATURE__________________________________  ________________________________________________________________________

## 2019-08-25 ENCOUNTER — Encounter (HOSPITAL_COMMUNITY): Payer: Self-pay

## 2019-08-25 ENCOUNTER — Other Ambulatory Visit (HOSPITAL_COMMUNITY)
Admission: RE | Admit: 2019-08-25 | Discharge: 2019-08-25 | Disposition: A | Discharge status: Home / Self Care | Payer: BC Managed Care – PPO | Source: Ambulatory Visit | Attending: Surgery | Admitting: Surgery

## 2019-08-25 ENCOUNTER — Other Ambulatory Visit (HOSPITAL_COMMUNITY): Payer: BC Managed Care – PPO

## 2019-08-25 ENCOUNTER — Other Ambulatory Visit: Payer: Self-pay

## 2019-08-25 ENCOUNTER — Encounter (HOSPITAL_COMMUNITY)
Admission: RE | Admit: 2019-08-25 | Discharge: 2019-08-25 | Disposition: A | Discharge status: Home / Self Care | Payer: BC Managed Care – PPO | Source: Ambulatory Visit | Attending: Surgery | Admitting: Surgery

## 2019-08-25 DIAGNOSIS — Z01812 Encounter for preprocedural laboratory examination: Secondary | ICD-10-CM | POA: Insufficient documentation

## 2019-08-25 DIAGNOSIS — Z20828 Contact with and (suspected) exposure to other viral communicable diseases: Secondary | ICD-10-CM | POA: Diagnosis not present

## 2019-08-25 DIAGNOSIS — K8050 Calculus of bile duct without cholangitis or cholecystitis without obstruction: Secondary | ICD-10-CM | POA: Insufficient documentation

## 2019-08-25 LAB — CBC
HCT: 39.6 % (ref 36.0–46.0)
Hemoglobin: 13.2 g/dL (ref 12.0–15.0)
MCH: 31.5 pg (ref 26.0–34.0)
MCHC: 33.3 g/dL (ref 30.0–36.0)
MCV: 94.5 fL (ref 80.0–100.0)
Platelets: 296 10*3/uL (ref 150–400)
RBC: 4.19 MIL/uL (ref 3.87–5.11)
RDW: 12.1 % (ref 11.5–15.5)
WBC: 7.4 10*3/uL (ref 4.0–10.5)
nRBC: 0 % (ref 0.0–0.2)

## 2019-08-26 LAB — NOVEL CORONAVIRUS, NAA (HOSP ORDER, SEND-OUT TO REF LAB; TAT 18-24 HRS): SARS-CoV-2, NAA: NOT DETECTED

## 2019-08-28 MED ORDER — BUPIVACAINE LIPOSOME 1.3 % IJ SUSP
20.0000 mL | Freq: Once | INTRAMUSCULAR | Status: DC
  Filled 2019-08-28: qty 20

## 2019-08-29 ENCOUNTER — Encounter (HOSPITAL_COMMUNITY): Payer: Self-pay | Admitting: *Deleted

## 2019-08-29 ENCOUNTER — Ambulatory Visit (HOSPITAL_COMMUNITY): Payer: BC Managed Care – PPO | Admitting: Certified Registered"

## 2019-08-29 ENCOUNTER — Encounter (HOSPITAL_COMMUNITY)
Admission: RE | Disposition: A | Discharge status: Home / Self Care | Payer: Self-pay | Source: Home / Self Care | Attending: Surgery

## 2019-08-29 ENCOUNTER — Ambulatory Visit (HOSPITAL_COMMUNITY)
Admission: RE | Admit: 2019-08-29 | Discharge: 2019-08-29 | Disposition: A | Discharge status: Home / Self Care | Payer: BC Managed Care – PPO | Attending: Surgery | Admitting: Surgery

## 2019-08-29 ENCOUNTER — Ambulatory Visit (HOSPITAL_COMMUNITY): Payer: BC Managed Care – PPO | Admitting: Physician Assistant

## 2019-08-29 DIAGNOSIS — G43909 Migraine, unspecified, not intractable, without status migrainosus: Secondary | ICD-10-CM | POA: Diagnosis not present

## 2019-08-29 DIAGNOSIS — K8010 Calculus of gallbladder with chronic cholecystitis without obstruction: Secondary | ICD-10-CM | POA: Diagnosis not present

## 2019-08-29 DIAGNOSIS — Z8249 Family history of ischemic heart disease and other diseases of the circulatory system: Secondary | ICD-10-CM | POA: Insufficient documentation

## 2019-08-29 DIAGNOSIS — R51 Headache: Secondary | ICD-10-CM | POA: Diagnosis not present

## 2019-08-29 DIAGNOSIS — F17210 Nicotine dependence, cigarettes, uncomplicated: Secondary | ICD-10-CM | POA: Insufficient documentation

## 2019-08-29 DIAGNOSIS — Z79899 Other long term (current) drug therapy: Secondary | ICD-10-CM | POA: Diagnosis not present

## 2019-08-29 DIAGNOSIS — K8044 Calculus of bile duct with chronic cholecystitis without obstruction: Secondary | ICD-10-CM | POA: Diagnosis not present

## 2019-08-29 DIAGNOSIS — M5136 Other intervertebral disc degeneration, lumbar region: Secondary | ICD-10-CM | POA: Insufficient documentation

## 2019-08-29 DIAGNOSIS — K811 Chronic cholecystitis: Secondary | ICD-10-CM | POA: Diagnosis not present

## 2019-08-29 DIAGNOSIS — Z791 Long term (current) use of non-steroidal anti-inflammatories (NSAID): Secondary | ICD-10-CM | POA: Diagnosis not present

## 2019-08-29 DIAGNOSIS — K8050 Calculus of bile duct without cholangitis or cholecystitis without obstruction: Secondary | ICD-10-CM | POA: Diagnosis not present

## 2019-08-29 HISTORY — PX: CHOLECYSTECTOMY: SHX55

## 2019-08-29 LAB — PREGNANCY, URINE: Preg Test, Ur: NEGATIVE

## 2019-08-29 SURGERY — LAPAROSCOPIC CHOLECYSTECTOMY
Anesthesia: General | Site: Abdomen

## 2019-08-29 MED ORDER — SCOPOLAMINE 1 MG/3DAYS TD PT72
MEDICATED_PATCH | TRANSDERMAL | Status: AC
  Filled 2019-08-29: qty 1

## 2019-08-29 MED ORDER — MIDAZOLAM HCL 2 MG/2ML IJ SOLN
INTRAMUSCULAR | Status: AC
  Filled 2019-08-29: qty 2

## 2019-08-29 MED ORDER — KETAMINE HCL 10 MG/ML IJ SOLN
INTRAMUSCULAR | Status: AC
  Filled 2019-08-29: qty 1

## 2019-08-29 MED ORDER — LIDOCAINE 20MG/ML (2%) 15 ML SYRINGE OPTIME
INTRAMUSCULAR | Status: DC | PRN
  Administered 2019-08-29: 1.5 mg/kg/h via INTRAVENOUS

## 2019-08-29 MED ORDER — TRAMADOL HCL 50 MG PO TABS: 50.0000 mg | ORAL_TABLET | Freq: Four times a day (QID) | ORAL | 0 refills | Status: DC | PRN

## 2019-08-29 MED ORDER — LACTATED RINGERS IR SOLN
Status: DC | PRN
  Administered 2019-08-29: 1000 mL

## 2019-08-29 MED ORDER — MIDAZOLAM HCL 5 MG/5ML IJ SOLN
INTRAMUSCULAR | Status: DC | PRN
  Administered 2019-08-29: 2 mg via INTRAVENOUS

## 2019-08-29 MED ORDER — PHENYLEPHRINE 40 MCG/ML (10ML) SYRINGE FOR IV PUSH (FOR BLOOD PRESSURE SUPPORT)
PREFILLED_SYRINGE | INTRAVENOUS | Status: AC
  Filled 2019-08-29: qty 10

## 2019-08-29 MED ORDER — FENTANYL CITRATE (PF) 100 MCG/2ML IJ SOLN
INTRAMUSCULAR | Status: AC
  Administered 2019-08-29: 50 ug via INTRAVENOUS
  Filled 2019-08-29: qty 2

## 2019-08-29 MED ORDER — KETAMINE HCL 10 MG/ML IJ SOLN
INTRAMUSCULAR | Status: DC | PRN
  Administered 2019-08-29: 30 mg via INTRAVENOUS

## 2019-08-29 MED ORDER — ACETAMINOPHEN 325 MG PO TABS: 650.0000 mg | ORAL_TABLET | ORAL | Status: DC | PRN

## 2019-08-29 MED ORDER — GABAPENTIN 300 MG PO CAPS
300.0000 mg | ORAL_CAPSULE | ORAL | Status: AC
  Administered 2019-08-29: 300 mg via ORAL
  Filled 2019-08-29: qty 1

## 2019-08-29 MED ORDER — ONDANSETRON HCL 4 MG/2ML IJ SOLN
INTRAMUSCULAR | Status: DC | PRN
  Administered 2019-08-29: 4 mg via INTRAVENOUS

## 2019-08-29 MED ORDER — LIDOCAINE HCL 2 % IJ SOLN
INTRAMUSCULAR | Status: AC
  Filled 2019-08-29: qty 20

## 2019-08-29 MED ORDER — ROCURONIUM BROMIDE 50 MG/5ML IV SOSY
PREFILLED_SYRINGE | INTRAVENOUS | Status: DC | PRN
  Administered 2019-08-29: 50 mg via INTRAVENOUS

## 2019-08-29 MED ORDER — FENTANYL CITRATE (PF) 100 MCG/2ML IJ SOLN
INTRAMUSCULAR | Status: DC | PRN
  Administered 2019-08-29 (×3): 50 ug via INTRAVENOUS

## 2019-08-29 MED ORDER — BUPIVACAINE HCL (PF) 0.25 % IJ SOLN
INTRAMUSCULAR | Status: AC
  Filled 2019-08-29: qty 30

## 2019-08-29 MED ORDER — FENTANYL CITRATE (PF) 250 MCG/5ML IJ SOLN
INTRAMUSCULAR | Status: AC
  Filled 2019-08-29: qty 5

## 2019-08-29 MED ORDER — 0.9 % SODIUM CHLORIDE (POUR BTL) OPTIME
TOPICAL | Status: DC | PRN
  Administered 2019-08-29: 1000 mL

## 2019-08-29 MED ORDER — SCOPOLAMINE 1 MG/3DAYS TD PT72
MEDICATED_PATCH | TRANSDERMAL | Status: DC | PRN
  Administered 2019-08-29: 1 via TRANSDERMAL

## 2019-08-29 MED ORDER — OXYCODONE HCL 5 MG PO TABS: 5.0000 mg | ORAL_TABLET | Freq: Once | ORAL | Status: DC | PRN

## 2019-08-29 MED ORDER — PROPOFOL 10 MG/ML IV BOLUS
INTRAVENOUS | Status: DC | PRN
  Administered 2019-08-29: 200 mg via INTRAVENOUS

## 2019-08-29 MED ORDER — ACETAMINOPHEN 500 MG PO TABS
1000.0000 mg | ORAL_TABLET | ORAL | Status: AC
  Administered 2019-08-29: 1000 mg via ORAL
  Filled 2019-08-29: qty 2

## 2019-08-29 MED ORDER — LIDOCAINE 2% (20 MG/ML) 5 ML SYRINGE
INTRAMUSCULAR | Status: DC | PRN
  Administered 2019-08-29: 100 mg via INTRAVENOUS

## 2019-08-29 MED ORDER — BUPIVACAINE HCL (PF) 0.25 % IJ SOLN
INTRAMUSCULAR | Status: DC | PRN
  Administered 2019-08-29: 30 mL

## 2019-08-29 MED ORDER — SUCCINYLCHOLINE CHLORIDE 200 MG/10ML IV SOSY
PREFILLED_SYRINGE | INTRAVENOUS | Status: DC | PRN
  Administered 2019-08-29: 100 mg via INTRAVENOUS

## 2019-08-29 MED ORDER — DEXAMETHASONE SODIUM PHOSPHATE 4 MG/ML IJ SOLN
INTRAMUSCULAR | Status: DC | PRN
  Administered 2019-08-29: 10 mg via INTRAVENOUS

## 2019-08-29 MED ORDER — SODIUM CHLORIDE 0.9 % IV SOLN: 250.0000 mL | INTRAVENOUS | Status: DC | PRN

## 2019-08-29 MED ORDER — ACETAMINOPHEN 650 MG RE SUPP
650.0000 mg | RECTAL | Status: DC | PRN
  Filled 2019-08-29: qty 1

## 2019-08-29 MED ORDER — FENTANYL CITRATE (PF) 100 MCG/2ML IJ SOLN: 25.0000 ug | INTRAMUSCULAR | Status: DC | PRN

## 2019-08-29 MED ORDER — CHLORHEXIDINE GLUCONATE 4 % EX LIQD: 60.0000 mL | Freq: Once | CUTANEOUS | Status: DC

## 2019-08-29 MED ORDER — FENTANYL CITRATE (PF) 100 MCG/2ML IJ SOLN
25.0000 ug | INTRAMUSCULAR | Status: DC | PRN
  Administered 2019-08-29 (×2): 50 ug via INTRAVENOUS

## 2019-08-29 MED ORDER — PROPOFOL 10 MG/ML IV BOLUS
INTRAVENOUS | Status: AC
  Filled 2019-08-29: qty 20

## 2019-08-29 MED ORDER — DOCUSATE SODIUM 100 MG PO CAPS: 100.0000 mg | ORAL_CAPSULE | Freq: Two times a day (BID) | ORAL | 0 refills | Status: AC

## 2019-08-29 MED ORDER — CEFAZOLIN SODIUM-DEXTROSE 2-4 GM/100ML-% IV SOLN
2.0000 g | INTRAVENOUS | Status: AC
  Administered 2019-08-29: 2 g via INTRAVENOUS
  Filled 2019-08-29: qty 100

## 2019-08-29 MED ORDER — ONDANSETRON HCL 4 MG/2ML IJ SOLN
4.0000 mg | Freq: Four times a day (QID) | INTRAMUSCULAR | Status: AC | PRN
  Administered 2019-08-29: 4 mg via INTRAVENOUS

## 2019-08-29 MED ORDER — SODIUM CHLORIDE 0.9% FLUSH: 3.0000 mL | INTRAVENOUS | Status: DC | PRN

## 2019-08-29 MED ORDER — OXYCODONE HCL 5 MG/5ML PO SOLN: 5.0000 mg | Freq: Once | ORAL | Status: DC | PRN

## 2019-08-29 MED ORDER — OXYCODONE HCL 5 MG PO TABS: 5.0000 mg | ORAL_TABLET | ORAL | Status: DC | PRN

## 2019-08-29 MED ORDER — SODIUM CHLORIDE 0.9% FLUSH: 3.0000 mL | Freq: Two times a day (BID) | INTRAVENOUS | Status: DC

## 2019-08-29 MED ORDER — LACTATED RINGERS IV SOLN
INTRAVENOUS | Status: DC
  Administered 2019-08-29: 06:00:00 via INTRAVENOUS

## 2019-08-29 MED ORDER — SUGAMMADEX SODIUM 200 MG/2ML IV SOLN
INTRAVENOUS | Status: DC | PRN
  Administered 2019-08-29: 200 mg via INTRAVENOUS

## 2019-08-29 MED ORDER — BUPIVACAINE LIPOSOME 1.3 % IJ SUSP
INTRAMUSCULAR | Status: DC | PRN
  Administered 2019-08-29: 20 mL

## 2019-08-29 SURGICAL SUPPLY — 34 items
APPLIER CLIP ROT 10 11.4 M/L (STAPLE) ×3
CABLE HIGH FREQUENCY MONO STRZ (ELECTRODE) ×3 IMPLANT
CHLORAPREP W/TINT 26 (MISCELLANEOUS) ×3 IMPLANT
CLIP APPLIE ROT 10 11.4 M/L (STAPLE) ×1 IMPLANT
COVER MAYO STAND STRL (DRAPES) IMPLANT
COVER SURGICAL LIGHT HANDLE (MISCELLANEOUS) ×3 IMPLANT
COVER WAND RF STERILE (DRAPES) IMPLANT
DECANTER SPIKE VIAL GLASS SM (MISCELLANEOUS) ×3 IMPLANT
DERMABOND ADVANCED (GAUZE/BANDAGES/DRESSINGS) ×2
DERMABOND ADVANCED .7 DNX12 (GAUZE/BANDAGES/DRESSINGS) ×1 IMPLANT
DRAPE C-ARM 42X120 X-RAY (DRAPES) IMPLANT
ELECT REM PT RETURN 15FT ADLT (MISCELLANEOUS) ×3 IMPLANT
GLOVE BIO SURGEON STRL SZ 6 (GLOVE) ×3 IMPLANT
GLOVE INDICATOR 6.5 STRL GRN (GLOVE) ×3 IMPLANT
GOWN STRL REUS W/TWL LRG LVL3 (GOWN DISPOSABLE) ×3 IMPLANT
GOWN STRL REUS W/TWL XL LVL3 (GOWN DISPOSABLE) ×6 IMPLANT
GRASPER SUT TROCAR 14GX15 (MISCELLANEOUS) ×3 IMPLANT
HEMOSTAT SNOW SURGICEL 2X4 (HEMOSTASIS) IMPLANT
KIT BASIN OR (CUSTOM PROCEDURE TRAY) ×3 IMPLANT
KIT TURNOVER KIT A (KITS) ×2 IMPLANT
NDL INSUFFLATION 14GA 120MM (NEEDLE) ×1 IMPLANT
NEEDLE INSUFFLATION 14GA 120MM (NEEDLE) ×3 IMPLANT
POUCH SPECIMEN RETRIEVAL 10MM (ENDOMECHANICALS) ×3 IMPLANT
SCISSORS LAP 5X35 DISP (ENDOMECHANICALS) ×3 IMPLANT
SET CHOLANGIOGRAPH MIX (MISCELLANEOUS) IMPLANT
SET IRRIG TUBING LAPAROSCOPIC (IRRIGATION / IRRIGATOR) ×3 IMPLANT
SET TUBE SMOKE EVAC HIGH FLOW (TUBING) ×3 IMPLANT
SLEEVE XCEL OPT CAN 5 100 (ENDOMECHANICALS) ×6 IMPLANT
SUT MNCRL AB 4-0 PS2 18 (SUTURE) ×3 IMPLANT
TOWEL OR 17X26 10 PK STRL BLUE (TOWEL DISPOSABLE) ×3 IMPLANT
TOWEL OR NON WOVEN STRL DISP B (DISPOSABLE) IMPLANT
TRAY LAPAROSCOPIC (CUSTOM PROCEDURE TRAY) ×3 IMPLANT
TROCAR BLADELESS OPT 5 100 (ENDOMECHANICALS) ×3 IMPLANT
TROCAR XCEL 12X100 BLDLESS (ENDOMECHANICALS) ×3 IMPLANT

## 2019-08-29 NOTE — Anesthesia Preprocedure Evaluation (Signed)
Anesthesia Evaluation  Patient identified by MRN, date of birth, ID band Patient awake    Reviewed: Allergy & Precautions, H&P , NPO status , Patient's Chart, lab work & pertinent test results  Airway Mallampati: II   Neck ROM: full    Dental   Pulmonary Current Smoker,    breath sounds clear to auscultation       Cardiovascular negative cardio ROS   Rhythm:regular Rate:Normal     Neuro/Psych  Headaches,    GI/Hepatic   Endo/Other    Renal/GU      Musculoskeletal   Abdominal   Peds  Hematology   Anesthesia Other Findings   Reproductive/Obstetrics                             Anesthesia Physical Anesthesia Plan  ASA: II  Anesthesia Plan: General   Post-op Pain Management:    Induction: Intravenous  PONV Risk Score and Plan: 2 and Ondansetron, Dexamethasone and Midazolam  Airway Management Planned: Oral ETT  Additional Equipment:   Intra-op Plan:   Post-operative Plan: Extubation in OR  Informed Consent: I have reviewed the patients History and Physical, chart, labs and discussed the procedure including the risks, benefits and alternatives for the proposed anesthesia with the patient or authorized representative who has indicated his/her understanding and acceptance.       Plan Discussed with: CRNA, Anesthesiologist and Surgeon  Anesthesia Plan Comments:         Anesthesia Quick Evaluation

## 2019-08-29 NOTE — Op Note (Signed)
Operative Note  Carrie Reed 33 y.o. female [id number]  08/29/2019  Surgeon: Clovis Riley MD FACS  Assistant: OR staff  Procedure performed: Laparoscopic Cholecystectomy  Preop diagnosis: biliary colic Post-op diagnosis/intraop findings: same, chronic cholecystitis  Specimens: gallbladder  Retained items: none  EBL: minimal  Complications: none  Description of procedure: After obtaining informed consent the patient was brought to the operating room. Antibiotics were administered. SCD's were applied. General endotracheal anesthesia was initiated and a formal time-out was performed. The abdomen was prepped and draped in the usual sterile fashion and the abdomen was entered using an infraumbilical veress needle after instilling the site with local. Insufflation to 104mmHg was obtained, 27mm trocar and camera inserted, and gross inspection revealed no evidence of injury from our entry or other intraabdominal abnormalities. Two 95mm trocars were introduced in the right midclavicular and right anterior axillary lines under direct visualization and following infiltration with local. An 57mm trocar was placed in the epigastrium. The gallbladder was retracted cephalad and the infundibulum was retracted laterally. Omental adhesions to the gallbladder were carefully lysed with blunt and cautery dissection, taking care to protect the nearby colon and duodenum. A combination of hook electrocautery and blunt dissection was utilized to clear the peritoneum from the neck and cystic duct, circumferentially isolating the cystic artery and cystic duct and lifting the gallbladder from the cystic plate. The critical view of safety was achieved with the cystic artery, cystic duct, and liver bed visualized between them with no other structures. The artery was clipped with two clips proximally and one distally and divided as was the cystic duct with three clips on the proximal end. The gallbladder was dissected from  the liver plate using electrocautery. Once freed the gallbladder was placed in an endocatch bag and removed intact through the epigastric trocar site. A minimal amount of bleeding on the liver bed was controlled with cautery.  Hemostasis was once again confirmed, and reinspection of the abdomen, including under the omentum, revealed no injuries. The clips were well opposed without any bile leak from the duct or the liver bed.  The 79mm trocar site in the epigastrium was closed with a 0 vicryl in the fascia under direct visualization using a PMI device. The abdomen was desufflated and all trocars removed. The skin incisions were closed with subcuticular monocryl and Dermabond. The patient was awakened, extubated and transported to the recovery room in stable condition.    All counts were correct at the completion of the case.

## 2019-08-29 NOTE — Interval H&P Note (Signed)
History and Physical Interval Note:  08/29/2019 6:53 AM  Carrie Reed  has presented today for surgery, with the diagnosis of BILIARY COLIC.  The various methods of treatment have been discussed with the patient and family. After consideration of risks, benefits and other options for treatment, the patient has consented to  Procedure(s): LAPAROSCOPIC CHOLECYSTECTOMY (N/A) as a surgical intervention.  The patient's history has been reviewed, patient examined, no change in status, stable for surgery.  I have reviewed the patient's chart and labs.  Questions were answered to the patient's satisfaction.     Chelsea Rich Brave

## 2019-08-29 NOTE — Anesthesia Procedure Notes (Addendum)
Procedure Name: Intubation Date/Time: 08/29/2019 7:24 AM Performed by: Deliah Boston, CRNA Pre-anesthesia Checklist: Patient identified, Emergency Drugs available, Suction available and Patient being monitored Patient Re-evaluated:Patient Re-evaluated prior to induction Oxygen Delivery Method: Circle system utilized Preoxygenation: Pre-oxygenation with 100% oxygen Induction Type: IV induction and Rapid sequence Laryngoscope Size: Mac and 4 Grade View: Grade I Tube type: Oral Tube size: 7.0 mm Number of attempts: 1 Airway Equipment and Method: Stylet and Oral airway Placement Confirmation: ETT inserted through vocal cords under direct vision,  positive ETCO2 and breath sounds checked- equal and bilateral Secured at: 21 cm Tube secured with: Tape Dental Injury: Teeth and Oropharynx as per pre-operative assessment  Comments: Intubation by [employer] student

## 2019-08-29 NOTE — Discharge Instructions (Signed)
LAPAROSCOPIC SURGERY: POST OP INSTRUCTIONS  ######################################################################  EAT Gradually transition to a high fiber diet with a fiber supplement over the next few weeks after discharge.  Start with a pureed / full liquid diet (see below)  WALK Walk an hour a day.  Control your pain to do that.    CONTROL PAIN Control pain so that you can walk, sleep, tolerate sneezing/coughing, go up/down stairs.  HAVE A BOWEL MOVEMENT DAILY Keep your bowels regular to avoid problems.  OK to try a laxative to override constipation.  OK to use an antidairrheal to slow down diarrhea.  Call if not better after 2 tries  CALL IF YOU HAVE PROBLEMS/CONCERNS Call if you are still struggling despite following these instructions. Call if you have concerns not answered by these instructions  ######################################################################    1. DIET: Follow a light bland diet & liquids the first 24 hours after arrival home, such as soup, liquids, starches, etc.  Be sure to drink plenty of fluids.  Quickly advance to a usual solid diet within a few days.  Avoid fast food or heavy meals as your are more likely to get nauseated or have irregular bowels.  A low-sugar, high-fiber diet for the rest of your life is ideal.  2. Take your usually prescribed home medications unless otherwise directed.  3. PAIN CONTROL: a. Pain is best controlled by a usual combination of three different methods TOGETHER: i. Ice/Heat ii. Over the counter pain medication iii. Prescription pain medication b. Most patients will experience some swelling and bruising around the incisions.  Ice packs or heating pads (30-60 minutes up to 6 times a day) will help. Use ice for the first few days to help decrease swelling and bruising, then switch to heat to help relax tight/sore spots and speed recovery.  Some people prefer to use ice alone, heat alone, alternating between ice & heat.   Experiment to what works for you.  Swelling and bruising can take several weeks to resolve.   c. It is helpful to take an over-the-counter pain medication regularly for the first few days.  Alternate: i. Naproxen (Aleve, etc)  Two 220mg  tabs twice a day OR  Ibuprofen (Advil, etc) Three 200mg  tabs four times a day (every meal & bedtime) AND ii. Acetaminophen (Tylenol, etc) 500-650mg  four times a day (every meal & bedtime) d. A  prescription for pain medication (such as oxycodone, hydrocodone, tramadol, gabapentin, methocarbamol, etc) should be given to you upon discharge.  Take your pain medication as prescribed.  i. If you are having problems/concerns with the prescription medicine (does not control pain, nausea, vomiting, rash, itching, etc), please call us [phone] to see if we need to switch you to a different pain medicine that will work better for you and/or control your side effect better. ii. If you need a refill on your pain medication, please give us 48 hour notice.  contact your pharmacy.  They will contact our office to request authorization. Prescriptions will not be filled after 5 pm or on week-ends  4. Avoid getting constipated.   a. Between the surgery and the pain medications, it is common to experience some constipation.   b. Increasing fluid intake and taking a fiber supplement (such as Metamucil, Citrucel, FiberCon, MiraLax, etc) 1-2 times a day regularly will usually help prevent this problem from occurring.   c. A mild laxative (prune juice, Milk of Magnesia, MiraLax, etc) should be taken according to package directions if there are no bowel  movements after 48 hours.   5. Watch out for diarrhea.   a. If you have many loose bowel movements, simplify your diet to bland foods & liquids for a few days.   b. Stop any stool softeners and decrease your fiber supplement.   c. Switching to mild anti-diarrheal medications (Kayopectate, Pepto Bismol) can help.   d. If this worsens or  does not improve, please call us.  6. Wash / shower every day.  You may shower over the skin glue which is waterproof.  Continue to shower over incision(s) after the dressing is off.  7. Glue will flake off after about 2 weeks.  You may leave the incision open to air.  You may replace a dressing/Band-Aid to cover the incision for comfort if you wish.   8. ACTIVITIES as tolerated:   a. You may resume regular (light) daily activities beginning the next day--such as daily self-care, walking, climbing stairs--gradually increasing activities as tolerated.  If you can walk 30 minutes without difficulty, it is safe to try more intense activity such as jogging, treadmill, bicycling, low-impact aerobics, swimming, etc. b. Save the most intensive and strenuous activity for last such as sit-ups, heavy lifting, contact sports, etc  Refrain from any heavy lifting or straining until you are off narcotics for pain control.   c. DO NOT PUSH THROUGH PAIN.  Let pain be your guide: If it hurts to do something, don't do it.  Pain is your body warning you to avoid that activity for another week until the pain goes down. d. You may drive when you are no longer taking prescription pain medication, you can comfortably wear a seatbelt, and you can safely maneuver your car and apply brakes. e. Dennis Bast may have sexual intercourse when it is comfortable.  9. FOLLOW UP in our office a. Please call [facility] at [phone] to set up an appointment to see your surgeon in the office for a follow-up appointment approximately 2-3 weeks after your surgery. b. Make sure that you call for this appointment the day you arrive home to insure a convenient appointment time.  10. IF YOU HAVE DISABILITY OR FAMILY LEAVE FORMS, BRING THEM TO THE OFFICE FOR PROCESSING.  DO NOT GIVE THEM TO YOUR DOCTOR.   WHEN TO CALL us [phone]: 1. Poor pain control 2. Reactions / problems with new medications (rash/itching, nausea, etc)  3. Fever over  101.5 F (38.5 C) 4. Inability to urinate 5. Nausea and/or vomiting 6. Worsening swelling or bruising 7. Continued bleeding from incision. 8. Increased pain, redness, or drainage from the incision   The clinic staff is available to answer your questions during regular business hours (8:30am-5pm).  Please dont hesitate to call and ask to speak to one of our nurses for clinical concerns.   If you have a medical emergency, go to the nearest emergency room or call 911.  A surgeon from [facility] is always on call at the [facility], [place], [place], [place], [place]  [address] ? MAIN: [phone] ? TOLL FREE: [phone] ?  FAX [phone] [email or web address]

## 2019-08-29 NOTE — Transfer of Care (Signed)
Immediate Anesthesia Transfer of Care Note  Patient: Carrie Reed  Procedure(s) Performed: Procedure(s): LAPAROSCOPIC CHOLECYSTECTOMY (N/A)  Patient Location: PACU  Anesthesia Type:General  Level of Consciousness: Patient easily awoken, sedated, comfortable, cooperative, following commands, responds to stimulation.   Airway & Oxygen Therapy: Patient spontaneously breathing, ventilating well, oxygen via simple oxygen mask.  Post-op Assessment: Report given to PACU RN, vital signs reviewed and stable, moving all extremities.   Post vital signs: Reviewed and stable.  Complications: No apparent anesthesia complications Last Vitals:  Vitals Value Taken Time  BP    Temp    Pulse 94 08/29/19 0835  Resp 18 08/29/19 0835  SpO2 100 % 08/29/19 0835  Vitals shown include unvalidated device data.  Last Pain:  Vitals:   08/29/19 0605  TempSrc: Oral         Complications: No apparent anesthesia complications

## 2019-08-30 ENCOUNTER — Encounter (HOSPITAL_COMMUNITY): Payer: Self-pay | Admitting: Surgery

## 2019-08-30 LAB — SURGICAL PATHOLOGY

## 2019-08-31 NOTE — Anesthesia Postprocedure Evaluation (Signed)
Anesthesia Post Note  Patient: Carrie Reed  Procedure(s) Performed: LAPAROSCOPIC CHOLECYSTECTOMY (N/A Abdomen)     Patient location during evaluation: PACU Anesthesia Type: General Level of consciousness: awake and alert Pain management: pain level controlled Vital Signs Assessment: post-procedure vital signs reviewed and stable Respiratory status: spontaneous breathing, nonlabored ventilation, respiratory function stable and patient connected to nasal cannula oxygen Cardiovascular status: blood pressure returned to baseline and stable Postop Assessment: no apparent nausea or vomiting Anesthetic complications: no    Last Vitals:  Vitals:   08/29/19 0915 08/29/19 0944  BP: 128/84 (!) 144/98  Pulse: 79 73  Resp: 12   Temp:  36.6 C  SpO2: 99% 98%    Last Pain:  Vitals:   08/29/19 0944  TempSrc:   PainSc: 0-No pain                 Haji Delaine S

## 2019-12-27 ENCOUNTER — Telehealth: Payer: Self-pay | Admitting: Adult Health

## 2019-12-27 NOTE — Telephone Encounter (Signed)
Patient called wondering if she could get a letter from PCP in regards to her new SUV's window tinting. She states she suffers from headaches with the bight sun driving but the tinting on the newly purchased car is too dark for [employer] standards. She is hoping Orpha Bur will be ok drafting a note that she can take to the [facility]. Please advise

## 2019-12-27 NOTE — Telephone Encounter (Signed)
Please review

## 2019-12-27 NOTE — Telephone Encounter (Signed)
Patient is aware of the below and verbalized understanding. Form faxed to Neuro per patient request. AS, CMA

## 2019-12-27 NOTE — Telephone Encounter (Signed)
Good Afternoon [name], Can you please call Carrie Reed and share- She is treated by Dr. Ahern/Neurology for her Migraine HAs- she can request letter from that practice. Sincerely, Orpha Bur

## 2019-12-28 ENCOUNTER — Telehealth: Payer: Self-pay | Admitting: Neurology

## 2019-12-28 NOTE — Telephone Encounter (Signed)
Pt has called [id number] coming from pt's PCP on either 01-18 or 12-27-19.  Pt states she has left messages for medical records and has not heard back, please call.

## 2019-12-28 NOTE — Telephone Encounter (Signed)
We received a tinted window waiver application form. We haven't seen her in 3 years. I spoke with Dr. Lucia Gaskins. Primary care should do the form.

## 2020-03-20 DIAGNOSIS — G4726 Circadian rhythm sleep disorder, shift work type: Secondary | ICD-10-CM | POA: Diagnosis not present

## 2020-03-20 DIAGNOSIS — G43009 Migraine without aura, not intractable, without status migrainosus: Secondary | ICD-10-CM | POA: Diagnosis not present

## 2020-03-20 DIAGNOSIS — G4440 Drug-induced headache, not elsewhere classified, not intractable: Secondary | ICD-10-CM | POA: Diagnosis not present

## 2020-03-20 DIAGNOSIS — G43709 Chronic migraine without aura, not intractable, without status migrainosus: Secondary | ICD-10-CM | POA: Diagnosis not present

## 2020-04-19 NOTE — Progress Notes (Signed)
Acute Office Visit  Subjective:    Patient ID: Carrie Reed, female    DOB: 02/11/1986, 34 y.o.   MRN: [id number]  Chief Complaint  Patient presents with  . Fatigue    HPI  Patient is in today for fatigue and tiredness. Pt reports for the past 3-4 months her fatigue has worsened. States she's falling asleep while reading and that's unusual for her. 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 She usually sleeps 5-6 hours and usually feels fine and can complete her tasks and responsibilities, but has noticed recently she is struggling. Reports she has failed several exams and is behind on her schoolwork. She feels like she cannot retain information. She has noticed she cannot sit still and at home her mind is all over the place. She denies weight changes, heat/cold intolerance, chest pain, paresthesias, or prior history of ADHD.    Past Medical History:  Diagnosis Date  . Bulging of cervical intervertebral disc   . Degenerative disc disease, lumbar   . Migraine     Past Surgical History:  Procedure Laterality Date  . APPENDECTOMY    . CHOLECYSTECTOMY N/A 08/29/2019   Procedure: LAPAROSCOPIC CHOLECYSTECTOMY;  Surgeon: Clovis Riley, MD;  Location: [facility];  Service: General;  Laterality: N/A;  . TONSILLECTOMY    . TONSILLECTOMY AND ADENOIDECTOMY     x2  . TUBAL LIGATION    . TYMPANOSTOMY TUBE PLACEMENT    . UTERINE TEAR     d/t late stage abortion    Family History  Problem Relation Age of Onset  . Cirrhosis Mother   . Kidney disease Mother   . Diabetes Mother   . Stroke Mother   . Clotting disorder Mother   . Heart failure Father   . Heart disease Father   . Heart attack Father   . Diabetes Father   . Hyperlipidemia Father   . Cancer Sister        cervical cancer  . Hypertension Brother   . Clotting disorder Brother   . High blood pressure Brother   . Cervical cancer Sister   . Suicidality Maternal Uncle   . Suicidality  Paternal Aunt   . Diabetes Paternal Grandmother   . Cancer Paternal Grandfather        throat  . Suicidality Cousin   . Migraines Neg Hx     Social History   Socioeconomic History  . Marital status: Married    Spouse name: Jenny Reichmann  . Number of children: 4  . Years of education: Associates  . Highest education level: Not on file  Occupational History  . Occupation: Teacher, adult education  Tobacco Use  . Smoking status: Current Every Day Smoker    Packs/day: 0.25    Years: 12.00    Pack years: 3.00    Types: Cigarettes  . Smokeless tobacco: Never Used  Substance and Sexual Activity  . Alcohol use: Yes    Alcohol/week: 0.0 standard drinks    Comment: social  . Drug use: No  . Sexual activity: Yes  Other Topics Concern  . Not on file  Social History Narrative   Lives at home with husband and 4 children.   Caffeine use:  Drinks coffee (1/2 pot-1 pot every day)    Right-handed   Social Determinants of Health   Financial Resource Strain:   . Difficulty of Paying Living Expenses:   Food Insecurity:   . Worried  About Running Out of Food in the Last Year:   . Ran Out of Food in the Last Year:   Transportation Needs:   . Lack of Transportation (Medical):   . Lack of Transportation (Non-Medical):   Physical Activity:   . Days of Exercise per Week:   . Minutes of Exercise per Session:   Stress:   . Feeling of Stress :   Social Connections:   . Frequency of Communication with Friends and Family:   . Frequency of Social Gatherings with Friends and Family:   . Attends Religious Services:   . Active Member of Clubs or Organizations:   . Attends Club or Organization Meetings:   . Marital Status:   Intimate Partner Violence:   . Fear of Current or Ex-Partner:   . Emotionally Abused:   . Physically Abused:   . Sexually Abused:     Outpatient Medications Prior to Visit  Medication Sig Dispense Refill  . EMGALITY 120 MG/ML SOAJ SMARTSIG:2 Milliliter(s) SUB-Q Once    .  ibuprofen (ADVIL,MOTRIN) 800 MG tablet Take 1 tablet (800 mg total) by mouth 3 (three) times daily. (Patient taking differently: Take 800 mg by mouth daily as needed for headache or mild pain. ) 21 tablet 0  . NURTEC 75 MG TBDP Take 1 tablet by mouth daily as needed.    . ondansetron (ZOFRAN) 4 MG tablet Take 4 mg by mouth every 8 (eight) hours as needed for nausea or vomiting.    . promethazine (PHENERGAN) 25 MG tablet Take 1 tablet (25 mg total) by mouth every 6 (six) hours as needed for nausea or vomiting. 30 tablet 0  . traMADol (ULTRAM) 50 MG tablet Take 1 tablet (50 mg total) by mouth every 6 (six) hours as needed. (Patient not taking: Reported on 04/20/2020) 20 tablet 0  . venlafaxine XR (EFFEXOR-XR) 150 MG 24 hr capsule Take 150 mg by mouth daily with breakfast.     No facility-administered medications prior to visit.    Allergies  Allergen Reactions  . Propranolol Palpitations    Review of Systems Review of Systems:  A fourteen system review of systems was performed and found to be positive as per HPI.     Objective:    Physical Exam  General: Well nourished, in no apparent distress. Eyes: PERRLA, EOMs, conjunctiva clr Resp: Respiratory effort- normal, ECTA B/L w/o W/R/R  Cardio: RRR w/o MRGs. Abdomen: no gross distention. Lymphatics:  less 2 sec cap RF M-sk: Full ROM, 5/5 strength, normal gait.  Skin: Warm, dry  Neuro: Alert, Oriented Psych: Normal affect, Insight and Judgment appropriate.   BP 120/81   Pulse 89   Temp 97.6 F (36.4 C) (Oral)   Ht 5' 6.5" (1.689 m)   Wt 218 lb 14.4 oz (99.3 kg)   LMP 04/20/2020   SpO2 98%   BMI 34.80 kg/m  Wt Readings from Last 3 Encounters:  04/20/20 218 lb 14.4 oz (99.3 kg)  08/29/19 223 lb (101.2 kg)  08/25/19 222 lb 3 oz (100.8 kg)    Health Maintenance Due  Topic Date Due  . COVID-19 Vaccine (1) Never done  . PAP SMEAR-Modifier  09/20/2017    There are no preventive care reminders to display for this  patient.   Lab Results  Component Value Date   TSH 2.330 12/03/2017   Lab Results  Component Value Date   WBC 7.4 08/25/2019   HGB 13.2 08/25/2019   HCT 39.6 08/25/2019   MCV 94.5 08/25/2019     PLT 296 08/25/2019   Lab Results  Component Value Date   NA 137 08/08/2019   K 3.9 08/08/2019   CO2 20 (L) 08/08/2019   GLUCOSE 89 08/08/2019   BUN 15 08/08/2019   CREATININE 0.91 08/08/2019   BILITOT 0.3 08/08/2019   ALKPHOS 63 08/08/2019   AST 32 08/08/2019   ALT 38 08/08/2019   PROT 7.5 08/08/2019   ALBUMIN 4.5 08/08/2019   CALCIUM 9.1 08/08/2019   ANIONGAP 10 08/08/2019   Lab Results  Component Value Date   CHOL 195 12/03/2017   Lab Results  Component Value Date   HDL 45 12/03/2017   Lab Results  Component Value Date   LDLCALC 98 12/03/2017   Lab Results  Component Value Date   TRIG 262 (H) 12/03/2017   Lab Results  Component Value Date   CHOLHDL 4.3 12/03/2017   Lab Results  Component Value Date   HGBA1C 5.1 12/03/2017       Assessment & Plan:   Problem List Items Addressed This Visit    None    Visit Diagnoses    Fatigue, unspecified type    -  Primary   Relevant Orders   CBC w/Diff   Comp Met (CMET)   TSH   Vitamin D (25 hydroxy)   Adult ADHD       Relevant Medications   amphetamine-dextroamphetamine (ADDERALL XR) 20 MG 24 hr capsule       Meds ordered this encounter  Medications  . amphetamine-dextroamphetamine (ADDERALL XR) 20 MG 24 hr capsule    Sig: Take 1 capsule (20 mg total) by mouth every morning.    Dispense:  30 capsule    Refill:  0    Order Specific Question:   Supervising Provider    Answer:   METHENEY, CATHERINE D [[id number]]   Fatigue: - Pt's fatigue is most likely multifactorial with persistent exhaustion, work, personal life, school and lack of rest. - Will order CBC, CMP, TSH, and Vitamin D to evaluate for possible etiologies such as anemia, thyroid disorder, electrolyte abnormalities, or vitamin D deficiency. -  Recommend good sleep hygiene.  ADHD: - Pt's symptoms and Adult ADHD Self-Report Scale symptom checklist are consistent with ADHD. - Discussed with patient treatment options, risks vs benefits, and pt agreeable to starting Adderall XR 20 mg once daily.  - Discussed with patient  regular 3-month follow-ups for management and medication refills. Pt verbalized understanding.  Maritza Abonza, PA-C 

## 2020-04-20 ENCOUNTER — Encounter: Payer: Self-pay | Admitting: Physician Assistant

## 2020-04-20 ENCOUNTER — Ambulatory Visit (INDEPENDENT_AMBULATORY_CARE_PROVIDER_SITE_OTHER): Payer: BC Managed Care – PPO | Admitting: Physician Assistant

## 2020-04-20 ENCOUNTER — Other Ambulatory Visit: Payer: Self-pay

## 2020-04-20 VITALS — BP 120/81 | HR 89 | Temp 97.6°F | Ht 66.5 in | Wt 218.9 lb

## 2020-04-20 DIAGNOSIS — R5383 Other fatigue: Secondary | ICD-10-CM | POA: Diagnosis not present

## 2020-04-20 DIAGNOSIS — F909 Attention-deficit hyperactivity disorder, unspecified type: Secondary | ICD-10-CM

## 2020-04-20 MED ORDER — AMPHETAMINE-DEXTROAMPHET ER 20 MG PO CP24: 20.0000 mg | ORAL_CAPSULE | ORAL | 0 refills | Status: DC

## 2020-04-20 NOTE — Patient Instructions (Signed)

## 2020-04-21 LAB — CBC WITH DIFFERENTIAL/PLATELET
Basophils Absolute: 0.1 10*3/uL (ref 0.0–0.2)
Basos: 1 %
EOS (ABSOLUTE): 0.2 10*3/uL (ref 0.0–0.4)
Eos: 3 %
Hematocrit: 39.8 % (ref 34.0–46.6)
Hemoglobin: 13.7 g/dL (ref 11.1–15.9)
Immature Grans (Abs): 0 10*3/uL (ref 0.0–0.1)
Immature Granulocytes: 0 %
Lymphocytes Absolute: 2 10*3/uL (ref 0.7–3.1)
Lymphs: 34 %
MCH: 31.4 pg (ref 26.6–33.0)
MCHC: 34.4 g/dL (ref 31.5–35.7)
MCV: 91 fL (ref 79–97)
Monocytes Absolute: 0.4 10*3/uL (ref 0.1–0.9)
Monocytes: 7 %
Neutrophils Absolute: 3.2 10*3/uL (ref 1.4–7.0)
Neutrophils: 55 %
Platelets: 312 10*3/uL (ref 150–450)
RBC: 4.36 x10E6/uL (ref 3.77–5.28)
RDW: 12.3 % (ref 11.7–15.4)
WBC: 5.9 10*3/uL (ref 3.4–10.8)

## 2020-04-21 LAB — COMPREHENSIVE METABOLIC PANEL
ALT: 27 IU/L (ref 0–32)
AST: 22 IU/L (ref 0–40)
Albumin/Globulin Ratio: 1.9 (ref 1.2–2.2)
Albumin: 4.4 g/dL (ref 3.8–4.8)
Alkaline Phosphatase: 69 IU/L (ref 39–117)
BUN/Creatinine Ratio: 11 (ref 9–23)
BUN: 10 mg/dL (ref 6–20)
Bilirubin Total: 0.4 mg/dL (ref 0.0–1.2)
CO2: 24 mmol/L (ref 20–29)
Calcium: 9.6 mg/dL (ref 8.7–10.2)
Chloride: 103 mmol/L (ref 96–106)
Creatinine, Ser: 0.88 mg/dL (ref 0.57–1.00)
GFR calc Af Amer: 98 mL/min/{1.73_m2} (ref >59)
GFR calc non Af Amer: 85 mL/min/{1.73_m2} (ref >59)
Globulin, Total: 2.3 g/dL (ref 1.5–4.5)
Glucose: 90 mg/dL (ref 65–99)
Potassium: 4.4 mmol/L (ref 3.5–5.2)
Sodium: 138 mmol/L (ref 134–144)
Total Protein: 6.7 g/dL (ref 6.0–8.5)

## 2020-04-21 LAB — TSH: TSH: 2.19 u[IU]/mL (ref 0.450–4.500)

## 2020-04-21 LAB — VITAMIN D 25 HYDROXY (VIT D DEFICIENCY, FRACTURES): Vit D, 25-Hydroxy: 30.1 ng/mL (ref 30.0–100.0)

## 2020-05-30 ENCOUNTER — Other Ambulatory Visit: Payer: Self-pay | Admitting: Physician Assistant

## 2020-05-30 DIAGNOSIS — F909 Attention-deficit hyperactivity disorder, unspecified type: Secondary | ICD-10-CM

## 2020-05-30 NOTE — Telephone Encounter (Signed)
Patient called requested refill on :   amphetamine-dextroamphetamine (ADDERALL XR) 20 MG 24 hr capsule [[id number]]   Order Details Dose: 20 mg Route: Oral Frequency: BH-each morning  Dispense Quantity: 30 capsule Refills: 0       Sig: Take 1 capsule (20 mg total) by mouth every morning.       Pt uses  :   [facility] [address]), [place] - [id number] Phone:  [phone]  Fax:  [phone]     --glh

## 2020-05-30 NOTE — Telephone Encounter (Signed)
Patient last seen 04/20/20 and advised to follow up in 3 months. Patient was given #30 adderall on that day.    Please approve refill if deemed appropriate. AS, CMA

## 2020-05-31 MED ORDER — AMPHETAMINE-DEXTROAMPHET ER 20 MG PO CP24: 20.0000 mg | ORAL_CAPSULE | ORAL | 0 refills | Status: DC

## 2020-07-10 ENCOUNTER — Other Ambulatory Visit: Payer: Self-pay | Admitting: Physician Assistant

## 2020-07-10 DIAGNOSIS — F909 Attention-deficit hyperactivity disorder, unspecified type: Secondary | ICD-10-CM

## 2020-07-10 MED ORDER — AMPHETAMINE-DEXTROAMPHET ER 20 MG PO CP24: 20.0000 mg | ORAL_CAPSULE | ORAL | 0 refills | Status: DC

## 2020-07-10 NOTE — Addendum Note (Signed)
Addended by: Sylvester Harder on: 07/10/2020 10:39 AM   Modules accepted: Orders

## 2020-07-10 NOTE — Telephone Encounter (Signed)
Patient last seen 04/20/20 and advised to follow up in 3 months.   Patient does not have future apt scheduled.   Last refill given 05/31/20 # 30 with no refills.    Please approve or deny if deemed appropriate. AS, CMA

## 2020-07-10 NOTE — Telephone Encounter (Signed)
Patient is requesting a refill of her Adderall, if approved please send to [facility] on [address]

## 2020-07-23 DIAGNOSIS — G43709 Chronic migraine without aura, not intractable, without status migrainosus: Secondary | ICD-10-CM | POA: Diagnosis not present

## 2020-07-23 DIAGNOSIS — G43009 Migraine without aura, not intractable, without status migrainosus: Secondary | ICD-10-CM | POA: Insufficient documentation

## 2020-08-14 ENCOUNTER — Ambulatory Visit
Admission: EM | Admit: 2020-08-14 | Discharge: 2020-08-14 | Disposition: A | Discharge status: Home / Self Care | Payer: BC Managed Care – PPO | Attending: Emergency Medicine | Admitting: Emergency Medicine

## 2020-08-14 DIAGNOSIS — M5442 Lumbago with sciatica, left side: Secondary | ICD-10-CM | POA: Diagnosis not present

## 2020-08-14 MED ORDER — IBUPROFEN 800 MG PO TABS: 800.0000 mg | ORAL_TABLET | Freq: Three times a day (TID) | ORAL | 0 refills | Status: AC

## 2020-08-14 MED ORDER — CYCLOBENZAPRINE HCL 5 MG PO TABS: 5.0000 mg | ORAL_TABLET | Freq: Two times a day (BID) | ORAL | 0 refills | Status: AC | PRN

## 2020-08-14 NOTE — ED Triage Notes (Signed)
Pt presents with complaints of back pain in her left lower side. Reports she was cleaning yesterday and started experiencing some pain. States she got up to go sit down and and she started to have severe pain that shot down her leg. Reports the pain is better today than yesterday. Difficult to stand from sitting position.

## 2020-08-14 NOTE — ED Provider Notes (Signed)
[name]    CSN: [id number] Arrival date & time: 08/14/20  1103      History   Chief Complaint Chief Complaint  Patient presents with  . Back Pain    HPI Carrie Reed is a 34 y.o. female  Presenting for left low back pain with radiation down the thigh.  States that she was cleaning yesterday when this started.  States is worse with certain movements.  Denies fall, trauma to affected area.  No saddle anesthesia, lower extremity numbness or weakness, urinary retention or fecal incontinence.  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  Patient did have ER visit 07/30/2018 significant for L5 radiculopathy with radiculitis that improved with steroids: Please see those records, reviewed by me at time of visit.  Has not seen specialist since.  Past Medical History:  Diagnosis Date  . Bulging of cervical intervertebral disc   . Degenerative disc disease, lumbar   . Migraine     Patient Active Problem List   Diagnosis Date Noted  . Lower extremity edema 07/05/2019  . Bulging of cervical intervertebral disc 12/24/2017  . Degeneration of lumbar intervertebral disc 12/16/2017  . Chronic low back pain 12/16/2017  . Acute maxillary sinusitis 11/11/2017  . Cough 11/11/2017  . Chronic RUQ pain 08/12/2017  . Healthcare maintenance 08/12/2017  . Intractable chronic migraine without aura and without status migrainosus 04/27/2016  . Migraine 08/02/2015  . Pulsatile tinnitus of left ear 08/02/2015  . Blurry vision 08/02/2015  . Worsening headaches 08/02/2015    Past Surgical History:  Procedure Laterality Date  . APPENDECTOMY    . CHOLECYSTECTOMY N/A 08/29/2019   Procedure: LAPAROSCOPIC CHOLECYSTECTOMY;  Surgeon: Berna Bue, MD;  Location: [facility];  Service: General;  Laterality: N/A;  . TONSILLECTOMY    . TONSILLECTOMY AND ADENOIDECTOMY     x2  . TUBAL LIGATION    . TYMPANOSTOMY TUBE PLACEMENT    . UTERINE TEAR     d/t late stage abortion    OB  History   No obstetric history on file.      Home Medications    Prior to Admission medications   Medication Sig Start Date End Date Taking? Authorizing Provider  amphetamine-dextroamphetamine (ADDERALL XR) 20 MG 24 hr capsule Take 1 capsule (20 mg total) by mouth every morning. 07/10/20  Yes Abonza, Maritza, PA-C  EMGALITY 120 MG/ML SOAJ SMARTSIG:2 Milliliter(s) SUB-Q Once 03/26/20  Yes [provider]  NURTEC 75 MG TBDP Take 1 tablet by mouth daily as needed. 03/20/20  Yes [provider]  ondansetron (ZOFRAN) 4 MG tablet Take 4 mg by mouth every 8 (eight) hours as needed for nausea or vomiting.   Yes [provider]  cyclobenzaprine (FLEXERIL) 5 MG tablet Take 1 tablet (5 mg total) by mouth 2 (two) times daily as needed for up to 7 days for muscle spasms. 08/14/20 08/21/20  Hall-Potvin, [place], PA-C  ibuprofen (ADVIL) 800 MG tablet Take 1 tablet (800 mg total) by mouth 3 (three) times daily. 08/14/20   Hall-Potvin, [place], PA-C  promethazine (PHENERGAN) 25 MG tablet Take 1 tablet (25 mg total) by mouth every 6 (six) hours as needed for nausea or vomiting. 08/09/19 08/14/20  McDonald, Coral Else, PA-C    Family History Family History  Problem Relation Age of Onset  . Cirrhosis Mother   . Kidney disease Mother   . Diabetes Mother   . Stroke Mother   . Clotting disorder Mother   . Heart failure  Father   . Heart disease Father   . Heart attack Father   . Diabetes Father   . Hyperlipidemia Father   . Cancer Sister        cervical cancer  . Hypertension Brother   . Clotting disorder Brother   . High blood pressure Brother   . Cervical cancer Sister   . Suicidality Maternal Uncle   . Suicidality Paternal Aunt   . Diabetes Paternal Grandmother   . Cancer Paternal Grandfather        throat  . Suicidality Cousin   . Migraines Neg Hx     Social History Social History   Tobacco Use  . Smoking status: Current Every Day Smoker    Packs/day: 0.25    Years: 12.00     Pack years: 3.00    Types: Cigarettes  . Smokeless tobacco: Never Used  Vaping Use  . Vaping Use: Never used  Substance Use Topics  . Alcohol use: Yes    Alcohol/week: 0.0 standard drinks    Comment: social  . Drug use: No     Allergies   Propranolol   Review of Systems As per HPI   Physical Exam Triage Vital Signs ED Triage Vitals  Enc Vitals Group     BP      Pulse      Resp      Temp      Temp src      SpO2      Weight      Height      Head Circumference      Peak Flow      Pain Score      Pain Loc      Pain Edu?      Excl. in GC?    No data found.  Updated Vital Signs BP 121/83   Pulse 85   Temp 98.3 F (36.8 C)   Resp 18   LMP 08/14/2020   SpO2 96%   Visual Acuity Right Eye Distance:   Left Eye Distance:   Bilateral Distance:    Right Eye Near:   Left Eye Near:    Bilateral Near:     Physical Exam Constitutional:      General: She is not in acute distress. HENT:     Head: Normocephalic and atraumatic.  Eyes:     General: No scleral icterus.    Pupils: Pupils are equal, round, and reactive to light.  Cardiovascular:     Rate and Rhythm: Normal rate.  Pulmonary:     Effort: Pulmonary effort is normal.  Musculoskeletal:     Comments: No lumbar spine deformity, tenderness.  No PSIS tenderness bilaterally.  Patient does have diffuse left paraspinal swelling, tenderness without crepitus, mass, fluctuance.  Positive SLR of left.  Neurovascular intact bilaterally  Skin:    Coloration: Skin is not jaundiced or pale.     Findings: No rash.  Neurological:     Mental Status: She is alert and oriented to person, place, and time.      UC Treatments / Results  Labs (all labs ordered are listed, but only abnormal results are displayed) Labs Reviewed - No data to display  EKG   Radiology No results found.  Procedures Procedures (including critical care time)  Medications Ordered in UC Medications - No data to display  Initial  Impression / Assessment and Plan / UC Course  I have reviewed the triage vital signs and the nursing notes.  Pertinent labs & imaging results that were available during my care of the patient were reviewed by me and considered in my medical decision making (see chart for details).     H&P consistent with left low back pain with left-sided sciatica.  Offered steroids, XR (low yield at this time); patient declined.  Will trial NSAID, rice, and have patient follow-up with orthopedics.  Work note provided patient's request.  Return precautions discussed, pt verbalized understanding and is agreeable to plan. Final Clinical Impressions(s) / UC Diagnoses   Final diagnoses:  Acute left-sided low back pain with left-sided sciatica     Discharge Instructions     Cold therapy (ice packs) can be used to help swelling both after injury and after prolonged use of areas of chronic pain/aches.  Pain medication:  350 mg-1000 mg of Tylenol (acetaminophen) and/or 200 mg - 800 mg of Advil (ibuprofen, Motrin) every 8 hours as needed.  May alternate between the two throughout the day as they are generally safe to take together.  DO NOT exceed more than 3000 mg of Tylenol or 3200 mg of ibuprofen in a 24 hour period as this could damage your stomach, kidneys, liver, or increase your bleeding risk.  May take muscle relaxer as needed for severe pain / spasm.  (This medication may cause you to become tired so it is important you do not drink alcohol or operate heavy machinery while on this medication.  Recommend your first dose to be taken before bedtime to monitor for side effects safely)  Important to follow up with specialist(s) below for further evaluation/management if your symptoms persist or worsen.    ED Prescriptions    Medication Sig Dispense Auth. Provider   ibuprofen (ADVIL) 800 MG tablet Take 1 tablet (800 mg total) by mouth 3 (three) times daily. 21 tablet Hall-Potvin, [place], PA-C    cyclobenzaprine (FLEXERIL) 5 MG tablet Take 1 tablet (5 mg total) by mouth 2 (two) times daily as needed for up to 7 days for muscle spasms. 14 tablet Hall-Potvin, [place], PA-C     I have reviewed the PDMP during this encounter.   Hall-Potvin, [place], [place] 08/14/20 1207

## 2020-08-14 NOTE — Discharge Instructions (Addendum)
Cold therapy (ice packs) can be used to help swelling both after injury and after prolonged use of areas of chronic pain/aches.  Pain medication:  350 mg-1000 mg of Tylenol (acetaminophen) and/or 200 mg - 800 mg of Advil (ibuprofen, Motrin) every 8 hours as needed.  May alternate between the two throughout the day as they are generally safe to take together.  DO NOT exceed more than 3000 mg of Tylenol or 3200 mg of ibuprofen in a 24 hour period as this could damage your stomach, kidneys, liver, or increase your bleeding risk.  May take muscle relaxer as needed for severe pain / spasm.  (This medication may cause you to become tired so it is important you do not drink alcohol or operate heavy machinery while on this medication.  Recommend your first dose to be taken before bedtime to monitor for side effects safely)  Important to follow up with specialist(s) below for further evaluation/management if your symptoms persist or worsen. 

## 2020-08-21 DIAGNOSIS — M545 Low back pain: Secondary | ICD-10-CM | POA: Diagnosis not present

## 2020-08-21 DIAGNOSIS — M5412 Radiculopathy, cervical region: Secondary | ICD-10-CM | POA: Diagnosis not present

## 2020-08-21 DIAGNOSIS — M542 Cervicalgia: Secondary | ICD-10-CM | POA: Diagnosis not present

## 2020-08-21 DIAGNOSIS — M5136 Other intervertebral disc degeneration, lumbar region: Secondary | ICD-10-CM | POA: Diagnosis not present

## 2020-08-22 ENCOUNTER — Telehealth: Payer: Self-pay | Admitting: Physician Assistant

## 2020-08-22 DIAGNOSIS — F909 Attention-deficit hyperactivity disorder, unspecified type: Secondary | ICD-10-CM

## 2020-08-22 NOTE — Telephone Encounter (Signed)
Patient is aware at last visit in May she was advised to follow up in 3 months and per controlled substance contract she has to have appointment for further med refills. Patient was understanding. Call transferred to front desk for scheduling. AS, CMA

## 2020-08-22 NOTE — Telephone Encounter (Signed)
Patient called in requesting a refill on adderall. Please send to [facility] pharmacy on [address].

## 2020-08-29 ENCOUNTER — Telehealth: Payer: Self-pay | Admitting: Physician Assistant

## 2020-08-29 DIAGNOSIS — R5383 Other fatigue: Secondary | ICD-10-CM

## 2020-08-29 DIAGNOSIS — E049 Nontoxic goiter, unspecified: Secondary | ICD-10-CM

## 2020-08-29 NOTE — Addendum Note (Signed)
Addended by: Sylvester Harder on: 08/29/2020 01:46 PM   Modules accepted: Orders

## 2020-08-29 NOTE — Telephone Encounter (Signed)
Patient is aware [place] has been ordered. AS, CMA

## 2020-08-29 NOTE — Telephone Encounter (Signed)
Patient needs to get an xray in for Carrie Reed and it was done [employer]. It is for calcium buildup on the thyroid. Needs the doctors opinion,  Thanks

## 2020-08-29 NOTE — Telephone Encounter (Signed)
Per Kandis Cocking ok to place order for thyroid ultrasound. AS, CMA

## 2020-09-05 ENCOUNTER — Ambulatory Visit
Admission: RE | Admit: 2020-09-05 | Discharge: 2020-09-05 | Disposition: A | Discharge status: Home / Self Care | Payer: BC Managed Care – PPO | Source: Ambulatory Visit | Attending: Physician Assistant | Admitting: Physician Assistant

## 2020-09-05 DIAGNOSIS — R5383 Other fatigue: Secondary | ICD-10-CM

## 2020-09-05 DIAGNOSIS — E049 Nontoxic goiter, unspecified: Secondary | ICD-10-CM

## 2020-09-20 DIAGNOSIS — M5030 Other cervical disc degeneration, unspecified cervical region: Secondary | ICD-10-CM | POA: Diagnosis not present

## 2020-09-25 ENCOUNTER — Other Ambulatory Visit: Payer: Self-pay

## 2020-09-25 ENCOUNTER — Ambulatory Visit (INDEPENDENT_AMBULATORY_CARE_PROVIDER_SITE_OTHER): Payer: BC Managed Care – PPO | Admitting: Physician Assistant

## 2020-09-25 ENCOUNTER — Encounter: Payer: Self-pay | Admitting: Physician Assistant

## 2020-09-25 VITALS — BP 124/81 | HR 85 | Temp 97.9°F | Ht 66.5 in | Wt 216.0 lb

## 2020-09-25 DIAGNOSIS — R5383 Other fatigue: Secondary | ICD-10-CM

## 2020-09-25 DIAGNOSIS — E049 Nontoxic goiter, unspecified: Secondary | ICD-10-CM | POA: Diagnosis not present

## 2020-09-25 DIAGNOSIS — F909 Attention-deficit hyperactivity disorder, unspecified type: Secondary | ICD-10-CM | POA: Diagnosis not present

## 2020-09-25 MED ORDER — AMPHETAMINE-DEXTROAMPHET ER 20 MG PO CP24: 20.0000 mg | ORAL_CAPSULE | ORAL | 0 refills | Status: DC

## 2020-09-25 NOTE — Progress Notes (Signed)
Telehealth office visit note for Carrie Masker, PA-C- at Primary Care at [facility]   I connected with current patient today by telephone and verified that I am speaking with the correct person   . Location of the patient: Home . Location of the provider: Office - This visit type was conducted due to national recommendations for restrictions regarding the COVID-19 Pandemic (e.g. social distancing) in an effort to limit this patient's exposure and mitigate transmission in our community.    - No physical exam could be performed with this format, beyond that communicated to Carrie Reed by the patient/ family members as noted.   - Additionally my office staff/ schedulers were to discuss with the patient that there may be a monetary charge related to this service, depending on their medical insurance.  My understanding is that patient understood and consented to proceed.     _________________________________________________________________________________   History of Present Illness: Pt calls in for follow up on ADHD management and to discuss thyroid ultrasound. Tolerating medication without issues. Adderall has helped with focus and concentration, and is able to complete assignments. Her school grades have improved. Needs a refill. Forgot to schedule follow-up appointment so has been out of medication. Continues to have fatigue and feels tired throughout the day. Tries to get at least 6 hours of sleep but it varies due to taking care of her children, school and work.  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 Patient had imaging done with orthopedics for cervical and shoulder pain which revealed abnormal structure of thyroid.     GAD 7 : Generalized Anxiety Score 04/20/2020  Nervous, Anxious, on Edge 0  Control/stop worrying 1  Worry too much - different things 1  Trouble relaxing 1   Restless 1  Easily annoyed or irritable 3  Afraid - awful might happen 0  Total GAD 7 Score 7  Anxiety Difficulty Extremely difficult    Depression screen [facility] 2/9 09/25/2020 04/20/2020 07/19/2019 07/05/2019 12/24/2017  Decreased Interest 0 0 0 0 0  Down, Depressed, Hopeless 0 0 0 0 0  PHQ - 2 Score 0 0 0 0 0  Altered sleeping 3 2 3 3 1   Tired, decreased energy 3 3 3 3 1   Change in appetite - 2 2 1  0  Feeling bad or failure about yourself  1 0 0 0 0  Trouble concentrating 3 3 0 0 0  Moving slowly or fidgety/restless 2 0 0 0 0  Suicidal thoughts 0 0 0 0 0  PHQ-9 Score 12 10 8 7 2   Difficult doing work/chores Somewhat difficult Very difficult Very difficult Very difficult Not difficult at all      Impression and Recommendations:     1. Thyroid enlarged   2. Adult ADHD   3. Fatigue, unspecified type      Adult ADHD: -Stable -PDMP reviewed, no aberrancies noted.  Provided refill. -Follow-up in 3 months for medication management.  Thyroid enlarged: -Discussed with patient thyroid ultrasound was within normal limits, no nodules or abnormal lymph nodes, and revealed enlargement of right lobe. -Last TSH within normal limits  Fatigue: -Discussed with patient lack of sleep is likely contributing to fatigue. Previous work-up with labs was unremarkable. -Patient unsure if snores at night and discussed consideration for sleep study to evaluate for possible sleep apnea. Patient verbalized understanding and will let  me know if would like to pursue. -Recommend to improve sleep routine/schedule.    - As part of my medical decision making, I reviewed the following data within the electronic MEDICAL RECORD NUMBER History obtained from pt /family, CMA notes reviewed and incorporated if applicable, Labs reviewed, Radiograph/ tests reviewed if applicable and OV notes from prior OV's with me, as well as any other specialists she/he has seen since seeing me last, were all reviewed and used in my  medical decision making process today.    - Additionally, when appropriate, discussion had with patient regarding our treatment plan, and their biases/concerns about that plan were used in my medical decision making today.    - The patient agreed with the plan and demonstrated an understanding of the instructions.   No barriers to understanding were identified.     - The patient was advised to call back or seek an in-person evaluation if the symptoms worsen or if the condition fails to improve as anticipated.   Return in about 3 months (around 12/26/2020) for ADHD management.    No orders of the defined types were placed in this encounter.   Meds ordered this encounter  Medications  . amphetamine-dextroamphetamine (ADDERALL XR) 20 MG 24 hr capsule    Sig: Take 1 capsule (20 mg total) by mouth every morning.    Dispense:  30 capsule    Refill:  0    Order Specific Question:   Supervising Provider    Answer:   Nani Gasser D [[id number]]    Medications Discontinued During This Encounter  Medication Reason  . amphetamine-dextroamphetamine (ADDERALL XR) 20 MG 24 hr capsule Reorder       Time spent on visit including pre-visit chart review and post-visit care was 15 minutes.      The 21st Century Cures Act was signed into law in 2016 which includes the topic of electronic health records.  This provides immediate access to information in MyChart.  This includes consultation notes, operative notes, office notes, lab results and pathology reports.  If you have any questions about what you read please let Carrie Reed know at your next visit or call Carrie Reed at the office.  We are right here with you.  Note:  This note was prepared with assistance of Dragon voice recognition software. Occasional wrong-word or sound-a-like substitutions may have occurred due to the inherent limitations of voice recognition  software.  __________________________________________________________________________________     Patient Care Team    Relationship Specialty Notifications Start End  Carrie Reed, [place] PCP - General Physician Assistant  05/30/20      -Vitals obtained; medications/ allergies reconciled;  personal medical, social, Sx etc.histories were updated by CMA, reviewed by me and are reflected in chart   Patient Active Problem List   Diagnosis Date Noted  . Lower extremity edema 07/05/2019  . Bulging of cervical intervertebral disc 12/24/2017  . Degeneration of lumbar intervertebral disc 12/16/2017  . Chronic low back pain 12/16/2017  . Acute maxillary sinusitis 11/11/2017  . Cough 11/11/2017  . Chronic RUQ pain 08/12/2017  . Healthcare maintenance 08/12/2017  . Intractable chronic migraine without aura and without status migrainosus 04/27/2016  . Migraine 08/02/2015  . Pulsatile tinnitus of left ear 08/02/2015  . Blurry vision 08/02/2015  . Worsening headaches 08/02/2015     Current Meds  Medication Sig  . amphetamine-dextroamphetamine (ADDERALL XR) 20 MG 24 hr capsule Take 1 capsule (20 mg total) by mouth every morning.  Marland Kitchen EMGALITY 120 MG/ML SOAJ  SMARTSIG:2 Milliliter(s) SUB-Q Once  . ibuprofen (ADVIL) 800 MG tablet Take 1 tablet (800 mg total) by mouth 3 (three) times daily.  . NURTEC 75 MG TBDP Take 1 tablet by mouth daily as needed.  . ondansetron (ZOFRAN) 4 MG tablet Take 4 mg by mouth every 8 (eight) hours as needed for nausea or vomiting.  . [DISCONTINUED] amphetamine-dextroamphetamine (ADDERALL XR) 20 MG 24 hr capsule Take 1 capsule (20 mg total) by mouth every morning.     Allergies:  Allergies  Allergen Reactions  . Propranolol Palpitations     ROS:  See above HPI for pertinent positives and negatives   Objective:   Blood pressure 124/81, pulse 85, temperature 97.9 F (36.6 C), temperature source Oral, height 5' 6.5" (1.689 m), weight 216 lb (98 kg), last  menstrual period 09/10/2020.  (if some vitals are omitted, this means that patient was UNABLE to obtain them even though they were asked to get them prior to OV today.  They were asked to call Carrie Reed at their earliest convenience with these once obtained. ) General: A & O * 3; sounds in no acute distress; Respiratory: speaking in full sentences, no conversational dyspnea; Psych: insight appears good, mood- appears full

## 2020-10-04 DIAGNOSIS — H93299 Other abnormal auditory perceptions, unspecified ear: Secondary | ICD-10-CM | POA: Diagnosis not present

## 2020-10-04 DIAGNOSIS — H93293 Other abnormal auditory perceptions, bilateral: Secondary | ICD-10-CM | POA: Diagnosis not present

## 2020-10-26 ENCOUNTER — Other Ambulatory Visit: Payer: Self-pay | Admitting: Physician Assistant

## 2020-10-26 ENCOUNTER — Telehealth: Payer: Self-pay | Admitting: Physician Assistant

## 2020-10-26 DIAGNOSIS — F909 Attention-deficit hyperactivity disorder, unspecified type: Secondary | ICD-10-CM

## 2020-10-26 MED ORDER — AMPHETAMINE-DEXTROAMPHET ER 20 MG PO CP24: 20.0000 mg | ORAL_CAPSULE | ORAL | 0 refills | Status: DC

## 2020-10-26 NOTE — Telephone Encounter (Signed)
Patient left message on voicemail to get a refill on her Adderall.  Please send to [facility].   amphetamine-dextroamphetamine (ADDERALL XR) 20 MG 24 hr capsule [[id number]]   Order Details Dose: 20 mg Route: Oral Frequency: BH-each morning  Dispense Quantity: 30 capsule Refills: 0       Sig: Take 1 capsule (20 mg total) by mouth every morning.      Start Date: 09/25/20 End Date: --  Written Date: 09/25/20 Expiration Date: 03/24/21  Earliest Fill Date: 09/25/20      Diagnosis Association: Adult ADHD (F90.9)  Original Order:  amphetamine-dextroamphetamine (ADDERALL XR) 20 MG 24 hr capsule [[id number]

## 2020-11-27 ENCOUNTER — Telehealth: Payer: Self-pay | Admitting: Physician Assistant

## 2020-11-27 ENCOUNTER — Ambulatory Visit (INDEPENDENT_AMBULATORY_CARE_PROVIDER_SITE_OTHER): Payer: BC Managed Care – PPO | Admitting: Physician Assistant

## 2020-11-27 ENCOUNTER — Other Ambulatory Visit: Payer: Self-pay

## 2020-11-27 ENCOUNTER — Encounter: Payer: Self-pay | Admitting: Physician Assistant

## 2020-11-27 DIAGNOSIS — F909 Attention-deficit hyperactivity disorder, unspecified type: Secondary | ICD-10-CM | POA: Diagnosis not present

## 2020-11-27 MED ORDER — AMPHETAMINE-DEXTROAMPHET ER 20 MG PO CP24: 20.0000 mg | ORAL_CAPSULE | ORAL | 0 refills | Status: DC

## 2020-11-27 NOTE — Progress Notes (Signed)
Telemedicine Visit via  Audio only - telephone (patient preference /  technical difficulty with MyChart video application)  I connected with Carrie Reed on 11/27/20 at 10:21 AM  by phone or  telemedicine application as noted above  I verified that I am speaking with or regarding  the correct patient using two identifiers.  Participants: Myself, Gena Fray PA-C Patient: Carrie Reed   Patient is in separate location from myself  I am in office at [facility]   I discussed the limitations of evaluation and management  by telemedicine and the availability of in person appointments.  The participant(s) above expressed understanding and  agreed to proceed with this appointment via telemedicine.       History of Present Illness: Carrie Reed is a 34 y.o. female who would like to discuss ADHD medication refill  She has been taking Adderall XR 20 mg daily as needed for concentration for school. On average she takes it 4-5 days per week. 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 apply for biochem/premed degree; reports she is doing well academically  Denies appetite suppression, mood changes, sleep disturbance/insomnia, chest pain, palpitations She is on Emgality and Nurtec for chronic migraine and has had only 2 headache days in the last 3 months   Depression screen [facility] 2/9 11/27/2020 09/25/2020 04/20/2020 07/19/2019 07/05/2019  Decreased Interest 0 0 0 0 0  Down, Depressed, Hopeless 0 0 0 0 0  PHQ - 2 Score 0 0 0 0 0  Altered sleeping 0 3 2 3 3   Tired, decreased energy 0 3 3 3 3   Change in appetite 1 - 2 2 1   Feeling bad or failure about yourself  0 1 0 0 0  Trouble concentrating 1 3 3  0 0  Moving slowly or fidgety/restless 0 2 0 0 0  Suicidal thoughts 0 0 0 0 0  PHQ-9 Score 2 12 10 8 7   Difficult doing work/chores Not difficult at all Somewhat  difficult Very difficult Very difficult Very difficult     Observations/Objective: Ht 5' 6.5" (1.689 m)   Wt 207 lb (93.9 kg)   LMP 11/10/2020 (Approximate)   BMI 32.91 kg/m  BP Readings from Last 3 Encounters:  09/25/20 124/81  08/14/20 121/83  04/20/20 120/81   Exam: Neuro: alert and oriented x 3 Psych: cooperative, euthymic mood, affect mood-congruent, speech is articulate, normal rate and volume; thought processes clear and goal-directed, normal judgment, good insight   Lab and Radiology Results No results found for this or any previous visit (from the past 72 hour(s)). No results found.     Assessment and Plan: 34 y.o. female with The encounter diagnosis was Adult ADHD.   PDMP reviewed during this encounter. No orders of the defined types were placed in this encounter.  Meds ordered this encounter  Medications  . amphetamine-dextroamphetamine (ADDERALL XR) 20 MG 24 hr capsule    Sig: Take 1 capsule (20 mg total) by mouth every morning.    Dispense:  30 capsule    Refill:  0    Order Specific Question:   Supervising Provider    Answer:   14/03/2020 09/27/20   There are no Patient Instructions on file for this visit.  Tolerating Adderall XR 20 mg daily without adverse effect She was unable to provide BP/HR today Recommend she monitor these at home and contact office for  Bps>130/80 and resting HR>100 She will call or use MyChart to schedule 3 month f/u  Follow Up Instructions: Return in about 3 months (around 02/25/2021).    I discussed the assessment and treatment plan with the patient. The patient was provided an opportunity to ask questions and all were answered. The patient agreed with the plan and demonstrated an understanding of the instructions.   The patient was advised to call back or seek an in-person evaluation if any new concerns, if symptoms worsen or if the condition fails to improve as anticipated.  9 minutes of non-face-to-face time  was provided during this encounter.      . . . . . . . . . . . . . Marland Kitchen                   Historical information moved to improve visibility of documentation.  Past Medical History:  Diagnosis Date  . Bulging of cervical intervertebral disc   . Degenerative disc disease, lumbar   . Migraine    Past Surgical History:  Procedure Laterality Date  . APPENDECTOMY    . CHOLECYSTECTOMY N/A 08/29/2019   Procedure: LAPAROSCOPIC CHOLECYSTECTOMY;  Surgeon: Berna Bue, MD;  Location: [facility];  Service: General;  Laterality: N/A;  . TONSILLECTOMY    . TONSILLECTOMY AND ADENOIDECTOMY     x2  . TUBAL LIGATION    . TYMPANOSTOMY TUBE PLACEMENT    . UTERINE TEAR     d/t late stage abortion   Social History   Tobacco Use  . Smoking status: Current Every Day Smoker    Packs/day: 0.25    Years: 12.00    Pack years: 3.00    Types: Cigarettes  . Smokeless tobacco: Never Used  Substance Use Topics  . Alcohol use: Yes    Alcohol/week: 0.0 standard drinks    Comment: social   family history includes Cancer in her paternal grandfather and sister; Cervical cancer in her sister; Cirrhosis in her mother; Clotting disorder in her brother and mother; Diabetes in her father, mother, and paternal grandmother; Heart attack in her father; Heart disease in her father; Heart failure in her father; High blood pressure in her brother; Hyperlipidemia in her father; Hypertension in her brother; Kidney disease in her mother; Stroke in her mother; Suicidality in her cousin, maternal uncle, and paternal aunt.  Medications: Current Outpatient Medications  Medication Sig Dispense Refill  . EMGALITY 120 MG/ML SOAJ SMARTSIG:2 Milliliter(s) SUB-Q Once    . ibuprofen (ADVIL) 800 MG tablet Take 1 tablet (800 mg total) by mouth 3 (three) times daily. 21 tablet 0  . NURTEC 75 MG TBDP Take 1 tablet by mouth daily as needed.    . ondansetron (ZOFRAN) 4 MG tablet Take 4 mg by mouth every 8  (eight) hours as needed for nausea or vomiting.    Marland Kitchen amphetamine-dextroamphetamine (ADDERALL XR) 20 MG 24 hr capsule Take 1 capsule (20 mg total) by mouth every morning. 30 capsule 0   No current facility-administered medications for this visit.   Allergies  Allergen Reactions  . Propranolol Palpitations

## 2020-11-27 NOTE — Telephone Encounter (Signed)
Called pt for telehealth appointment.  Information obtained.  Tiajuana Amass, CMA

## 2020-11-27 NOTE — Telephone Encounter (Signed)
Patient is returning your call,thanks. °

## 2020-12-18 DIAGNOSIS — G43009 Migraine without aura, not intractable, without status migrainosus: Secondary | ICD-10-CM | POA: Diagnosis not present

## 2020-12-18 DIAGNOSIS — G43709 Chronic migraine without aura, not intractable, without status migrainosus: Secondary | ICD-10-CM | POA: Diagnosis not present

## 2021-01-16 ENCOUNTER — Other Ambulatory Visit: Payer: Self-pay | Admitting: Physician Assistant

## 2021-01-16 DIAGNOSIS — F909 Attention-deficit hyperactivity disorder, unspecified type: Secondary | ICD-10-CM

## 2021-01-16 MED ORDER — AMPHETAMINE-DEXTROAMPHET ER 20 MG PO CP24: 20.0000 mg | ORAL_CAPSULE | ORAL | 0 refills | Status: DC

## 2021-01-16 NOTE — Telephone Encounter (Signed)
Patient last seen 11/27/20 by Gena Fray, PA-C and advised to follow up in 3 months. Last refill given 11/27/20 #30 no refills. AS, CMA

## 2021-02-25 ENCOUNTER — Telehealth: Payer: Self-pay | Admitting: Physician Assistant

## 2021-02-25 NOTE — Telephone Encounter (Signed)
Unable to give refill of controlled substance until patient follows up in office with provider for ADHD. Please see last AVS and schedule apt appropriately. AS, CMA

## 2021-02-27 ENCOUNTER — Encounter: Payer: Self-pay | Admitting: Physician Assistant

## 2021-02-27 ENCOUNTER — Ambulatory Visit (INDEPENDENT_AMBULATORY_CARE_PROVIDER_SITE_OTHER): Payer: BC Managed Care – PPO | Admitting: Physician Assistant

## 2021-02-27 VITALS — Ht 66.0 in | Wt 213.0 lb

## 2021-02-27 DIAGNOSIS — G43009 Migraine without aura, not intractable, without status migrainosus: Secondary | ICD-10-CM | POA: Diagnosis not present

## 2021-02-27 DIAGNOSIS — F909 Attention-deficit hyperactivity disorder, unspecified type: Secondary | ICD-10-CM | POA: Diagnosis not present

## 2021-02-27 DIAGNOSIS — K828 Other specified diseases of gallbladder: Secondary | ICD-10-CM | POA: Insufficient documentation

## 2021-02-27 DIAGNOSIS — R634 Abnormal weight loss: Secondary | ICD-10-CM | POA: Insufficient documentation

## 2021-02-27 DIAGNOSIS — R110 Nausea: Secondary | ICD-10-CM | POA: Insufficient documentation

## 2021-02-27 DIAGNOSIS — K760 Fatty (change of) liver, not elsewhere classified: Secondary | ICD-10-CM | POA: Insufficient documentation

## 2021-02-27 DIAGNOSIS — K5904 Chronic idiopathic constipation: Secondary | ICD-10-CM | POA: Insufficient documentation

## 2021-02-27 DIAGNOSIS — R635 Abnormal weight gain: Secondary | ICD-10-CM | POA: Insufficient documentation

## 2021-02-27 MED ORDER — AMPHETAMINE-DEXTROAMPHET ER 20 MG PO CP24: 20.0000 mg | ORAL_CAPSULE | ORAL | 0 refills | Status: DC

## 2021-02-27 NOTE — Patient Instructions (Signed)

## 2021-02-27 NOTE — Progress Notes (Signed)
Telehealth office visit note for Carrie Masker, PA-C- at Primary Care at [facility]   I connected with current patient today by telephone and verified that I am speaking with the correct person   . Location of the patient: Home . Location of the provider: Office - This visit type was conducted due to national recommendations for restrictions regarding the COVID-19 Pandemic (e.g. social distancing) in an effort to limit this patient's exposure and mitigate transmission in our community.    - No physical exam could be performed with this format, beyond that communicated to [place] by the patient/ family members as noted.   - Additionally my office staff/ schedulers were to discuss with the patient that there may be a monetary charge related to this service, depending on their medical insurance.  My understanding is that patient understood and consented to proceed.     _________________________________________________________________________________   History of Present Illness: Patient calls in to follow up on ADHD management. Taking medication without issues. States school is going good. Has enrolled in biochemistry/premed program at [employer] and will be starting in August. 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 Denies chest pain, palpitations, insomnia, or mood changes. Check BP and pulse at work which is usually 120/78-80, pulse 70-80. Reports is now going to the [facility] for migraine treatment due to her husband changing insurance again. Headaches are more manageable and last one was about 1 month ago.     GAD 7 : Generalized Anxiety Score 04/20/2020  Nervous, Anxious, on Edge 0  Control/stop worrying 1  Worry too much - different things 1  Trouble relaxing 1  Restless 1  Easily annoyed or irritable 3  Afraid - awful might happen 0  Total GAD 7 Score 7  Anxiety Difficulty Extremely difficult    Depression screen [facility] 2/9 02/27/2021  11/27/2020 09/25/2020 04/20/2020 07/19/2019  Decreased Interest 0 0 0 0 0  Down, Depressed, Hopeless 0 0 0 0 0  PHQ - 2 Score 0 0 0 0 0  Altered sleeping 0 0 3 2 3   Tired, decreased energy 3 0 3 3 3   Change in appetite 0 1 - 2 2  Feeling bad or failure about yourself  0 0 1 0 0  Trouble concentrating 1 1 3 3  0  Moving slowly or fidgety/restless 0 0 2 0 0  Suicidal thoughts 0 0 0 0 0  PHQ-9 Score 4 2 12 10 8   Difficult doing work/chores Somewhat difficult Not difficult at all Somewhat difficult Very difficult Very difficult      Impression and Recommendations:     1. Adult ADHD   2. Migraine without aura and without status migrainosus, not intractable     Adult ADHD: -Stable. -Continue current medication regimen. PDMP reviewed, no aberrancies noted. Provided refill. -Follow up in 3 months for medication management.   Migraine without aura and without status migrainosus,not intractable: -Continue to follow up with [facility].  -On Emaglity and Nurtec. -Recommend to avoid/reduce potential headache triggers.    - As part of my medical decision making, I reviewed the following data within the electronic MEDICAL RECORD NUMBER History obtained from pt /family, CMA notes reviewed and incorporated if applicable, Labs reviewed, Radiograph/ tests reviewed if applicable and OV notes from prior OV's with me, as well as any other specialists she/he has seen since seeing me last, were all reviewed and used in my medical decision making  process today.    - Additionally, when appropriate, discussion had with patient regarding our treatment plan, and their biases/concerns about that plan were used in my medical decision making today.    - The patient agreed with the plan and demonstrated an understanding of the instructions.   No barriers to understanding were identified.     - The patient was advised to call back or seek an in-person evaluation if the symptoms worsen or if the condition  fails to improve as anticipated.   Return in about 3 months (around 05/30/2021) for ADHD.    No orders of the defined types were placed in this encounter.   Meds ordered this encounter  Medications  . amphetamine-dextroamphetamine (ADDERALL XR) 20 MG 24 hr capsule    Sig: Take 1 capsule (20 mg total) by mouth every morning.    Dispense:  30 capsule    Refill:  0    Order Specific Question:   Supervising Provider    Answer:   Nani Gasser D [[id number]]    Medications Discontinued During This Encounter  Medication Reason  . amphetamine-dextroamphetamine (ADDERALL XR) 20 MG 24 hr capsule Reorder       Time spent on telephone encounter post-visit care was 9 minutes.   The 21st Century Cures Act was signed into law in 2016 which includes the topic of electronic health records.  This provides immediate access to information in MyChart.  This includes consultation notes, operative notes, office notes, lab results and pathology reports.  If you have any questions about what you read please let us know at your next visit or call us at the office.  We are right here with you.   __________________________________________________________________________________     Patient Care Team    Relationship Specialty Notifications Start End  Carrie Reed, [place] PCP - General Physician Assistant  05/30/20      -Vitals obtained; medications/ allergies reconciled;  personal medical, social, Sx etc.histories were updated by CMA, reviewed by me and are reflected in chart   Patient Active Problem List   Diagnosis Date Noted  . Abnormal weight gain 02/27/2021  . Abnormal weight loss 02/27/2021  . Biliary dyskinesia 02/27/2021  . Chronic idiopathic constipation 02/27/2021  . Fatty liver 02/27/2021  . Morbid obesity (HCC) 02/27/2021  . Nausea 02/27/2021  . Abnormal auditory perception 10/04/2020  . Migraine without aura and without status migrainosus, not intractable 07/23/2020  . Lower  extremity edema 07/05/2019  . Bulging of cervical intervertebral disc 12/24/2017  . Degeneration of lumbar intervertebral disc 12/16/2017  . Chronic low back pain 12/16/2017  . Acute maxillary sinusitis 11/11/2017  . Cough 11/11/2017  . Chronic RUQ pain 08/12/2017  . Healthcare maintenance 08/12/2017  . Intractable chronic migraine without aura and without status migrainosus 04/27/2016  . Migraine 08/02/2015  . Pulsatile tinnitus of left ear 08/02/2015  . Blurry vision 08/02/2015  . Worsening headaches 08/02/2015     Current Meds  Medication Sig  . EMGALITY 120 MG/ML SOAJ SMARTSIG:2 Milliliter(s) SUB-Q Once  . ibuprofen (ADVIL) 800 MG tablet Take 1 tablet (800 mg total) by mouth 3 (three) times daily.  . NURTEC 75 MG TBDP Take 1 tablet by mouth daily as needed.  . ondansetron (ZOFRAN) 4 MG tablet Take 4 mg by mouth every 8 (eight) hours as needed for nausea or vomiting.  . [DISCONTINUED] amphetamine-dextroamphetamine (ADDERALL XR) 20 MG 24 hr capsule Take 1 capsule (20 mg total) by mouth every morning.     Allergies:  Allergies  Allergen Reactions  . Propranolol Palpitations     ROS:  See above HPI for pertinent positives and negatives   Objective:   Height 5\' 6"  (1.676 m), weight 213 lb (96.6 kg), last menstrual period 02/24/2021.  (if some vitals are omitted, this means that patient was UNABLE to obtain them. ) General: A & O * 3; sounds in no acute distress Respiratory: speaking in full sentences, no conversational dyspnea Psych: insight appears good, mood- appears full

## 2021-04-01 ENCOUNTER — Other Ambulatory Visit: Payer: Self-pay | Admitting: Physician Assistant

## 2021-04-01 DIAGNOSIS — F909 Attention-deficit hyperactivity disorder, unspecified type: Secondary | ICD-10-CM

## 2021-04-01 MED ORDER — AMPHETAMINE-DEXTROAMPHET ER 20 MG PO CP24: 20.0000 mg | ORAL_CAPSULE | ORAL | 0 refills | Status: DC

## 2021-04-01 NOTE — Telephone Encounter (Signed)
Pt last seen 02/27/21 and advised to follow up in 3 months.   Last refill given 02/27/21 30 day supply.   Please approve if refill appropriate. AS, CMA

## 2021-04-15 ENCOUNTER — Ambulatory Visit (INDEPENDENT_AMBULATORY_CARE_PROVIDER_SITE_OTHER): Payer: BC Managed Care – PPO | Admitting: Nurse Practitioner

## 2021-04-15 ENCOUNTER — Other Ambulatory Visit: Payer: Self-pay

## 2021-04-15 ENCOUNTER — Encounter: Payer: Self-pay | Admitting: Nurse Practitioner

## 2021-04-15 VITALS — BP 116/76 | HR 77 | Temp 98.1°F | Ht 66.0 in | Wt 234.5 lb

## 2021-04-15 DIAGNOSIS — N6315 Unspecified lump in the right breast, overlapping quadrants: Secondary | ICD-10-CM | POA: Insufficient documentation

## 2021-04-15 NOTE — Progress Notes (Signed)
Established Patient Office Visit  Subjective:  Patient ID: Carrie Reed, female    DOB: Dec 27, 1985  Age: 35 y.o. MRN: [id number]  CC:  Chief Complaint  Patient presents with  . lump on breast    HPI Carrie Reed presents for evaluation of breast lump she noted two sundays ago while putting on her bra. She states that felt like she had a bruise on the underside of the breast. Could feel a palpable lump. She denies injury or trauma to the right breast. She states that there is no visible evidence of bruising or trauma to the breast. She denies redness or warmth in the effected area. She denies drainage present. She states that her last menstrual period was around 04/02/2021 and it was normal. She states that her breasts typically doget tender prior to her menstrual cycles, but tenderness is diffuse and not localized. She has not had a problem with her breasts in the past. She does not believe she has any family history of breast problems.   Past Medical History:  Diagnosis Date  . Bulging of cervical intervertebral disc   . Degenerative disc disease, lumbar   . Migraine     Past Surgical History:  Procedure Laterality Date  . APPENDECTOMY    . CHOLECYSTECTOMY N/A 08/29/2019   Procedure: LAPAROSCOPIC CHOLECYSTECTOMY;  Surgeon: Berna Bue, MD;  Location: [facility];  Service: General;  Laterality: N/A;  . TONSILLECTOMY    . TONSILLECTOMY AND ADENOIDECTOMY     x2  . TUBAL LIGATION    . TYMPANOSTOMY TUBE PLACEMENT    . UTERINE TEAR     d/t late stage abortion    Family History  Problem Relation Age of Onset  . Cirrhosis Mother   . Kidney disease Mother   . Diabetes Mother   . Stroke Mother   . Clotting disorder Mother   . Heart failure Father   . Heart disease Father   . Heart attack Father   . Diabetes Father   . Hyperlipidemia Father   . Cancer Sister        cervical cancer  . Hypertension Brother   . Clotting disorder Brother   . High blood pressure Brother   .  Cervical cancer Sister   . Suicidality Maternal Uncle   . Suicidality Paternal Aunt   . Diabetes Paternal Grandmother   . Cancer Paternal Grandfather        throat  . Suicidality Cousin   . Migraines Neg Hx     Social History   Socioeconomic History  . Marital status: Married    Spouse name: Jonny Ruiz  . Number of children: 4  . Years of education: Associates  . Highest education level: Not on file  Occupational History  . Occupation: Haematologist  Tobacco Use  . Smoking status: Current Every Day Smoker    Packs/day: 0.25    Years: 12.00    Pack years: 3.00    Types: Cigarettes  . Smokeless tobacco: Never Used  Vaping Use  . Vaping Use: Never used  Substance and Sexual Activity  . Alcohol use: Yes    Alcohol/week: 0.0 standard drinks    Comment: social  . Drug use: No  . Sexual activity: Yes  Other Topics Concern  . Not on file  Social History Narrative   Lives at home with husband and 4 children.   Caffeine use:  Drinks coffee (1/2 pot-1 pot every day)    Right-handed   Social Determinants  of Health   Financial Resource Strain: Not on file  Food Insecurity: Not on file  Transportation Needs: Not on file  Physical Activity: Not on file  Stress: Not on file  Social Connections: Not on file  Intimate Partner Violence: Not on file    Outpatient Medications Prior to Visit  Medication Sig Dispense Refill  . amphetamine-dextroamphetamine (ADDERALL XR) 20 MG 24 hr capsule Take 1 capsule (20 mg total) by mouth every morning. 30 capsule 0  . EMGALITY 120 MG/ML SOAJ SMARTSIG:2 Milliliter(s) SUB-Q Once    . ibuprofen (ADVIL) 800 MG tablet Take 1 tablet (800 mg total) by mouth 3 (three) times daily. 21 tablet 0  . NURTEC 75 MG TBDP Take 1 tablet by mouth daily as needed.    . ondansetron (ZOFRAN) 4 MG tablet Take 4 mg by mouth every 8 (eight) hours as needed for nausea or vomiting.     No facility-administered medications prior to visit.    Allergies  Allergen  Reactions  . Propranolol Palpitations    ROS Review of Systems  Constitutional: Negative for activity change, chills, fatigue and fever.  HENT: Negative for congestion, postnasal drip, rhinorrhea, sinus pressure and sinus pain.   Eyes: Negative.   Respiratory: Negative for cough, shortness of breath and wheezing.   Cardiovascular: Negative for chest pain and palpitations.  Gastrointestinal: Negative for constipation, diarrhea, nausea and vomiting.  Endocrine: Negative.   Genitourinary: Negative for menstrual problem.       Breast lump in the right lower breast. Tender to palpate.   Musculoskeletal: Negative for back pain and myalgias.  Skin: Negative for rash.  Allergic/Immunologic: Negative for environmental allergies.  Neurological: Negative for dizziness, weakness and numbness.  Hematological: Positive for adenopathy.       Has noted some mild lymph adenopathy in the right side of the neck.   Psychiatric/Behavioral: Negative for dysphoric mood and sleep disturbance. The patient is not nervous/anxious.   All other systems reviewed and are negative.     Objective:    Physical Exam Vitals and nursing note reviewed.  Constitutional:      Appearance: Normal appearance. She is well-developed.  HENT:     Head: Normocephalic and atraumatic.  Eyes:     Extraocular Movements: Extraocular movements intact.     Conjunctiva/sclera: Conjunctivae normal.     Pupils: Pupils are equal, round, and reactive to light.  Cardiovascular:     Rate and Rhythm: Normal rate and regular rhythm.     Pulses: Normal pulses.     Heart sounds: Normal heart sounds.  Pulmonary:     Effort: Pulmonary effort is normal.     Breath sounds: Normal breath sounds.  Chest:  Breasts:     Right: Mass and tenderness present.     Abdominal:     Palpations: Abdomen is soft.  Musculoskeletal:        General: Normal range of motion.     Cervical back: Normal range of motion and neck supple.  Skin:     General: Skin is warm and dry.     Capillary Refill: Capillary refill takes less than 2 seconds.  Neurological:     General: No focal deficit present.     Mental Status: She is alert and oriented to person, place, and time.  Psychiatric:        Mood and Affect: Mood normal.        Behavior: Behavior normal.        Thought Content: Thought content  normal.        Judgment: Judgment normal.     Today's Vitals   04/15/21 0843  BP: 116/76  Pulse: 77  Temp: 98.1 F (36.7 C)  SpO2: 99%  Weight: 234 lb 8 oz (106.4 kg)  Height: 5\' 6"  (1.676 m)   Body mass index is 37.85 kg/m.   Wt Readings from Last 3 Encounters:  04/15/21 234 lb 8 oz (106.4 kg)  02/27/21 213 lb (96.6 kg)  11/27/20 207 lb (93.9 kg)     Health Maintenance Due  Topic Date Due  . PAP SMEAR-Modifier  09/20/2017    There are no preventive care reminders to display for this patient.  Lab Results  Component Value Date   TSH 2.190 04/20/2020   Lab Results  Component Value Date   WBC 5.9 04/20/2020   HGB 13.7 04/20/2020   HCT 39.8 04/20/2020   MCV 91 04/20/2020   PLT 312 04/20/2020   Lab Results  Component Value Date   NA 138 04/20/2020   K 4.4 04/20/2020   CO2 24 04/20/2020   GLUCOSE 90 04/20/2020   BUN 10 04/20/2020   CREATININE 0.88 04/20/2020   BILITOT 0.4 04/20/2020   ALKPHOS 69 04/20/2020   AST 22 04/20/2020   ALT 27 04/20/2020   PROT 6.7 04/20/2020   ALBUMIN 4.4 04/20/2020   CALCIUM 9.6 04/20/2020   ANIONGAP 10 08/08/2019   Lab Results  Component Value Date   CHOL 195 12/03/2017   Lab Results  Component Value Date   HDL 45 12/03/2017   Lab Results  Component Value Date   LDLCALC 98 12/03/2017   Lab Results  Component Value Date   TRIG 262 (H) 12/03/2017   Lab Results  Component Value Date   CHOLHDL 4.3 12/03/2017   Lab Results  Component Value Date   HGBA1C 5.1 12/03/2017      Assessment & Plan:  1. Breast lump on right side at 6 o'clock position Will get targeted  ultrasound of the right breast, 6 o'clock region of the breast for further evaluation. Refer as indicated.  - 12/05/2017 BREAST LTD UNI RIGHT INC AXILLA; Future   Problem List Items Addressed This Visit      Other   Breast lump on right side at 6 o'clock position - Primary   Relevant Orders   US BREAST LTD UNI RIGHT INC AXILLA         [place], NP

## 2021-04-15 NOTE — Patient Instructions (Signed)
      Fibroadenoma  A fibroadenoma is a lump (tumor) in the breast. The lump is benign. This means that it is not cancer. It may move under your skin when you touch it. This kind of lump can grow in one breast or in both breasts. What are the causes? The cause of this condition is not known. What increases the risk?  Being a woman between the ages of 20 and 30.  Being a woman of African American descent. What are the signs or symptoms? Some lumps are too small to be felt. If you can feel it, it may feel like a lump that is:  Firm.  Round.  Smooth.  Able to move around a bit. How is this treated?  Regular breast exams are done to check for changes in the lump.  In some cases, the lump may be removed if: ? It is large. ? It keeps growing. ? It causes pain or changes in the skin of the breast. ? A young girl has a lump. Lumps in young girls tend to grow over time. Follow these instructions at home: Breast exams Check your breasts at home as told by your doctor. Report any changes or concerns. Check for the following:  The size of the lump.  The look and feel of the skin of your breasts.  The look and feel of your nipples.   General instructions  Do not smoke or use any products that contain nicotine or tobacco. These can further increase your cancer risk. If you need help quitting, ask your doctor.  Keep all follow-up visits. You will need breast exams on a regular basis. Contact a doctor if:  The lump changes in size or feels different.  The lump starts to be painful.  You find a new lump.  You have any changes in how the skin on your breast looks.  You have any changes in your nipple, such as: ? Fluid leaking from your nipple. ? Redness around your nipple. Summary  A fibroadenoma is a lump (tumor) in the breast. The lump is benign. This means that it is not cancer.  This may feel firm, round, or smooth, and it may move around a bit when touched. Some  lumps are too small to be felt.  Do breast exams at home. Watch for changes in the size of the lump.  Contact your doctor if the lump grows bigger or starts to cause pain. Also, let your doctor know if you have any changes in your nipple or in how the skin on your breast looks. This information is not intended to replace advice given to you by your health care provider. Make sure you discuss any questions you have with your health care provider. Document Revised: 09/24/2020 Document Reviewed: 09/24/2020 Elsevier Patient Education  2021 Elsevier Inc.  

## 2021-04-26 ENCOUNTER — Other Ambulatory Visit: Payer: Self-pay | Admitting: Nurse Practitioner

## 2021-04-26 DIAGNOSIS — N6310 Unspecified lump in the right breast, unspecified quadrant: Secondary | ICD-10-CM

## 2021-04-29 ENCOUNTER — Other Ambulatory Visit: Payer: BC Managed Care – PPO

## 2021-05-17 ENCOUNTER — Other Ambulatory Visit: Payer: Self-pay | Admitting: Physician Assistant

## 2021-05-17 ENCOUNTER — Telehealth: Payer: Self-pay | Admitting: Physician Assistant

## 2021-05-17 DIAGNOSIS — F909 Attention-deficit hyperactivity disorder, unspecified type: Secondary | ICD-10-CM

## 2021-05-17 MED ORDER — AMPHETAMINE-DEXTROAMPHET ER 20 MG PO CP24: 20.0000 mg | ORAL_CAPSULE | ORAL | 0 refills | Status: DC

## 2021-05-17 NOTE — Telephone Encounter (Signed)
Can you please reach out to patient to schedule a follow up appointment per last AVS from her [facility] visit. Should have been scheduled around 05/30/2021.

## 2021-05-17 NOTE — Telephone Encounter (Signed)
Patient requested a refill on Adderall and uses [facility] on [address], thanks.

## 2021-05-18 ENCOUNTER — Ambulatory Visit
Admission: RE | Admit: 2021-05-18 | Discharge: 2021-05-18 | Disposition: A | Discharge status: Home / Self Care | Payer: BC Managed Care – PPO | Source: Ambulatory Visit | Attending: Nurse Practitioner | Admitting: Nurse Practitioner

## 2021-05-18 ENCOUNTER — Other Ambulatory Visit: Payer: Self-pay

## 2021-05-18 DIAGNOSIS — N6315 Unspecified lump in the right breast, overlapping quadrants: Secondary | ICD-10-CM

## 2021-05-18 DIAGNOSIS — R922 Inconclusive mammogram: Secondary | ICD-10-CM | POA: Diagnosis not present

## 2021-05-18 DIAGNOSIS — N644 Mastodynia: Secondary | ICD-10-CM | POA: Diagnosis not present

## 2021-05-18 DIAGNOSIS — N6310 Unspecified lump in the right breast, unspecified quadrant: Secondary | ICD-10-CM

## 2021-05-20 NOTE — Progress Notes (Signed)
Negative mammogram

## 2021-06-04 ENCOUNTER — Other Ambulatory Visit: Payer: BC Managed Care – PPO

## 2021-06-06 ENCOUNTER — Other Ambulatory Visit: Payer: Self-pay

## 2021-06-06 ENCOUNTER — Ambulatory Visit (INDEPENDENT_AMBULATORY_CARE_PROVIDER_SITE_OTHER): Payer: BC Managed Care – PPO | Admitting: Physician Assistant

## 2021-06-06 ENCOUNTER — Encounter: Payer: Self-pay | Admitting: Physician Assistant

## 2021-06-06 VITALS — BP 128/87 | HR 88 | Temp 98.6°F | Ht 67.0 in | Wt 202.6 lb

## 2021-06-06 DIAGNOSIS — F909 Attention-deficit hyperactivity disorder, unspecified type: Secondary | ICD-10-CM | POA: Diagnosis not present

## 2021-06-06 NOTE — Progress Notes (Signed)
Established Patient Office Visit  Subjective:  Patient ID: Carrie Reed, female    DOB: [birth year]  Age: 35 y.o. MRN: [id number]  CC:  Chief Complaint  Patient presents with   ADHD    HPI Mallarie Voorhies presents for follow up on ADHD management. 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 Is enjoying her new position. Takes medication when working or has lots to do at home. Is planning to start school at [employer] in August. Denies chest pain, palpitations, sleep disturbance or mood changes. Also reports has lost weight since starting her new position. Wants to lose more weight.   Past Medical History:  Diagnosis Date   Bulging of cervical intervertebral disc    Degenerative disc disease, lumbar    Migraine     Past Surgical History:  Procedure Laterality Date   APPENDECTOMY     CHOLECYSTECTOMY N/A 08/29/2019   Procedure: LAPAROSCOPIC CHOLECYSTECTOMY;  Surgeon: Berna Bue, MD;  Location: [facility];  Service: General;  Laterality: N/A;   TONSILLECTOMY     TONSILLECTOMY AND ADENOIDECTOMY     x2   TUBAL LIGATION     TYMPANOSTOMY TUBE PLACEMENT     UTERINE TEAR     d/t late stage abortion    Family History  Problem Relation Age of Onset   Cirrhosis Mother    Kidney disease Mother    Diabetes Mother    Stroke Mother    Clotting disorder Mother    Heart failure Father    Heart disease Father    Heart attack Father    Diabetes Father    Hyperlipidemia Father    Cancer Sister        cervical cancer   Hypertension Brother    Clotting disorder Brother    High blood pressure Brother    Cervical cancer Sister    Suicidality Maternal Uncle    Suicidality Paternal Aunt    Diabetes Paternal Grandmother    Cancer Paternal Grandfather        throat   Suicidality Cousin    Migraines Neg Hx     Social History   Socioeconomic  History   Marital status: Married    Spouse name: John   Number of children: 4   Years of education: Associates   Highest education level: Not on file  Occupational History   Occupation: Haematologist  Tobacco Use   Smoking status: Every Day    Packs/day: 0.25    Years: 12.00    Pack years: 3.00    Types: Cigarettes   Smokeless tobacco: Never  Vaping Use   Vaping Use: Never used  Substance and Sexual Activity   Alcohol use: Yes    Alcohol/week: 0.0 standard drinks    Comment: social   Drug use: No   Sexual activity: Yes  Other Topics Concern   Not on file  Social History Narrative   Lives at home with husband and 4 children.   Caffeine use:  Drinks coffee (1/2 pot-1 pot every day)    Right-handed   Social Determinants of Health   Financial Resource Strain: Not on file  Food Insecurity: Not on file  Transportation Needs: Not on file  Physical Activity: Not on file  Stress: Not on file  Social Connections: Not on file  Intimate Partner Violence: Not on file  Outpatient Medications Prior to Visit  Medication Sig Dispense Refill   amphetamine-dextroamphetamine (ADDERALL XR) 20 MG 24 hr capsule Take 1 capsule (20 mg total) by mouth every morning. 30 capsule 0   ibuprofen (ADVIL) 800 MG tablet Take 1 tablet (800 mg total) by mouth 3 (three) times daily. 21 tablet 0   ondansetron (ZOFRAN) 4 MG tablet Take 4 mg by mouth every 8 (eight) hours as needed for nausea or vomiting.     EMGALITY 120 MG/ML SOAJ SMARTSIG:2 Milliliter(s) SUB-Q Once (Patient not taking: Reported on 06/06/2021)     NURTEC 75 MG TBDP Take 1 tablet by mouth daily as needed. (Patient not taking: Reported on 06/06/2021)     No facility-administered medications prior to visit.    Allergies  Allergen Reactions   Propranolol Palpitations    ROS Review of Systems Review of Systems:  A fourteen system review of systems was performed and found to be positive as per HPI.   Objective:    Physical  Exam General:  Well Developed, well nourished, in no acute distress  Neuro:  Alert and oriented,  extra-ocular muscles intact  HEENT:  Normocephalic, atraumatic, neck supple Skin:  no gross rash, warm, pink. Cardiac:  RRR Respiratory:  ECTA B/L, Not using accessory muscles, speaking in full sentences- unlabored. Vascular:  Ext warm, no cyanosis apprec.; cap RF less 2 sec. Psych:  No HI/SI, judgement and insight good, Euthymic mood. Full Affect.  BP 128/87   Pulse 88   Temp 98.6 F (37 C)   Ht 5\' 7"  (1.702 m)   Wt 202 lb 9.6 oz (91.9 kg)   LMP  (LMP Unknown)   SpO2 100%   BMI 31.73 kg/m  Wt Readings from Last 3 Encounters:  06/06/21 202 lb 9.6 oz (91.9 kg)  04/15/21 234 lb 8 oz (106.4 kg)  02/27/21 213 lb (96.6 kg)     Health Maintenance Due  Topic Date Due   COVID-19 Vaccine (1) Never done   Pneumococcal Vaccine 2-3 Years old (1 - PCV) Never done   PAP SMEAR-Modifier  09/20/2017    There are no preventive care reminders to display for this patient.  Lab Results  Component Value Date   TSH 2.190 04/20/2020   Lab Results  Component Value Date   WBC 5.9 04/20/2020   HGB 13.7 04/20/2020   HCT 39.8 04/20/2020   MCV 91 04/20/2020   PLT 312 04/20/2020   Lab Results  Component Value Date   NA 138 04/20/2020   K 4.4 04/20/2020   CO2 24 04/20/2020   GLUCOSE 90 04/20/2020   BUN 10 04/20/2020   CREATININE 0.88 04/20/2020   BILITOT 0.4 04/20/2020   ALKPHOS 69 04/20/2020   AST 22 04/20/2020   ALT 27 04/20/2020   PROT 6.7 04/20/2020   ALBUMIN 4.4 04/20/2020   CALCIUM 9.6 04/20/2020   ANIONGAP 10 08/08/2019   Lab Results  Component Value Date   CHOL 195 12/03/2017   Lab Results  Component Value Date   HDL 45 12/03/2017   Lab Results  Component Value Date   LDLCALC 98 12/03/2017   Lab Results  Component Value Date   TRIG 262 (H) 12/03/2017   Lab Results  Component Value Date   CHOLHDL 4.3 12/03/2017   Lab Results  Component Value Date   HGBA1C  5.1 12/03/2017      Assessment & Plan:   Problem List Items Addressed This Visit   None Visit Diagnoses  Adult ADHD    -  Primary      Adult ADHD: -Stable. -Continue current medication regimen. BP and pulse stable. -PDMP reviewed, no aberrancies noted. Adderall XR 20 mg filled 05/17/2021. Not due for refill yet. -Will continue to monitor.  No orders of the defined types were placed in this encounter.   Follow-up: Return in about 4 months (around 10/06/2021) for CPE and FBW.   Note:  This note was prepared with assistance of Dragon voice recognition software. Occasional wrong-word or sound-a-like substitutions may have occurred due to the inherent limitations of voice recognition software.  [name], PA-C

## 2021-06-18 DIAGNOSIS — G43709 Chronic migraine without aura, not intractable, without status migrainosus: Secondary | ICD-10-CM | POA: Diagnosis not present

## 2021-07-03 ENCOUNTER — Other Ambulatory Visit: Payer: Self-pay | Admitting: Physician Assistant

## 2021-07-03 DIAGNOSIS — F909 Attention-deficit hyperactivity disorder, unspecified type: Secondary | ICD-10-CM

## 2021-07-03 MED ORDER — AMPHETAMINE-DEXTROAMPHET ER 20 MG PO CP24: 20.0000 mg | ORAL_CAPSULE | ORAL | 0 refills | Status: DC

## 2021-07-03 NOTE — Telephone Encounter (Signed)
Patient last seen 05/2021. Advised to follow up in October. Pt requested med refill. As, CMA

## 2021-08-19 ENCOUNTER — Other Ambulatory Visit: Payer: Self-pay | Admitting: Physician Assistant

## 2021-08-19 DIAGNOSIS — F909 Attention-deficit hyperactivity disorder, unspecified type: Secondary | ICD-10-CM

## 2021-08-19 MED ORDER — AMPHETAMINE-DEXTROAMPHET ER 20 MG PO CP24: 20.0000 mg | ORAL_CAPSULE | ORAL | 0 refills | Status: DC

## 2021-08-19 NOTE — Telephone Encounter (Signed)
Patient last seen 06/06/21 for ADHD and advised to follow up in 4 months for CPE.   Patient requesting refill of Adderall, last prescribed on 07/03/2021

## 2021-09-26 ENCOUNTER — Other Ambulatory Visit: Payer: Self-pay | Admitting: Physician Assistant

## 2021-09-26 DIAGNOSIS — F909 Attention-deficit hyperactivity disorder, unspecified type: Secondary | ICD-10-CM

## 2021-09-30 ENCOUNTER — Ambulatory Visit (INDEPENDENT_AMBULATORY_CARE_PROVIDER_SITE_OTHER): Payer: BC Managed Care – PPO | Admitting: Physician Assistant

## 2021-09-30 ENCOUNTER — Encounter: Payer: Self-pay | Admitting: Physician Assistant

## 2021-09-30 VITALS — Ht 66.0 in | Wt 197.0 lb

## 2021-09-30 DIAGNOSIS — F909 Attention-deficit hyperactivity disorder, unspecified type: Secondary | ICD-10-CM | POA: Diagnosis not present

## 2021-09-30 MED ORDER — AMPHETAMINE-DEXTROAMPHET ER 20 MG PO CP24: 20.0000 mg | ORAL_CAPSULE | ORAL | 0 refills | Status: DC

## 2021-09-30 NOTE — Progress Notes (Signed)
Telehealth office visit note for Carrie Masker, PA-C- at Primary Care at [facility]   I connected with current patient today by telephone and verified that I am speaking with the correct person    Location of the patient: Home  Location of the provider: Office - This visit type was conducted due to national recommendations for restrictions regarding the COVID-19 Pandemic (e.g. social distancing) in an effort to limit this patient's exposure and mitigate transmission in our community.    - No physical exam could be performed with this format, beyond that communicated to [place] by the patient/ family members as noted.   - Additionally my office staff/ schedulers were to discuss with the patient that there may be a monetary charge related to this service, depending on their medical insurance.  My understanding is that patient understood and consented to proceed.     _________________________________________________________________________________   History of Present Illness: Patient calls in to follow up on ADHD management. Reports usually takes medication during the week and takes drug holiday on the weekends. Is currently taking 4 classes and for next semester will be taking 6 classes. Currently has 2 statistic classes which she be completing soon. Medication has helped with focusing and finishing assignments. Also works full time at the ED. Denies chest pain, palpitations, sleep disturbance, or appetite changes. Patient currently under increased stress with the unexpected death of her mother-in-law.    GAD 7 : Generalized Anxiety Score 09/30/2021 06/06/2021 04/20/2020  Nervous, Anxious, on Edge 0 0 0  Control/stop worrying 0 0 1  Worry too much - different things 0 0 1  Trouble relaxing 0 0 1  Restless 0 0 1  Easily annoyed or irritable 0 0 3  Afraid - awful might happen 0 0 0  Total GAD 7 Score 0 0 7  Anxiety Difficulty Not difficult at all - Extremely difficult    Depression  screen [facility] 2/9 09/30/2021 06/06/2021 04/15/2021 02/27/2021 11/27/2020  Decreased Interest 0 0 0 0 0  Down, Depressed, Hopeless 0 0 0 0 0  PHQ - 2 Score 0 0 0 0 0  Altered sleeping 0 1 1 0 0  Tired, decreased energy 0 1 1 3  0  Change in appetite 0 0 0 0 1  Feeling bad or failure about yourself  0 0 0 0 0  Trouble concentrating 0 1 0 1 1  Moving slowly or fidgety/restless 0 0 0 0 0  Suicidal thoughts 0 0 0 0 0  PHQ-9 Score 0 3 2 4 2   Difficult doing work/chores - Somewhat difficult - Somewhat difficult Not difficult at all  Some recent data might be hidden      Impression and Recommendations:     1. Adult ADHD   -Stable. -Continue current medication regimen. PDMP reviewed, no aberrancies noted. Provided refill. -Will continue to monitor.      - As part of my medical decision making, I reviewed the following data within the electronic MEDICAL RECORD NUMBER History obtained from pt /family, CMA notes reviewed and incorporated if applicable, Labs reviewed, Radiograph/ tests reviewed if applicable and OV notes from prior OV's with me, as well as any other specialists she/he has seen since seeing me last, were all reviewed and used in my medical decision making process today.    - Additionally, when appropriate, discussion had with patient regarding our treatment plan, and their biases/concerns about that plan were used in my medical decision making today.    -  The patient agreed with the plan and demonstrated an understanding of the instructions.   No barriers to understanding were identified.     - The patient was advised to call back or seek an in-person evaluation if the symptoms worsen or if the condition fails to improve as anticipated.   Return for as scheduled.    No orders of the defined types were placed in this encounter.   Meds ordered this encounter  Medications   amphetamine-dextroamphetamine (ADDERALL XR) 20 MG 24 hr capsule    Sig: Take 1 capsule (20 mg total) by mouth  every morning.    Dispense:  30 capsule    Refill:  0    Order Specific Question:   Supervising Provider    Answer:   Nani Gasser D [[id number]]     Medications Discontinued During This Encounter  Medication Reason   amphetamine-dextroamphetamine (ADDERALL XR) 20 MG 24 hr capsule Reorder       Time spent on telephone encounter was 10 minutes.      The 21st Century Cures Act was signed into law in 2016 which includes the topic of electronic health records.  This provides immediate access to information in MyChart.  This includes consultation notes, operative notes, office notes, lab results and pathology reports.  If you have any questions about what you read please let us know at your next visit or call us at the office.  We are right here with you.   __________________________________________________________________________________     Patient Care Team    Relationship Specialty Notifications Start End  Carrie Reed, [place] PCP - General Physician Assistant  05/30/20      -Vitals obtained; medications/ allergies reconciled;  personal medical, social, Sx etc.histories were updated by CMA, reviewed by me and are reflected in chart   Patient Active Problem List   Diagnosis Date Noted   Breast lump on right side at 6 o'clock position 04/15/2021   Abnormal weight gain 02/27/2021   Abnormal weight loss 02/27/2021   Biliary dyskinesia 02/27/2021   Chronic idiopathic constipation 02/27/2021   Fatty liver 02/27/2021   Morbid obesity (HCC) 02/27/2021   Nausea 02/27/2021   Abnormal auditory perception 10/04/2020   Migraine without aura and without status migrainosus, not intractable 07/23/2020   Lower extremity edema 07/05/2019   Bulging of cervical intervertebral disc 12/24/2017   Degeneration of lumbar intervertebral disc 12/16/2017   Chronic low back pain 12/16/2017   Acute maxillary sinusitis 11/11/2017   Cough 11/11/2017   Chronic RUQ pain 08/12/2017   Healthcare  maintenance 08/12/2017   Intractable chronic migraine without aura and without status migrainosus 04/27/2016   Migraine 08/02/2015   Pulsatile tinnitus of left ear 08/02/2015   Blurry vision 08/02/2015   Worsening headaches 08/02/2015     Current Meds  Medication Sig   ibuprofen (ADVIL) 800 MG tablet Take 1 tablet (800 mg total) by mouth 3 (three) times daily.   ondansetron (ZOFRAN) 4 MG tablet Take 4 mg by mouth every 8 (eight) hours as needed for nausea or vomiting.   SUMAtriptan (IMITREX) 100 MG tablet One tablet at the onset of headache.  May repeat in two hours if the headache pain persists.  No more than 2 tablets in a 24 hour period.   [DISCONTINUED] amphetamine-dextroamphetamine (ADDERALL XR) 20 MG 24 hr capsule Take 1 capsule (20 mg total) by mouth every morning.     Allergies:  Allergies  Allergen Reactions   Propranolol Palpitations     ROS:  See above HPI for pertinent positives and negatives   Objective:   Height 5\' 6"  (1.676 m), weight 197 lb (89.4 kg).  (if some vitals are omitted, this means that patient was UNABLE to obtain them.) General: A & O * 3; sounds in no acute distress; in usual state of health.  Respiratory: speaking in full sentences, no conversational dyspnea Psych: insight appears good, mood- appears full

## 2021-10-11 ENCOUNTER — Ambulatory Visit (INDEPENDENT_AMBULATORY_CARE_PROVIDER_SITE_OTHER): Payer: BC Managed Care – PPO | Admitting: Physician Assistant

## 2021-10-11 ENCOUNTER — Other Ambulatory Visit: Payer: Self-pay

## 2021-10-11 ENCOUNTER — Encounter: Payer: Self-pay | Admitting: Physician Assistant

## 2021-10-11 VITALS — BP 109/75 | HR 81 | Temp 97.8°F | Ht 66.0 in | Wt 206.6 lb

## 2021-10-11 DIAGNOSIS — Z0000 Encounter for general adult medical examination without abnormal findings: Secondary | ICD-10-CM

## 2021-10-11 DIAGNOSIS — E049 Nontoxic goiter, unspecified: Secondary | ICD-10-CM | POA: Diagnosis not present

## 2021-10-11 NOTE — Patient Instructions (Signed)

## 2021-10-11 NOTE — Progress Notes (Signed)
Subjective:     Carrie Reed is a 35 y.o. female and is here for a comprehensive physical exam. The patient reports no problems.  Social History   Socioeconomic History   Marital status: Married    Spouse name: John   Number of children: 4   Years of education: Associates   Highest education level: Not on file  Occupational History   Occupation: Haematologist  Tobacco Use   Smoking status: Former    Packs/day: 0.25    Years: 12.00    Pack years: 3.00    Types: Cigarettes    Quit date: 09/09/2021    Years since quitting: 0.0   Smokeless tobacco: Never  Vaping Use   Vaping Use: Never used  Substance and Sexual Activity   Alcohol use: Yes    Alcohol/week: 0.0 standard drinks    Comment: social   Drug use: No   Sexual activity: Yes  Other Topics Concern   Not on file  Social History Narrative   Lives at home with husband and 4 children.   Caffeine use:  Drinks coffee (1/2 pot-1 pot every day)    Right-handed   Social Determinants of Health   Financial Resource Strain: Not on file  Food Insecurity: Not on file  Transportation Needs: Not on file  Physical Activity: Not on file  Stress: Not on file  Social Connections: Not on file  Intimate Partner Violence: Not on file   Health Maintenance  Topic Date Due   COVID-19 Vaccine (1) Never done   Pneumococcal Vaccine 70-43 Years old (1 - PCV) Never done   PAP SMEAR-Modifier  09/20/2017   INFLUENZA VACCINE  Never done   Hepatitis C Screening  04/15/2022 (Originally 06/11/2002)   TETANUS/TDAP  12/08/2021   HIV Screening  Completed   HPV VACCINES  Aged Out    The following portions of the patient's history were reviewed and updated as appropriate: allergies, current medications, past family history, past medical history, past social history, past surgical history, and problem list.  Review of Systems Pertinent items noted in HPI and remainder of comprehensive ROS otherwise negative.   Objective:    BP 109/75    Pulse 81   Temp 97.8 F (36.6 C)   Ht 5\' 6"  (1.676 m)   Wt 206 lb 9.6 oz (93.7 kg)   LMP 10/04/2021   SpO2 98%   BMI 33.35 kg/m  General appearance: alert, cooperative, and no distress Head: Normocephalic, without obvious abnormality, atraumatic Eyes: conjunctivae/corneas clear. PERRL, EOM's intact. Fundi benign. Ears:  scar tissue of right ear, ear tube of left ear noted Nose: Nares normal. Septum midline. Mucosa normal. No drainage or sinus tenderness. Throat: lips, mucosa, and tongue normal; teeth and gums normal Neck: no adenopathy, supple, symmetrical, trachea midline, and thyroid: enlarged  Back: symmetric, no curvature. ROM normal. No CVA tenderness. Lungs: clear to auscultation bilaterally Heart: regular rate and rhythm and S1, S2 normal Abdomen: soft, non-tender; bowel sounds normal; no masses,  no organomegaly Pelvic: deferred Extremities: extremities normal, atraumatic, no cyanosis or edema Pulses: 2+ and symmetric Skin: Skin color, texture, turgor normal. No rashes or lesions Lymph nodes: Cervical adenopathy: normal and Supraclavicular adenopathy: normal Neurologic: Grossly normal    Assessment:    Healthy female exam.    Plan:  -Patient deferred pap.  -Recommend to schedule lab visit for fasting blood work. -Recommend repeating thyroid ultrasound if symptoms of globus sensation fails to improve or worsen. Patient verbalized understanding.  -Reports  UTD on Tdap, obtained vaccine 5 years ago post injury. -Obtained influenza vaccine at work 09/04/21. Exempted from Covid vaccine. -Recommend to follow a heart healthy diet and reduce snacks. Continue to stay as active as possible. -Follow up in 3 months for ADHD/medication management.    See After Visit Summary for Counseling Recommendations

## 2021-11-05 ENCOUNTER — Other Ambulatory Visit: Payer: Self-pay

## 2021-11-05 DIAGNOSIS — Z0000 Encounter for general adult medical examination without abnormal findings: Secondary | ICD-10-CM

## 2021-11-05 DIAGNOSIS — Z1321 Encounter for screening for nutritional disorder: Secondary | ICD-10-CM

## 2021-11-05 DIAGNOSIS — Z130 Encounter for screening for diseases of the blood and blood-forming organs and certain disorders involving the immune mechanism: Secondary | ICD-10-CM

## 2021-11-05 DIAGNOSIS — R5383 Other fatigue: Secondary | ICD-10-CM

## 2021-11-06 ENCOUNTER — Other Ambulatory Visit: Payer: BC Managed Care – PPO

## 2021-11-06 ENCOUNTER — Other Ambulatory Visit: Payer: Self-pay

## 2021-11-06 DIAGNOSIS — G43909 Migraine, unspecified, not intractable, without status migrainosus: Secondary | ICD-10-CM | POA: Diagnosis not present

## 2021-11-06 DIAGNOSIS — K760 Fatty (change of) liver, not elsewhere classified: Secondary | ICD-10-CM | POA: Diagnosis not present

## 2021-11-06 DIAGNOSIS — Z1329 Encounter for screening for other suspected endocrine disorder: Secondary | ICD-10-CM | POA: Diagnosis not present

## 2021-11-06 DIAGNOSIS — R5383 Other fatigue: Secondary | ICD-10-CM

## 2021-11-06 DIAGNOSIS — Z130 Encounter for screening for diseases of the blood and blood-forming organs and certain disorders involving the immune mechanism: Secondary | ICD-10-CM

## 2021-11-06 DIAGNOSIS — Z0000 Encounter for general adult medical examination without abnormal findings: Secondary | ICD-10-CM | POA: Diagnosis not present

## 2021-11-06 DIAGNOSIS — Z1321 Encounter for screening for nutritional disorder: Secondary | ICD-10-CM

## 2021-11-07 LAB — COMPREHENSIVE METABOLIC PANEL
ALT: 25 IU/L (ref 0–32)
AST: 23 IU/L (ref 0–40)
Albumin/Globulin Ratio: 2.1 (ref 1.2–2.2)
Albumin: 4.6 g/dL (ref 3.8–4.8)
Alkaline Phosphatase: 77 IU/L (ref 44–121)
BUN/Creatinine Ratio: 11 (ref 9–23)
BUN: 9 mg/dL (ref 6–20)
Bilirubin Total: 0.3 mg/dL (ref 0.0–1.2)
CO2: 23 mmol/L (ref 20–29)
Calcium: 9.7 mg/dL (ref 8.7–10.2)
Chloride: 101 mmol/L (ref 96–106)
Creatinine, Ser: 0.8 mg/dL (ref 0.57–1.00)
Globulin, Total: 2.2 g/dL (ref 1.5–4.5)
Glucose: 85 mg/dL (ref 70–99)
Potassium: 4 mmol/L (ref 3.5–5.2)
Sodium: 138 mmol/L (ref 134–144)
Total Protein: 6.8 g/dL (ref 6.0–8.5)
eGFR: 97 mL/min/{1.73_m2} (ref >59)

## 2021-11-07 LAB — CBC WITH DIFFERENTIAL/PLATELET
Basophils Absolute: 0.1 10*3/uL (ref 0.0–0.2)
Basos: 1 %
EOS (ABSOLUTE): 0.1 10*3/uL (ref 0.0–0.4)
Eos: 2 %
Hematocrit: 36.9 % (ref 34.0–46.6)
Hemoglobin: 12.8 g/dL (ref 11.1–15.9)
Immature Grans (Abs): 0 10*3/uL (ref 0.0–0.1)
Immature Granulocytes: 0 %
Lymphocytes Absolute: 2.7 10*3/uL (ref 0.7–3.1)
Lymphs: 43 %
MCH: 31.1 pg (ref 26.6–33.0)
MCHC: 34.7 g/dL (ref 31.5–35.7)
MCV: 90 fL (ref 79–97)
Monocytes Absolute: 0.3 10*3/uL (ref 0.1–0.9)
Monocytes: 5 %
Neutrophils Absolute: 3 10*3/uL (ref 1.4–7.0)
Neutrophils: 49 %
Platelets: 351 10*3/uL (ref 150–450)
RBC: 4.12 x10E6/uL (ref 3.77–5.28)
RDW: 12.1 % (ref 11.7–15.4)
WBC: 6.2 10*3/uL (ref 3.4–10.8)

## 2021-11-07 LAB — LIPID PANEL
Chol/HDL Ratio: 3.5 ratio (ref 0.0–4.4)
Cholesterol, Total: 177 mg/dL (ref 100–199)
HDL: 51 mg/dL (ref >39)
LDL Chol Calc (NIH): 99 mg/dL (ref 0–99)
Triglycerides: 157 mg/dL — ABNORMAL HIGH (ref 0–149)
VLDL Cholesterol Cal: 27 mg/dL (ref 5–40)

## 2021-11-07 LAB — HEMOGLOBIN A1C
Est. average glucose Bld gHb Est-mCnc: 105 mg/dL
Hgb A1c MFr Bld: 5.3 % (ref 4.8–5.6)

## 2021-11-07 LAB — TSH: TSH: 1.55 u[IU]/mL (ref 0.450–4.500)

## 2021-11-08 ENCOUNTER — Other Ambulatory Visit: Payer: Self-pay

## 2021-11-08 ENCOUNTER — Other Ambulatory Visit: Payer: Self-pay | Admitting: Physician Assistant

## 2021-11-08 DIAGNOSIS — F909 Attention-deficit hyperactivity disorder, unspecified type: Secondary | ICD-10-CM

## 2021-11-08 MED ORDER — AMPHETAMINE-DEXTROAMPHET ER 20 MG PO CP24: 20.0000 mg | ORAL_CAPSULE | ORAL | 0 refills | Status: DC

## 2021-12-16 ENCOUNTER — Other Ambulatory Visit: Payer: Self-pay | Admitting: Physician Assistant

## 2021-12-16 ENCOUNTER — Telehealth: Payer: Self-pay | Admitting: Physician Assistant

## 2021-12-16 DIAGNOSIS — F909 Attention-deficit hyperactivity disorder, unspecified type: Secondary | ICD-10-CM

## 2021-12-16 MED ORDER — AMPHETAMINE-DEXTROAMPHET ER 20 MG PO CP24: 20.0000 mg | ORAL_CAPSULE | ORAL | 0 refills | Status: DC

## 2021-12-16 NOTE — Telephone Encounter (Signed)
Patient left a vm requesting a refill of her Adderall. [phone]

## 2021-12-17 NOTE — Telephone Encounter (Signed)
Left message for patient

## 2022-01-22 NOTE — Progress Notes (Signed)
Telehealth office visit note for Carrie Masker, PA-C- at Primary Care at [facility]   I connected with current patient today by telephone and verified that I am speaking with the correct person    Location of the patient: Home  Location of the provider: Office - This visit type was conducted due to national recommendations for restrictions regarding the COVID-19 Pandemic (e.g. social distancing) in an effort to limit this patient's exposure and mitigate transmission in our community.    - No physical exam could be performed with this format, beyond that communicated to [place] by the patient/ family members as noted.   - Additionally my office staff/ schedulers were to discuss with the patient that there may be a monetary charge related to this service, depending on their medical insurance.  My understanding is that patient understood and consented to proceed.     _________________________________________________________________________________   History of Present Illness: Patient calls in to follow-up on ADHD management.  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  Reports has been unable to change her work schedule due to under staffing.  Reports usually takes Adderall extended release 20 mg when she has class IV on the weekends when she has a lot of schoolwork to catch up on.  Denies chest pain, palpitations, mood or appetite changes.  States occasionally will have trouble sleeping if she takes the medication too late but tries her best not to take it so late.  Has no concerns with school or work performance.     GAD 7 : Generalized Anxiety Score 10/11/2021 09/30/2021 06/06/2021 04/20/2020  Nervous, Anxious, on Edge 0 0 0 0  Control/stop worrying 0 0 0 1  Worry too much - different things 0 0 0 1  Trouble relaxing 0 0 0 1  Restless 0 0 0 1  Easily annoyed or irritable 0 0 0 3  Afraid - awful might happen 0 0  0 0  Total GAD 7 Score 0 0 0 7  Anxiety Difficulty - Not difficult at all - Extremely difficult    Depression screen [facility] 2/9 01/23/2022 10/11/2021 09/30/2021 06/06/2021 04/15/2021  Decreased Interest 0 0 0 0 0  Down, Depressed, Hopeless 0 0 0 0 0  PHQ - 2 Score 0 0 0 0 0  Altered sleeping 0 1 0 1 1  Tired, decreased energy 3 1 0 1 1  Change in appetite 0 0 0 0 0  Feeling bad or failure about yourself  0 0 0 0 0  Trouble concentrating 0 1 0 1 0  Moving slowly or fidgety/restless 0 0 0 0 0  Suicidal thoughts 0 0 0 0 0  PHQ-9 Score 3 3 0 3 2  Difficult doing work/chores Not difficult at all - - Somewhat difficult -  Some recent data might be hidden      Impression and Recommendations:     1. Adult ADHD   -Stable. -PDMP reviewed, no aberrancies noted.  Provided refill. -Follow-up in 3 months for medication management.    - As part of my medical decision making, I reviewed the following data within the electronic MEDICAL RECORD NUMBER History obtained from pt /family, CMA notes reviewed and incorporated if applicable, Labs reviewed, Radiograph/ tests reviewed if applicable and OV notes from prior OV's with me, as well as any other specialists she/he has seen since seeing me last, were all  reviewed and used in my medical decision making process today.    - Additionally, when appropriate, discussion had with patient regarding our treatment plan, and their biases/concerns about that plan were used in my medical decision making today.    - The patient agreed with the plan and demonstrated an understanding of the instructions.   No barriers to understanding were identified.     - The patient was advised to call back or seek an in-person evaluation if the symptoms worsen or if the condition fails to improve as anticipated.   Return in about 3 months (around 04/22/2022) for ADHD Telehealth .    No orders of the defined types were placed in this encounter.   Meds ordered this encounter   Medications   amphetamine-dextroamphetamine (ADDERALL XR) 20 MG 24 hr capsule    Sig: Take 1 capsule (20 mg total) by mouth every morning.    Dispense:  30 capsule    Refill:  0    Order Specific Question:   Supervising Provider    Answer:   Nani Gasser D [[id number]]    Medications Discontinued During This Encounter  Medication Reason   amphetamine-dextroamphetamine (ADDERALL XR) 20 MG 24 hr capsule Reorder       Time spent on telephone encounter was 10 minutes.   Note:  This note was prepared with assistance of Dragon voice recognition software. Occasional wrong-word or sound-a-like substitutions may have occurred due to the inherent limitations of voice recognition software.    The 21st Century Cures Act was signed into law in 2016 which includes the topic of electronic health records.  This provides immediate access to information in MyChart.  This includes consultation notes, operative notes, office notes, lab results and pathology reports.  If you have any questions about what you read please let us know at your next visit or call us at the office.  We are right here with you.   __________________________________________________________________________________     Patient Care Team    Relationship Specialty Notifications Start End  Carrie Reed, [place] PCP - General Physician Assistant  05/30/20      -Vitals obtained; medications/ allergies reconciled;  personal medical, social, Sx etc.histories were updated by CMA, reviewed by me and are reflected in chart   Patient Active Problem List   Diagnosis Date Noted   Breast lump on right side at 6 o'clock position 04/15/2021   Abnormal weight gain 02/27/2021   Abnormal weight loss 02/27/2021   Biliary dyskinesia 02/27/2021   Chronic idiopathic constipation 02/27/2021   Fatty liver 02/27/2021   Morbid obesity (HCC) 02/27/2021   Nausea 02/27/2021   Abnormal auditory perception 10/04/2020   Migraine without aura  and without status migrainosus, not intractable 07/23/2020   Lower extremity edema 07/05/2019   Bulging of cervical intervertebral disc 12/24/2017   Degeneration of lumbar intervertebral disc 12/16/2017   Chronic low back pain 12/16/2017   Acute maxillary sinusitis 11/11/2017   Cough 11/11/2017   Chronic RUQ pain 08/12/2017   Healthcare maintenance 08/12/2017   Intractable chronic migraine without aura and without status migrainosus 04/27/2016   Migraine 08/02/2015   Pulsatile tinnitus of left ear 08/02/2015   Blurry vision 08/02/2015   Worsening headaches 08/02/2015     Current Meds  Medication Sig   ibuprofen (ADVIL) 800 MG tablet Take 1 tablet (800 mg total) by mouth 3 (three) times daily.   NURTEC 75 MG TBDP Take 1 tablet by mouth daily as needed.   ondansetron (ZOFRAN) 4 MG  tablet Take 4 mg by mouth every 8 (eight) hours as needed for nausea or vomiting.   SUMAtriptan (IMITREX) 100 MG tablet One tablet at the onset of headache.  May repeat in two hours if the headache pain persists.  No more than 2 tablets in a 24 hour period.   [DISCONTINUED] amphetamine-dextroamphetamine (ADDERALL XR) 20 MG 24 hr capsule Take 1 capsule (20 mg total) by mouth every morning.     Allergies:  Allergies  Allergen Reactions   Propranolol Palpitations     ROS:  See above HPI for pertinent positives and negatives   Objective:   Blood pressure 134/82, pulse 80, height 5\' 6"  (1.676 m), weight 196 lb 12.2 oz (89.3 kg).  (if some vitals are omitted, this means that patient was UNABLE to obtain them.) General: A & O * 3; sounds in no acute distress; in usual state of health.  Respiratory: speaking in full sentences, no conversational dyspnea Psych: insight appears good, mood- appears full

## 2022-01-23 ENCOUNTER — Encounter: Payer: Self-pay | Admitting: Physician Assistant

## 2022-01-23 ENCOUNTER — Other Ambulatory Visit: Payer: Self-pay

## 2022-01-23 ENCOUNTER — Ambulatory Visit (INDEPENDENT_AMBULATORY_CARE_PROVIDER_SITE_OTHER): Payer: BC Managed Care – PPO | Admitting: Physician Assistant

## 2022-01-23 DIAGNOSIS — F909 Attention-deficit hyperactivity disorder, unspecified type: Secondary | ICD-10-CM | POA: Diagnosis not present

## 2022-01-23 MED ORDER — AMPHETAMINE-DEXTROAMPHET ER 20 MG PO CP24: 20.0000 mg | ORAL_CAPSULE | ORAL | 0 refills | Status: DC

## 2022-02-24 ENCOUNTER — Encounter: Payer: Self-pay | Admitting: Physician Assistant

## 2022-03-11 ENCOUNTER — Telehealth: Payer: Self-pay | Admitting: Physician Assistant

## 2022-03-11 ENCOUNTER — Other Ambulatory Visit: Payer: Self-pay | Admitting: Physician Assistant

## 2022-03-11 DIAGNOSIS — F909 Attention-deficit hyperactivity disorder, unspecified type: Secondary | ICD-10-CM

## 2022-03-11 MED ORDER — AMPHETAMINE-DEXTROAMPHET ER 20 MG PO CP24: 20.0000 mg | ORAL_CAPSULE | ORAL | 0 refills | Status: DC

## 2022-03-11 NOTE — Telephone Encounter (Signed)
Patient is requesting a refill of the Adderall. Please advise. [phone] ?

## 2022-03-17 ENCOUNTER — Telehealth: Payer: Self-pay | Admitting: Physician Assistant

## 2022-03-17 DIAGNOSIS — F909 Attention-deficit hyperactivity disorder, unspecified type: Secondary | ICD-10-CM

## 2022-03-17 NOTE — Telephone Encounter (Signed)
Patient cannot find a pharmacy that has the Adderall and is asking if it can be changed to something that will not be on back order? Please advise. [phone] ?

## 2022-03-17 NOTE — Telephone Encounter (Signed)
Called patient and advised them to reach out to insurance company and see what medication replacements they allow. Patient was agreeable and will callback to let us know which medication to call in.  ?

## 2022-03-18 NOTE — Telephone Encounter (Signed)
Patient did not get anywhere with insurance company they told her to contact our office back as we should know what to prescribe. [phone] ?

## 2022-03-20 MED ORDER — METHYLPHENIDATE HCL ER (LA) 10 MG PO CP24: 10.0000 mg | ORAL_CAPSULE | Freq: Every day | ORAL | 0 refills | Status: DC

## 2022-06-26 ENCOUNTER — Other Ambulatory Visit (HOSPITAL_BASED_OUTPATIENT_CLINIC_OR_DEPARTMENT_OTHER): Payer: Self-pay

## 2022-06-27 ENCOUNTER — Encounter (HOSPITAL_BASED_OUTPATIENT_CLINIC_OR_DEPARTMENT_OTHER): Payer: Self-pay | Admitting: Pharmacist

## 2022-06-27 ENCOUNTER — Other Ambulatory Visit (HOSPITAL_BASED_OUTPATIENT_CLINIC_OR_DEPARTMENT_OTHER): Payer: Self-pay

## 2022-10-07 ENCOUNTER — Other Ambulatory Visit (HOSPITAL_BASED_OUTPATIENT_CLINIC_OR_DEPARTMENT_OTHER): Payer: Self-pay

## 2022-10-07 MED ORDER — INFLUENZA VAC SPLIT QUAD 0.5 ML IM SUSY
PREFILLED_SYRINGE | INTRAMUSCULAR | 0 refills | Status: DC
  Filled 2022-10-07: qty 0.5, 1d supply, fill #0

## 2022-10-15 ENCOUNTER — Encounter: Payer: Self-pay | Admitting: Physician Assistant

## 2022-10-15 ENCOUNTER — Ambulatory Visit (INDEPENDENT_AMBULATORY_CARE_PROVIDER_SITE_OTHER): Payer: No Typology Code available for payment source | Admitting: Physician Assistant

## 2022-10-15 VITALS — BP 131/95 | HR 92 | Resp 20 | Ht 66.0 in | Wt 211.0 lb

## 2022-10-15 DIAGNOSIS — Z0000 Encounter for general adult medical examination without abnormal findings: Secondary | ICD-10-CM

## 2022-10-15 DIAGNOSIS — E049 Nontoxic goiter, unspecified: Secondary | ICD-10-CM | POA: Diagnosis not present

## 2022-10-15 NOTE — Progress Notes (Signed)
Complete physical exam   Patient: Carrie Reed   DOB: 06-Oct-1986   36 y.o. Female  MRN: [id number] Visit Date: 10/15/2022   Chief Complaint  Patient presents with   Annual Exam    Non-Fasting   Subjective    Carrie Reed is a 36 y.o. female who presents today for a complete physical exam.  She reports consuming a general diet.  Patient has a busy schedule with work and school.  She generally feels fairly well. She does have additional problems to discuss today- feels like there is lump in her throat especially at night, has noticed choking on liquids more frequently.    Past Medical History:  Diagnosis Date   Bulging of cervical intervertebral disc    Degenerative disc disease, lumbar    Migraine    Past Surgical History:  Procedure Laterality Date   APPENDECTOMY     CHOLECYSTECTOMY N/A 08/29/2019   Procedure: LAPAROSCOPIC CHOLECYSTECTOMY;  Surgeon: Clovis Riley, MD;  Location: [facility];  Service: General;  Laterality: N/A;   TONSILLECTOMY     TONSILLECTOMY AND ADENOIDECTOMY     x2   TUBAL LIGATION     TYMPANOSTOMY TUBE PLACEMENT     UTERINE TEAR     d/t late stage abortion   Social History   Socioeconomic History   Marital status: Married    Spouse name: John   Number of children: 4   Years of education: Associates   Highest education level: Not on file  Occupational History   Occupation: Teacher, adult education  Tobacco Use   Smoking status: Former    Packs/day: 0.25    Years: 12.00    Total pack years: 3.00    Types: Cigarettes    Quit date: 09/09/2021    Years since quitting: 1.0   Smokeless tobacco: Never  Vaping Use   Vaping Use: Never used  Substance and Sexual Activity   Alcohol use: Yes    Alcohol/week: 0.0 standard drinks of alcohol    Comment: social   Drug use: No   Sexual activity: Yes  Other Topics Concern   Not on file  Social History Narrative   Lives at home with husband and 4 children.   Caffeine use:  Drinks coffee (1/2 pot-1 pot  every day)    Right-handed   Social Determinants of Health   Financial Resource Strain: Not on file  Food Insecurity: Not on file  Transportation Needs: Not on file  Physical Activity: Not on file  Stress: Not on file  Social Connections: Not on file  Intimate Partner Violence: Not on file     Medications: Outpatient Medications Prior to Visit  Medication Sig   ibuprofen (ADVIL) 800 MG tablet Take 1 tablet (800 mg total) by mouth 3 (three) times daily.   influenza vac split quadrivalent PF (FLUARIX) 0.5 ML injection Inject into the muscle.   NURTEC 75 MG TBDP Take 1 tablet by mouth daily as needed.   ondansetron (ZOFRAN) 4 MG tablet Take 4 mg by mouth every 8 (eight) hours as needed for nausea or vomiting.   SUMAtriptan (IMITREX) 100 MG tablet One tablet at the onset of headache.  May repeat in two hours if the headache pain persists.  No more than 2 tablets in a 24 hour period.   [DISCONTINUED] amphetamine-dextroamphetamine (ADDERALL XR) 20 MG 24 hr capsule Take 1 capsule (20 mg total) by mouth every morning.   [DISCONTINUED] methylphenidate (RITALIN LA) 10 MG 24 hr capsule Take 1  capsule (10 mg total) by mouth daily.   No facility-administered medications prior to visit.    Review of Systems Review of Systems:  A fourteen system review of systems was performed and found to be positive as per HPI.  Last CBC Lab Results  Component Value Date   WBC 6.2 11/06/2021   HGB 12.8 11/06/2021   HCT 36.9 11/06/2021   MCV 90 11/06/2021   MCH 31.1 11/06/2021   RDW 12.1 11/06/2021   PLT 351 41/66/0630   Last metabolic panel Lab Results  Component Value Date   GLUCOSE 85 11/06/2021   NA 138 11/06/2021   K 4.0 11/06/2021   CL 101 11/06/2021   CO2 23 11/06/2021   BUN 9 11/06/2021   CREATININE 0.80 11/06/2021   EGFR 97 11/06/2021   CALCIUM 9.7 11/06/2021   PROT 6.8 11/06/2021   ALBUMIN 4.6 11/06/2021   LABGLOB 2.2 11/06/2021   AGRATIO 2.1 11/06/2021   BILITOT 0.3 11/06/2021    ALKPHOS 77 11/06/2021   AST 23 11/06/2021   ALT 25 11/06/2021   ANIONGAP 10 08/08/2019   Last lipids Lab Results  Component Value Date   CHOL 177 11/06/2021   HDL 51 11/06/2021   LDLCALC 99 11/06/2021   TRIG 157 (H) 11/06/2021   CHOLHDL 3.5 11/06/2021   Last hemoglobin A1c Lab Results  Component Value Date   HGBA1C 5.3 11/06/2021   Last thyroid functions Lab Results  Component Value Date   TSH 1.550 11/06/2021   T4TOTAL 6.0 01/04/2016   Last vitamin D Lab Results  Component Value Date   VD25OH 30.1 04/20/2020      Objective    BP (!) 131/95 (BP Location: Left Arm, Patient Position: Sitting, Cuff Size: Large)   Pulse 92   Resp 20   Ht _0  (1.676 m)   Wt 211 lb (95.7 kg)   LMP 10/06/2022   SpO2 97%   BMI 34.06 kg/m  BP Readings from Last 3 Encounters:  10/15/22 (!) 131/95  01/23/22 134/82  10/11/21 109/75   Wt Readings from Last 3 Encounters:  10/15/22 211 lb (95.7 kg)  01/23/22 196 lb 12.2 oz (89.3 kg)  10/11/21 206 lb 9.6 oz (93.7 kg)      Physical Exam   General Appearance:    Alert, cooperative, in no acute distress, appears stated age   Head:    Normocephalic, without obvious abnormality, atraumatic  Eyes:    PERRL, conjunctiva/corneas clear, EOM's intact, fundi    benign, both eyes  Ears:    Normal TM's and external ear canals, both ears  Nose:   Nares normal, septum midline, mucosa normal, no drainage    or sinus tenderness  Throat:   Lips, mucosa, and tongue normal; teeth and gums normal  Neck:   Supple, symmetrical, trachea midline, no adenopathy;    thyroid:  +mild enlargement, no tenderness/nodules; no JVD  Back:     Symmetric, no curvature, ROM normal, no CVA tenderness  Lungs:     Clear to auscultation bilaterally, respirations unlabored  Chest Wall:    No tenderness or deformity   Heart:    Normal heart rate. Normal rhythm. No murmurs, rubs, or gallops.   Breast Exam:    deferred  Abdomen:     Soft, non-tender, bowel sounds  active all four quadrants,    no masses, no organomegaly  Pelvic:    deferred  Extremities:   All extremities are intact. No cyanosis or edema  Pulses:  2+ and symmetric all extremities  Skin:   Skin color, texture, turgor normal, no rashes or suspicious lesions  Lymph nodes:   Cervical, supraclavicular nodes normal  Neurologic:   CNII-XII grossly intact.     Last depression screening scores    10/15/2022    9:44 AM 01/23/2022    8:02 AM 10/11/2021    8:15 AM  PHQ 2/9 Scores  PHQ - 2 Score 0 0 0  PHQ- 9 Score _0 Last fall risk screening    10/15/2022    9:45 AM  Fall Risk   Falls in the past year? 0  Number falls in past yr: 0  Injury with Fall? 0     No results found for any visits on 10/15/22.  Assessment & Plan    Routine Health Maintenance and Physical Exam  Exercise Activities and Dietary recommendations -A heart healthy diet low in fat and carbohydrates. Recommend moderate exercise 150 mins/wk.  Immunization History  Administered Date(s) Administered   Influenza-Unspecified 09/04/2021, 10/07/2022    Health Maintenance  Topic Date Due   COVID-19 Vaccine (1) Never done   Hepatitis C Screening  Never done   PAP SMEAR-Modifier  09/20/2017   TETANUS/TDAP  12/08/2021   INFLUENZA VACCINE  Completed   HIV Screening  Completed   HPV VACCINES  Aged Out    Discussed health benefits of physical activity, and encouraged her to engage in regular exercise appropriate for her age and condition.  Problem List Items Addressed This Visit       Other   Healthcare maintenance - Primary   Other Visit Diagnoses     Thyroid enlarged          Pt deferred pap. Advised to schedule lab visit for routine fasting labs. UTD flu vaccine. Discussed with patient recommend repeating thyroid ultrasound to evaluate for changes with right lobe and/or referral to GI evaluate for narrowed esophagus. Pt deferred at this time. Follow-up in 1 year for CPE and FBW or sooner if  needed.  Return in about 1 year (around 10/16/2023) for CPE and FBW; lab visit next Monday morning for FBW.       Lorrene Reid, PA-C  [facility] at [facility] [phone] (phone) [phone] (fax)  [place]

## 2022-10-15 NOTE — Patient Instructions (Signed)

## 2022-10-16 ENCOUNTER — Other Ambulatory Visit: Payer: Self-pay

## 2022-10-16 DIAGNOSIS — Z0000 Encounter for general adult medical examination without abnormal findings: Secondary | ICD-10-CM

## 2022-10-16 DIAGNOSIS — Z130 Encounter for screening for diseases of the blood and blood-forming organs and certain disorders involving the immune mechanism: Secondary | ICD-10-CM

## 2022-10-16 DIAGNOSIS — E049 Nontoxic goiter, unspecified: Secondary | ICD-10-CM

## 2022-10-20 ENCOUNTER — Other Ambulatory Visit: Payer: No Typology Code available for payment source

## 2022-10-20 DIAGNOSIS — Z130 Encounter for screening for diseases of the blood and blood-forming organs and certain disorders involving the immune mechanism: Secondary | ICD-10-CM

## 2022-10-20 DIAGNOSIS — E049 Nontoxic goiter, unspecified: Secondary | ICD-10-CM

## 2022-10-20 DIAGNOSIS — Z0000 Encounter for general adult medical examination without abnormal findings: Secondary | ICD-10-CM

## 2022-10-21 LAB — CBC WITH DIFFERENTIAL/PLATELET
Basophils Absolute: 0 10*3/uL (ref 0.0–0.2)
Basos: 1 %
EOS (ABSOLUTE): 0.2 10*3/uL (ref 0.0–0.4)
Eos: 3 %
Hematocrit: 38.4 % (ref 34.0–46.6)
Hemoglobin: 13.3 g/dL (ref 11.1–15.9)
Immature Grans (Abs): 0 10*3/uL (ref 0.0–0.1)
Immature Granulocytes: 0 %
Lymphocytes Absolute: 1.8 10*3/uL (ref 0.7–3.1)
Lymphs: 37 %
MCH: 31.4 pg (ref 26.6–33.0)
MCHC: 34.6 g/dL (ref 31.5–35.7)
MCV: 91 fL (ref 79–97)
Monocytes Absolute: 0.3 10*3/uL (ref 0.1–0.9)
Monocytes: 6 %
Neutrophils Absolute: 2.6 10*3/uL (ref 1.4–7.0)
Neutrophils: 53 %
Platelets: 302 10*3/uL (ref 150–450)
RBC: 4.24 x10E6/uL (ref 3.77–5.28)
RDW: 12.5 % (ref 11.7–15.4)
WBC: 4.8 10*3/uL (ref 3.4–10.8)

## 2022-10-21 LAB — COMPREHENSIVE METABOLIC PANEL
ALT: 23 IU/L (ref 0–32)
AST: 20 IU/L (ref 0–40)
Albumin/Globulin Ratio: 1.8 (ref 1.2–2.2)
Albumin: 4.2 g/dL (ref 3.9–4.9)
Alkaline Phosphatase: 62 IU/L (ref 44–121)
BUN/Creatinine Ratio: 13 (ref 9–23)
BUN: 10 mg/dL (ref 6–20)
Bilirubin Total: 0.3 mg/dL (ref 0.0–1.2)
CO2: 18 mmol/L — ABNORMAL LOW (ref 20–29)
Calcium: 9.2 mg/dL (ref 8.7–10.2)
Chloride: 104 mmol/L (ref 96–106)
Creatinine, Ser: 0.8 mg/dL (ref 0.57–1.00)
Globulin, Total: 2.3 g/dL (ref 1.5–4.5)
Glucose: 90 mg/dL (ref 70–99)
Potassium: 4.5 mmol/L (ref 3.5–5.2)
Sodium: 138 mmol/L (ref 134–144)
Total Protein: 6.5 g/dL (ref 6.0–8.5)
eGFR: 97 mL/min/{1.73_m2} (ref >59)

## 2022-10-21 LAB — LIPID PANEL
Chol/HDL Ratio: 3.6 ratio (ref 0.0–4.4)
Cholesterol, Total: 172 mg/dL (ref 100–199)
HDL: 48 mg/dL (ref >39)
LDL Chol Calc (NIH): 87 mg/dL (ref 0–99)
Triglycerides: 224 mg/dL — ABNORMAL HIGH (ref 0–149)
VLDL Cholesterol Cal: 37 mg/dL (ref 5–40)

## 2022-10-21 LAB — TSH: TSH: 1.99 u[IU]/mL (ref 0.450–4.500)

## 2022-10-21 LAB — HEMOGLOBIN A1C
Est. average glucose Bld gHb Est-mCnc: 100 mg/dL
Hgb A1c MFr Bld: 5.1 % (ref 4.8–5.6)

## 2022-11-14 ENCOUNTER — Other Ambulatory Visit (HOSPITAL_BASED_OUTPATIENT_CLINIC_OR_DEPARTMENT_OTHER): Payer: Self-pay

## 2022-12-16 ENCOUNTER — Other Ambulatory Visit (HOSPITAL_BASED_OUTPATIENT_CLINIC_OR_DEPARTMENT_OTHER): Payer: Self-pay

## 2022-12-16 DIAGNOSIS — M5136 Other intervertebral disc degeneration, lumbar region: Secondary | ICD-10-CM | POA: Diagnosis not present

## 2022-12-16 DIAGNOSIS — M5030 Other cervical disc degeneration, unspecified cervical region: Secondary | ICD-10-CM | POA: Diagnosis not present

## 2022-12-16 MED ORDER — PREDNISONE 10 MG PO TABS
ORAL_TABLET | ORAL | 0 refills | Status: DC
  Filled 2022-12-16: qty 21, 6d supply, fill #0

## 2023-01-02 DIAGNOSIS — M5451 Vertebrogenic low back pain: Secondary | ICD-10-CM | POA: Diagnosis not present

## 2023-02-10 DIAGNOSIS — M5136 Other intervertebral disc degeneration, lumbar region: Secondary | ICD-10-CM | POA: Diagnosis not present

## 2023-03-11 ENCOUNTER — Other Ambulatory Visit (HOSPITAL_BASED_OUTPATIENT_CLINIC_OR_DEPARTMENT_OTHER): Payer: Self-pay

## 2023-03-11 ENCOUNTER — Encounter: Payer: Self-pay | Admitting: Family Medicine

## 2023-03-11 ENCOUNTER — Ambulatory Visit (INDEPENDENT_AMBULATORY_CARE_PROVIDER_SITE_OTHER): Payer: 59 | Admitting: Family Medicine

## 2023-03-11 VITALS — BP 121/81 | HR 85 | Resp 18 | Ht 66.0 in | Wt 220.0 lb

## 2023-03-11 DIAGNOSIS — F909 Attention-deficit hyperactivity disorder, unspecified type: Secondary | ICD-10-CM

## 2023-03-11 DIAGNOSIS — G4700 Insomnia, unspecified: Secondary | ICD-10-CM | POA: Diagnosis not present

## 2023-03-11 MED ORDER — AMPHETAMINE-DEXTROAMPHET ER 20 MG PO CP24: 20.0000 mg | ORAL_CAPSULE | Freq: Every day | ORAL | 0 refills | Status: DC

## 2023-03-11 MED ORDER — AMPHETAMINE-DEXTROAMPHET ER 20 MG PO CP24
20.0000 mg | ORAL_CAPSULE | Freq: Every day | ORAL | 0 refills | Status: DC
  Filled 2023-03-11: qty 30, 30d supply, fill #0

## 2023-03-11 MED ORDER — MIRTAZAPINE 15 MG PO TABS
ORAL_TABLET | ORAL | 1 refills | Status: DC
  Filled 2023-03-11: qty 30, 30d supply, fill #0

## 2023-03-11 MED ORDER — AMPHETAMINE-DEXTROAMPHET ER 20 MG PO CP24
20.0000 mg | ORAL_CAPSULE | Freq: Every day | ORAL | 0 refills | Status: DC
  Filled 2023-05-08: qty 30, 30d supply, fill #0

## 2023-03-11 MED ORDER — MIRTAZAPINE 15 MG PO TABS: ORAL_TABLET | ORAL | 1 refills | Status: DC

## 2023-03-11 NOTE — Assessment & Plan Note (Signed)
Starting trial of mirtazapine 7.5 to 15 mg daily at bedtime to help with falling asleep and decreasing racing thoughts.  Follow-up in about 1 month to see how it is going and adjust management as needed.

## 2023-03-11 NOTE — Progress Notes (Signed)
Acute Office Visit  Subjective:     Patient ID: Carrie Reed, female    DOB: Apr 14, 1986, 37 y.o.   MRN: [id number]  Chief Complaint  Patient presents with   ADHD         HPI Patient is in today to discuss restarting Adderall.  She previously took Adderall and found that it was very helpful since she is going back to school while still working nights full-time.  She stopped Adderall last summer because it was increasing her blood pressure slightly.  The past couple of semesters she has really struggled not taking the Adderall, and it is also affecting her mood and her sleep at this point.  Denies chest pain, palpitations, headache.  ROS See HPI     Objective:    BP 121/81 (BP Location: Left Arm, Patient Position: Sitting, Cuff Size: Normal)   Pulse 85   Resp 18   Ht 5\' 6"  (1.676 m)   Wt 220 lb (99.8 kg)   SpO2 97%   BMI 35.51 kg/m   Physical Exam Constitutional:      General: She is not in acute distress.    Appearance: Normal appearance.  HENT:     Head: Normocephalic and atraumatic.  Cardiovascular:     Rate and Rhythm: Normal rate and regular rhythm.     Heart sounds: Normal heart sounds. No murmur heard.    No friction rub. No gallop.  Pulmonary:     Effort: Pulmonary effort is normal. No respiratory distress.     Breath sounds: No wheezing, rhonchi or rales.  Skin:    General: Skin is warm and dry.  Neurological:     Mental Status: She is alert and oriented to person, place, and time.  Psychiatric:        Mood and Affect: Mood normal.       03/11/2023    2:51 PM 10/15/2022    9:44 AM 01/23/2022    8:02 AM  Depression screen PHQ 2/9  Decreased Interest 0 0 0  Down, Depressed, Hopeless 0 0 0  PHQ - 2 Score 0 0 0  Altered sleeping 1 3 0  Tired, decreased energy 3 3 3   Change in appetite 3 1 0  Feeling bad or failure about yourself  0 0 0  Trouble concentrating 3 3 0  Moving slowly or fidgety/restless 3 0 0  Suicidal thoughts 0 0 0  PHQ-9 Score 13 10  3   Difficult doing work/chores Very difficult Somewhat difficult Not difficult at all      03/11/2023    2:51 PM 10/15/2022    9:44 AM 10/11/2021    8:15 AM 09/30/2021    2:40 PM  GAD 7 : Generalized Anxiety Score  Nervous, Anxious, on Edge 0 0 0 0  Control/stop worrying 0 0 0 0  Worry too much - different things 1 0 0 0  Trouble relaxing 1 1 0 0  Restless 1 3 0 0  Easily annoyed or irritable 1 1 0 0  Afraid - awful might happen 0 0 0 0  Total GAD 7 Score 4 5 0 0  Anxiety Difficulty Somewhat difficult Somewhat difficult  Not difficult at all     Assessment & Plan:  Attention deficit hyperactivity disorder (ADHD), unspecified ADHD type Assessment & Plan: Restart Adderall 20 mg daily to improve focus and help with school.  Will follow-up in 1 month to ensure that dose is appropriate and there are no adverse effects.  Orders: -     Amphetamine-Dextroamphet ER; Take 1 capsule (20 mg total) by mouth daily.  Dispense: 30 capsule; Refill: 0 -     Amphetamine-Dextroamphet ER; Take 1 capsule (20 mg total) by mouth daily.  Dispense: 30 capsule; Refill: 0 -     Amphetamine-Dextroamphet ER; Take 1 capsule (20 mg total) by mouth daily.  Dispense: 30 capsule; Refill: 0  Insomnia, unspecified type Assessment & Plan: Starting trial of mirtazapine 7.5 to 15 mg daily at bedtime to help with falling asleep and decreasing racing thoughts.  Follow-up in about 1 month to see how it is going and adjust management as needed.  Orders: -     Mirtazapine; Take 0.5-1 tablet (7.5-15 mg total) by mouth at bedtime.  Dispense: 30 tablet; Refill: 1    Return in about 4 weeks (around 04/08/2023) for follow-up for sleep medication.  Velva Harman, PA

## 2023-03-11 NOTE — Patient Instructions (Signed)
You can start by taking half tablet of mirtazapine to help with sleep.  If you find that this is not enough, you can increase to a full tablet at bedtime.

## 2023-03-11 NOTE — Assessment & Plan Note (Signed)
Restart Adderall 20 mg daily to improve focus and help with school.  Will follow-up in 1 month to ensure that dose is appropriate and there are no adverse effects.

## 2023-03-12 ENCOUNTER — Other Ambulatory Visit (HOSPITAL_BASED_OUTPATIENT_CLINIC_OR_DEPARTMENT_OTHER): Payer: Self-pay

## 2023-04-13 ENCOUNTER — Ambulatory Visit (INDEPENDENT_AMBULATORY_CARE_PROVIDER_SITE_OTHER): Payer: 59 | Admitting: Family Medicine

## 2023-04-13 ENCOUNTER — Encounter: Payer: Self-pay | Admitting: Family Medicine

## 2023-04-13 VITALS — BP 122/87 | HR 96 | Resp 18 | Ht 66.0 in | Wt 217.0 lb

## 2023-04-13 DIAGNOSIS — G4700 Insomnia, unspecified: Secondary | ICD-10-CM | POA: Diagnosis not present

## 2023-04-13 NOTE — Progress Notes (Signed)
   Established Patient Office Visit  Subjective   Patient ID: Carrie Reed, female    DOB: 03-23-1986  Age: 37 y.o. MRN: [id number]  Chief Complaint  Patient presents with   Insomnia    HPI Carrie Reed is a 37 y.o. female presenting today for follow up of insomnia.  At last appointment, initiated trial of mirtazapine 15 mg nightly.  She tried 1 night of half a tablet and slept for 15 hours straight close had next-day somnolence that prevented her from functioning normally.  She has not tried it again since that first night.  She does take her last final of the semester today and is hopeful that her anxiety/racing thoughts will improve during the summer since she is taking some time off from classes.  She plans on using melatonin over-the-counter if she still struggles to sleep.  ROS Negative unless otherwise noted in HPI   Objective:     BP 122/87 (BP Location: Left Arm, Patient Position: Sitting, Cuff Size: Normal)   Pulse 96   Resp 18   Ht 5\' 6"  (1.676 m)   Wt 217 lb (98.4 kg)   SpO2 96%   BMI 35.02 kg/m   Physical Exam Constitutional:      General: She is not in acute distress.    Appearance: Normal appearance.  HENT:     Head: Normocephalic and atraumatic.  Pulmonary:     Effort: Pulmonary effort is normal. No respiratory distress.  Musculoskeletal:     Cervical back: Normal range of motion.  Neurological:     General: No focal deficit present.     Mental Status: She is alert and oriented to person, place, and time. Mental status is at baseline.  Psychiatric:        Mood and Affect: Mood normal.        Thought Content: Thought content normal.        Judgment: Judgment normal.     Assessment & Plan:  Insomnia, unspecified type Assessment & Plan: Remeron was too sedating, patient would prefer to continue with over-the-counter melatonin as needed.  Discontinue mirtazapine.  In the future, if additional support is necessary hydroxyzine may be a good option to help  with the anxiety/racing thoughts in addition to its sedating effect.     Return in about 2 months (around 06/13/2023) for follow-up for ADHD.  This can be a video visit or in person.   Melida Quitter, PA

## 2023-04-13 NOTE — Assessment & Plan Note (Addendum)
Remeron was too sedating, patient would prefer to continue with over-the-counter melatonin as needed.  Discontinue mirtazapine.  In the future, if additional support is necessary hydroxyzine may be a good option to help with the anxiety/racing thoughts in addition to its sedating effect.

## 2023-05-06 ENCOUNTER — Other Ambulatory Visit: Payer: Self-pay | Admitting: Family Medicine

## 2023-05-06 DIAGNOSIS — F909 Attention-deficit hyperactivity disorder, unspecified type: Secondary | ICD-10-CM

## 2023-05-07 NOTE — Telephone Encounter (Signed)
Please fill active prescription that starts tomorrow.

## 2023-05-08 ENCOUNTER — Other Ambulatory Visit (HOSPITAL_BASED_OUTPATIENT_CLINIC_OR_DEPARTMENT_OTHER): Payer: Self-pay

## 2023-06-15 ENCOUNTER — Encounter: Payer: Self-pay | Admitting: Family Medicine

## 2023-06-15 ENCOUNTER — Other Ambulatory Visit (HOSPITAL_BASED_OUTPATIENT_CLINIC_OR_DEPARTMENT_OTHER): Payer: Self-pay

## 2023-06-15 ENCOUNTER — Telehealth (INDEPENDENT_AMBULATORY_CARE_PROVIDER_SITE_OTHER): Payer: 59 | Admitting: Family Medicine

## 2023-06-15 VITALS — Ht 66.0 in | Wt 217.0 lb

## 2023-06-15 DIAGNOSIS — F909 Attention-deficit hyperactivity disorder, unspecified type: Secondary | ICD-10-CM

## 2023-06-15 DIAGNOSIS — G4700 Insomnia, unspecified: Secondary | ICD-10-CM

## 2023-06-15 MED ORDER — AMPHETAMINE-DEXTROAMPHET ER 20 MG PO CP24
20.0000 mg | ORAL_CAPSULE | Freq: Every day | ORAL | 0 refills | Status: DC
Start: 2023-06-15 — End: 2023-10-21
  Filled 2023-06-15 – 2023-08-01 (×2): qty 30, 30d supply, fill #0

## 2023-06-15 MED ORDER — AMPHETAMINE-DEXTROAMPHET ER 20 MG PO CP24
20.0000 mg | ORAL_CAPSULE | Freq: Every day | ORAL | 0 refills | Status: DC
Start: 2023-06-15 — End: 2023-06-15

## 2023-06-15 MED ORDER — AMPHETAMINE-DEXTROAMPHET ER 20 MG PO CP24
20.0000 mg | ORAL_CAPSULE | Freq: Every day | ORAL | 0 refills | Status: DC
Start: 2023-07-13 — End: 2023-12-04
  Filled 2023-08-01: qty 30, 30d supply, fill #0

## 2023-06-15 MED ORDER — AMPHETAMINE-DEXTROAMPHET ER 20 MG PO CP24
20.00 mg | ORAL_CAPSULE | Freq: Every day | ORAL | 0 refills | Status: DC
Start: 2023-08-11 — End: 2023-06-15

## 2023-06-15 MED ORDER — AMPHETAMINE-DEXTROAMPHET ER 20 MG PO CP24
20.0000 mg | ORAL_CAPSULE | Freq: Every day | ORAL | 0 refills | Status: DC
Start: 2023-07-13 — End: 2023-06-15

## 2023-06-15 MED ORDER — AMPHETAMINE-DEXTROAMPHET ER 20 MG PO CP24
20.0000 mg | ORAL_CAPSULE | Freq: Every day | ORAL | 0 refills | Status: DC
Start: 2023-08-11 — End: 2023-11-15
  Filled 2023-10-15: qty 30, 30d supply, fill #0

## 2023-06-15 NOTE — Assessment & Plan Note (Signed)
Remeron was too sedating and cause patient to sleep for 15 hours at a time.  Continue with over-the-counter melatonin 5 mg as needed for sleep.  If additional support is needed after she is done with classes, we may consider something like hydroxyzine at bedtime for racing thoughts and sedating effect.

## 2023-06-15 NOTE — Progress Notes (Signed)
Virtual Visit via Video Note  I connected with Carrie Reed on 06/15/23 at 10:50 AM EDT by a video enabled telemedicine application and verified that I am speaking with the correct person using two identifiers.  Patient Location: Home Provider Location: Office/Clinic  I discussed the limitations, risks, security, and privacy concerns of performing an evaluation and management service by video and the availability of in person appointments. I also discussed with the patient that there may be a patient responsible charge related to this service. The patient expressed understanding and agreed to proceed.    Subjective   Patient ID: Carrie Reed, female    DOB: 11-13-1986  Age: 37 y.o. MRN: [id number]  Chief Complaint  Patient presents with   ADHD   Insomnia    HPI Carrie Reed is a 37 y.o. female presenting today for follow up of ADHD and sleep. ADHD: Reports symptoms are well controlled on Adderall 20 mg daily. Denies any problems with insomnia, chest pain, palpitations, or SOB.   Insomnia: She is taking OTC melatonin 5 mg nightly, denies side effects.  She typically gets 3-4 hours of sleep at night, but she is able to get about 8 hours the night after she finishes her three 12-hour shifts.  She states that this is enough for her to be able to function right now.  Previous sleeping medications have all been too sedating and caused her to sleep too much and be groggy the next day.  ROS Negative unless otherwise noted in HPI  Outpatient Medications Prior to Visit  Medication Sig   ibuprofen (ADVIL) 800 MG tablet Take 1 tablet (800 mg total) by mouth 3 (three) times daily.   NURTEC 75 MG TBDP Take 1 tablet by mouth daily as needed.   ondansetron (ZOFRAN) 4 MG tablet Take 4 mg by mouth every 8 (eight) hours as needed for nausea or vomiting.   SUMAtriptan (IMITREX) 100 MG tablet One tablet at the onset of headache.  May repeat in two hours if the headache pain persists.  No more than 2  tablets in a 24 hour period.   [DISCONTINUED] amphetamine-dextroamphetamine (ADDERALL XR) 20 MG 24 hr capsule Take 1 capsule (20 mg total) by mouth daily.   [DISCONTINUED] amphetamine-dextroamphetamine (ADDERALL XR) 20 MG 24 hr capsule Take 1 capsule (20 mg total) by mouth daily.   [DISCONTINUED] amphetamine-dextroamphetamine (ADDERALL XR) 20 MG 24 hr capsule Take 1 capsule (20 mg total) by mouth daily.   [DISCONTINUED] mirtazapine (REMERON) 15 MG tablet Take 0.5-1 tablet (7.5-15 mg total) by mouth at bedtime.   No facility-administered medications prior to visit.     Objective:    Height 5\' 6"  (1.676 m), weight 217 lb (98.4 kg).   Physical Exam General: Speaking clearly in complete sentences without any shortness of breath.  Alert and oriented x3.  Normal judgment. No apparent acute distress.   Assessment & Plan:  Attention deficit hyperactivity disorder (ADHD), unspecified ADHD type Assessment & Plan: Continue Adderall 20 mg daily to for focus.  PDMP reviewed, no aberrancies.  Refills for next 3 months sent in.  Follow-up in 3 months.  Orders: -     Amphetamine-Dextroamphet ER; Take 1 capsule (20 mg total) by mouth daily.  Dispense: 30 capsule; Refill: 0 -     Amphetamine-Dextroamphet ER; Take 1 capsule (20 mg total) by mouth daily.  Dispense: 30 capsule; Refill: 0 -     Amphetamine-Dextroamphet ER; Take 1 capsule (20 mg total) by mouth daily.  Dispense: 30  capsule; Refill: 0  Insomnia, unspecified type Assessment & Plan: Remeron was too sedating and cause patient to sleep for 15 hours at a time.  Continue with over-the-counter melatonin 5 mg as needed for sleep.  If additional support is needed after she is done with classes, we may consider something like hydroxyzine at bedtime for racing thoughts and sedating effect.     Return in about 3 months (around 09/15/2023) for follow-up for ADHD and sleep, in person or video. Next visit after that will be annual physical.  I discussed  the assessment and treatment plan with the patient. The patient was provided an opportunity to ask questions, and all were answered. The patient agreed with the plan and demonstrated an understanding of the instructions.   The patient was advised to call back or seek an in-person evaluation if the symptoms worsen or if the condition fails to improve as anticipated.  The above assessment and management plan was discussed with the patient. The patient verbalized understanding of and has agreed to the management plan.   Melida Quitter, PA

## 2023-06-15 NOTE — Addendum Note (Signed)
Addended by: Saralyn Pilar on: 06/15/2023 11:36 AM   Modules accepted: Orders

## 2023-06-15 NOTE — Assessment & Plan Note (Signed)
Continue Adderall 20 mg daily to for focus.  PDMP reviewed, no aberrancies.  Refills for next 3 months sent in.  Follow-up in 3 months.

## 2023-07-17 ENCOUNTER — Other Ambulatory Visit (HOSPITAL_BASED_OUTPATIENT_CLINIC_OR_DEPARTMENT_OTHER): Payer: Self-pay

## 2023-07-17 ENCOUNTER — Telehealth: Payer: 59 | Admitting: Physician Assistant

## 2023-07-17 DIAGNOSIS — R3989 Other symptoms and signs involving the genitourinary system: Secondary | ICD-10-CM | POA: Diagnosis not present

## 2023-07-17 MED ORDER — CEPHALEXIN 500 MG PO CAPS
500.0000 mg | ORAL_CAPSULE | Freq: Two times a day (BID) | ORAL | 0 refills | Status: DC
Start: 2023-07-17 — End: 2023-08-05
  Filled 2023-07-17: qty 14, 7d supply, fill #0

## 2023-07-17 NOTE — Progress Notes (Signed)
E-Visit for Urinary Problems  We are sorry that you are not feeling well.  Here is how we plan to help!  Based on what you shared with me it looks like you most likely have a simple urinary tract infection.  A UTI (Urinary Tract Infection) is a bacterial infection of the bladder.  Most cases of urinary tract infections are simple to treat but a key part of your care is to encourage you to drink plenty of fluids and watch your symptoms carefully.  I have prescribed Keflex 500 mg twice a day for 7 days.  Your symptoms should gradually improve. Call us if the burning in your urine worsens, you develop worsening fever, back pain or pelvic pain or if your symptoms do not resolve after completing the antibiotic.  Urinary tract infections can be prevented by drinking plenty of water to keep your body hydrated.  Also be sure when you wipe, wipe from front to back and don't hold it in!  If possible, empty your bladder every 4 hours.  HOME CARE Drink plenty of fluids Compete the full course of the antibiotics even if the symptoms resolve Remember, when you need to go.go. Holding in your urine can increase the likelihood of getting a UTI! GET HELP RIGHT AWAY IF: You cannot urinate You get a high fever Worsening back pain occurs You see blood in your urine You feel sick to your stomach or throw up You feel like you are going to pass out  MAKE SURE YOU  Understand these instructions. Will watch your condition. Will get help right away if you are not doing well or get worse.   Thank you for choosing an e-visit.  Your e-visit answers were reviewed by a board certified advanced clinical practitioner to complete your personal care plan. Depending upon the condition, your plan could have included both over the counter or prescription medications.  Please review your pharmacy choice. Make sure the pharmacy is open so you can pick up prescription now. If there is a problem, you may contact your  provider through MyChart messaging and have the prescription routed to another pharmacy.  Your safety is important to us. If you have drug allergies check your prescription carefully.   For the next 24 hours you can use MyChart to ask questions about today's visit, request a non-urgent call back, or ask for a work or school excuse. You will get an email in the next two days asking about your experience. I hope that your e-visit has been valuable and will speed your recovery.  I have spent 5 minutes in review of e-visit questionnaire, review and updating patient chart, medical decision making and response to patient.   Jennifer M Burnette, PA-C  

## 2023-08-01 ENCOUNTER — Other Ambulatory Visit (HOSPITAL_BASED_OUTPATIENT_CLINIC_OR_DEPARTMENT_OTHER): Payer: Self-pay

## 2023-08-02 ENCOUNTER — Emergency Department (HOSPITAL_BASED_OUTPATIENT_CLINIC_OR_DEPARTMENT_OTHER): Payer: 59

## 2023-08-02 ENCOUNTER — Emergency Department (HOSPITAL_BASED_OUTPATIENT_CLINIC_OR_DEPARTMENT_OTHER)
Admission: EM | Admit: 2023-08-02 | Discharge: 2023-08-02 | Disposition: A | Discharge status: Home / Self Care | Payer: 59 | Attending: Emergency Medicine | Admitting: Emergency Medicine

## 2023-08-02 ENCOUNTER — Other Ambulatory Visit: Payer: Self-pay

## 2023-08-02 ENCOUNTER — Encounter (HOSPITAL_BASED_OUTPATIENT_CLINIC_OR_DEPARTMENT_OTHER): Payer: Self-pay | Admitting: Emergency Medicine

## 2023-08-02 ENCOUNTER — Emergency Department (HOSPITAL_BASED_OUTPATIENT_CLINIC_OR_DEPARTMENT_OTHER): Payer: 59 | Admitting: Radiology

## 2023-08-02 DIAGNOSIS — R0602 Shortness of breath: Secondary | ICD-10-CM | POA: Diagnosis not present

## 2023-08-02 DIAGNOSIS — U071 COVID-19: Secondary | ICD-10-CM | POA: Diagnosis not present

## 2023-08-02 DIAGNOSIS — Z1152 Encounter for screening for COVID-19: Secondary | ICD-10-CM | POA: Diagnosis not present

## 2023-08-02 DIAGNOSIS — R110 Nausea: Secondary | ICD-10-CM | POA: Diagnosis not present

## 2023-08-02 DIAGNOSIS — R079 Chest pain, unspecified: Secondary | ICD-10-CM | POA: Diagnosis not present

## 2023-08-02 DIAGNOSIS — R000 Tachycardia, unspecified: Secondary | ICD-10-CM | POA: Insufficient documentation

## 2023-08-02 DIAGNOSIS — R071 Chest pain on breathing: Secondary | ICD-10-CM | POA: Diagnosis not present

## 2023-08-02 DIAGNOSIS — R0789 Other chest pain: Secondary | ICD-10-CM | POA: Diagnosis not present

## 2023-08-02 LAB — BASIC METABOLIC PANEL
Anion gap: 17 — ABNORMAL HIGH (ref 5–15)
BUN: 10 mg/dL (ref 6–20)
CO2: 17 mmol/L — ABNORMAL LOW (ref 22–32)
Calcium: 10 mg/dL (ref 8.9–10.3)
Chloride: 102 mmol/L (ref 98–111)
Creatinine, Ser: 0.88 mg/dL (ref 0.44–1.00)
GFR, Estimated: >60 mL/min (ref >60)
Glucose, Bld: 99 mg/dL (ref 70–99)
Potassium: 3.6 mmol/L (ref 3.5–5.1)
Sodium: 136 mmol/L (ref 135–145)

## 2023-08-02 LAB — CBC
HCT: 39.3 % (ref 36.0–46.0)
Hemoglobin: 14 g/dL (ref 12.0–15.0)
MCH: 30.8 pg (ref 26.0–34.0)
MCHC: 35.6 g/dL (ref 30.0–36.0)
MCV: 86.6 fL (ref 80.0–100.0)
Platelets: 339 10*3/uL (ref 150–400)
RBC: 4.54 MIL/uL (ref 3.87–5.11)
RDW: 12.4 % (ref 11.5–15.5)
WBC: 5.9 10*3/uL (ref 4.0–10.5)
nRBC: 0 % (ref 0.0–0.2)

## 2023-08-02 LAB — TROPONIN I (HIGH SENSITIVITY)
Troponin I (High Sensitivity): 3 ng/L (ref <18)
Troponin I (High Sensitivity): 2 ng/L (ref <18)

## 2023-08-02 LAB — PREGNANCY, URINE: Preg Test, Ur: NEGATIVE

## 2023-08-02 LAB — SARS CORONAVIRUS 2 BY RT PCR: SARS Coronavirus 2 by RT PCR: NEGATIVE

## 2023-08-02 MED ORDER — IOHEXOL 350 MG/ML SOLN
75.0000 mL | Freq: Once | INTRAVENOUS | Status: AC | PRN
  Administered 2023-08-02: 75 mL via INTRAVENOUS

## 2023-08-02 MED ORDER — MORPHINE SULFATE (PF) 4 MG/ML IV SOLN
4.0000 mg | Freq: Once | INTRAVENOUS | Status: AC
  Administered 2023-08-02: 4 mg via INTRAVENOUS
  Filled 2023-08-02: qty 1

## 2023-08-02 MED ORDER — NAPROXEN 500 MG PO TABS
500.0000 mg | ORAL_TABLET | Freq: Two times a day (BID) | ORAL | 0 refills | Status: DC
  Filled 2023-08-02: qty 30, 15d supply, fill #0

## 2023-08-02 NOTE — ED Triage Notes (Signed)
Right sided chest pain, sharp pain. Sob, "hard time catching breathe",  Worse today Covid last week.

## 2023-08-02 NOTE — Discharge Instructions (Addendum)
Please take your medications as prescribed. Take tylenol/ibuprofen/naproxen for pain. I recommend close follow-up with cardiology for reevaluation.  Please do not hesitate to return to emergency department if worrisome signs symptoms we discussed become apparent.

## 2023-08-02 NOTE — ED Provider Notes (Signed)
[place] EMERGENCY DEPARTMENT AT [facility] Provider Note   CSN: [id number] Arrival date & time: 08/02/23  1348     History  Chief Complaint  Patient presents with   Chest Pain    Carrie Reed is a 37 y.o. female history of ADHD, migraines presents today for evaluation of chest pain.  Symptoms started 2 hours ago when patient was folding clothes.  Pain is on the right side of her chest, radiating to the right shoulder and right upper back.  Denies any illicit drug use.  Denies drinking alcohol.  Endorses shortness of breath and nausea, denies vomiting.  States she also has bad cramping in her hands.  Denies fever.  Tested positive for COVID last week.   Chest Pain   Past Medical History:  Diagnosis Date   Bulging of cervical intervertebral disc    Degenerative disc disease, lumbar    Migraine    Past Surgical History:  Procedure Laterality Date   APPENDECTOMY     CHOLECYSTECTOMY N/A 08/29/2019   Procedure: LAPAROSCOPIC CHOLECYSTECTOMY;  Surgeon: Berna Bue, MD;  Location: [facility] ORS;  Service: General;  Laterality: N/A;   TONSILLECTOMY     TONSILLECTOMY AND ADENOIDECTOMY     x2   TUBAL LIGATION     TYMPANOSTOMY TUBE PLACEMENT     UTERINE TEAR     d/t late stage abortion     Home Medications Prior to Admission medications   Medication Sig Start Date End Date Taking? Authorizing Provider  naproxen (NAPROSYN) 500 MG tablet Take 1 tablet (500 mg total) by mouth 2 (two) times daily. 08/02/23  Yes Jeanelle Malling, PA  amphetamine-dextroamphetamine (ADDERALL XR) 20 MG 24 hr capsule Take 1 capsule (20 mg total) by mouth daily. 07/13/23 08/31/23  Melida Quitter, PA  amphetamine-dextroamphetamine (ADDERALL XR) 20 MG 24 hr capsule Take 1 capsule (20 mg total) by mouth daily. 08/11/23 09/10/23  Melida Quitter, PA  amphetamine-dextroamphetamine (ADDERALL XR) 20 MG 24 hr capsule Take 1 capsule (20 mg total) by mouth daily. 06/15/23 07/15/23  Melida Quitter, PA  cephALEXin  (KEFLEX) 500 MG capsule Take 1 capsule (500 mg total) by mouth 2 (two) times daily. 07/17/23   Margaretann Loveless, PA-C  ibuprofen (ADVIL) 800 MG tablet Take 1 tablet (800 mg total) by mouth 3 (three) times daily. 08/14/20   Hall-Potvin, [place], PA-C  NURTEC 75 MG TBDP Take 1 tablet by mouth daily as needed. 06/18/21   [provider]  ondansetron (ZOFRAN) 4 MG tablet Take 4 mg by mouth every 8 (eight) hours as needed for nausea or vomiting.    [provider]  SUMAtriptan (IMITREX) 100 MG tablet One tablet at the onset of headache.  May repeat in two hours if the headache pain persists.  No more than 2 tablets in a 24 hour period. 06/18/21   [provider]  promethazine (PHENERGAN) 25 MG tablet Take 1 tablet (25 mg total) by mouth every 6 (six) hours as needed for nausea or vomiting. 08/09/19 08/14/20  McDonald, Mia A, PA-C      Allergies    Propranolol and Phenylpropanolamine    Review of Systems   Review of Systems  Cardiovascular:  Positive for chest pain.    Physical Exam Updated Vital Signs BP 109/82   Pulse 91   Temp 97.7 F (36.5 C) (Oral)   Resp 14   LMP 07/16/2023 (Approximate)   SpO2 98%  Physical Exam Vitals and nursing note reviewed.  Constitutional:  Appearance: Normal appearance.  HENT:     Head: Normocephalic and atraumatic.     Mouth/Throat:     Mouth: Mucous membranes are moist.  Eyes:     General: No scleral icterus. Cardiovascular:     Rate and Rhythm: Regular rhythm. Tachycardia present.     Pulses: Normal pulses.     Heart sounds: Normal heart sounds.  Pulmonary:     Effort: Pulmonary effort is normal.     Breath sounds: Normal breath sounds.  Abdominal:     General: Abdomen is flat.     Palpations: Abdomen is soft.     Tenderness: There is no abdominal tenderness.  Musculoskeletal:        General: No deformity.  Skin:    General: Skin is warm.     Findings: No rash.  Neurological:     General: No focal deficit  present.     Mental Status: She is alert.  Psychiatric:        Mood and Affect: Mood is anxious.     ED Results / Procedures / Treatments   Labs (all labs ordered are listed, but only abnormal results are displayed) Labs Reviewed  BASIC METABOLIC PANEL - Abnormal; Notable for the following components:      Result Value   CO2 17 (*)    Anion gap 17 (*)    All other components within normal limits  SARS CORONAVIRUS 2 BY RT PCR  PREGNANCY, URINE  CBC  TROPONIN I (HIGH SENSITIVITY)  TROPONIN I (HIGH SENSITIVITY)    EKG EKG Interpretation Date/Time:  Sunday August 02 2023 14:07:39 EDT Ventricular Rate:  132 PR Interval:  132 QRS Duration:  91 QT Interval:  278 QTC Calculation: 412 R Axis:   80  Text Interpretation: Sinus tachycardia Repol abnrm suggests ischemia, diffuse leads Confirmed by Ernie Avena ([id number]) on 08/02/2023 2:16:50 PM  Radiology CT Angio Chest PE W and/or Wo Contrast  Result Date: 08/02/2023 CLINICAL DATA:  Recent diagnosis of COVID. Shortness of breath with right-sided sharp chest pain. EXAM: CT ANGIOGRAPHY CHEST WITH CONTRAST TECHNIQUE: Multidetector CT imaging of the chest was performed using the standard protocol during bolus administration of intravenous contrast. Multiplanar CT image reconstructions and MIPs were obtained to evaluate the vascular anatomy. RADIATION DOSE REDUCTION: This exam was performed according to the departmental dose-optimization program which includes automated exposure control, adjustment of the mA and/or kV according to patient size and/or use of iterative reconstruction technique. CONTRAST:  75mL OMNIPAQUE IOHEXOL 350 MG/ML SOLN COMPARISON:  Chest x-ray 08/02/2023 and older FINDINGS: Cardiovascular: No segmental or larger pulmonary embolism. However there is some breathing motion which significantly limits evaluation of small and peripheral emboli. Heart is nonenlarged. No pericardial effusion. The thoracic aorta has a normal course and  caliber Mediastinum/Nodes: No enlarged mediastinal, hilar, or axillary lymph nodes. Thyroid gland, trachea, and esophagus demonstrate no significant findings. Lungs/Pleura: Calcified left upper lobe lung nodule consistent with old granulomatous disease no consolidation, pneumothorax or effusion. No edema. Breathing motion seen throughout the examination. Upper Abdomen: Previous cholecystectomy Musculoskeletal: No chest wall abnormality. No acute or significant osseous findings. Review of the MIP images confirms the above findings. IMPRESSION: Breathing motion.  No segmental or larger pulmonary embolism. Electronically Signed   By: Karen Kays M.D.   On: 08/02/2023 16:35   DG Chest Port 1 View  Result Date: 08/02/2023 CLINICAL DATA:  Chest pain shortness of breath EXAM: PORTABLE CHEST 1 VIEW COMPARISON:  X-ray 07/05/2023 FINDINGS: The heart size  and mediastinal contours are within normal limits. The left inferior costophrenic angle is clipped off the edge of the film no consolidation, pneumothorax or effusion. No edema. Overlapping cardiac leads. The visualized skeletal structures are unremarkable. IMPRESSION: No acute cardiopulmonary disease. Electronically Signed   By: Karen Kays M.D.   On: 08/02/2023 15:35    Procedures Procedures    Medications Ordered in ED Medications  iohexol (OMNIPAQUE) 350 MG/ML injection 75 mL (75 mLs Intravenous Contrast Given 08/02/23 1531)  morphine (PF) 4 MG/ML injection 4 mg (4 mg Intravenous Given 08/02/23 1646)    ED Course/ Medical Decision Making/ A&P                                 Medical Decision Making Amount and/or Complexity of Data Reviewed Labs: ordered. Radiology: ordered.  Risk Prescription drug management.   This patient presents to the ED for this pain, this involves an extensive number of treatment options, and is a complaint that carries with a high risk of complications and morbidity.  The differential diagnosis includes ACS,  pericarditis, PE, pneumothorax, pneumonia, less likely dissection with essentially normal blood pressure, symmetric bilateral pulses, and no back pain.  This is not an exhaustive list.  Lab tests: I ordered and personally interpreted labs.  The pertinent results include: WBC unremarkable. Hbg unremarkable. Platelets unremarkable. Electrolytes unremarkable. BUN, creatinine unremarkable.  Negative pregnancy test.  Delta troponin was negative.  COVID was negative.  Imaging studies: I ordered imaging studies, personally reviewed, interpreted imaging and agree with the radiologist's interpretations. The results include: CT angio chest showed no PE.  Chest x-ray is negative.  Problem list/ ED course/ Critical interventions/ Medical management: HPI: See above Vital signs significant for tachycardia.  Otherwise within normal range and stable throughout visit. Laboratory/imaging studies significant for: See above. On physical examination, patient is afebrile and appears in no acute distress. Exam without evidence of volume overload so doubt heart failure. EKG without signs of active ischemia. Given the timing of pain to ER presentation,  delta troponin was negative so doubt NSTEMI.  CT angio chest showed no evidence of PE.  Chest x-ray is negative for pneumonia, pneumothorax.  Given morphine for pain.  Reevaluation of patient after this medication showed that symptom improved.  Patient's heart rate went down to the 90s.  HEART score:2 so plan to discharge patient home with cardiology follow-up. I have reviewed the patient home medicines and have made adjustments as needed.  Cardiac monitoring/EKG: The patient was maintained on a cardiac monitor.  I personally reviewed and interpreted the cardiac monitor which showed an underlying rhythm of: sinus rhythm.  Additional history obtained: External records from outside source obtained and reviewed including: Chart review including previous notes, labs,  imaging.  Consultations obtained:  Disposition Continued outpatient therapy. Follow-up with cardiology recommended for reevaluation of symptoms. Treatment plan discussed with patient.  Pt acknowledged understanding was agreeable to the plan. Worrisome signs and symptoms were discussed with patient, and patient acknowledged understanding to return to the ED if they noticed these signs and symptoms. Patient was stable upon discharge.   This chart was dictated using voice recognition software.  Despite best efforts to proofread,  errors can occur which can change the documentation meaning.          Final Clinical Impression(s) / ED Diagnoses Final diagnoses:  Chest pain, unspecified type    Rx / DC Orders ED Discharge Orders  Ordered    Ambulatory referral to Cardiology       Comments: If you have not heard from the Cardiology office within the next 72 hours please call [phone].   08/02/23 1721    naproxen (NAPROSYN) 500 MG tablet  2 times daily        08/02/23 1721              Jeanelle Malling, [place] 08/02/23 1753    Ernie Avena, MD 08/03/23 [phone]

## 2023-08-03 ENCOUNTER — Other Ambulatory Visit: Payer: Self-pay

## 2023-08-03 ENCOUNTER — Other Ambulatory Visit (HOSPITAL_BASED_OUTPATIENT_CLINIC_OR_DEPARTMENT_OTHER): Payer: Self-pay

## 2023-08-05 ENCOUNTER — Encounter: Payer: Self-pay | Admitting: Family Medicine

## 2023-08-05 ENCOUNTER — Ambulatory Visit (INDEPENDENT_AMBULATORY_CARE_PROVIDER_SITE_OTHER): Payer: 59 | Admitting: Family Medicine

## 2023-08-05 VITALS — BP 115/80 | HR 72 | Ht 66.0 in | Wt 211.8 lb

## 2023-08-05 DIAGNOSIS — D1801 Hemangioma of skin and subcutaneous tissue: Secondary | ICD-10-CM | POA: Diagnosis not present

## 2023-08-05 DIAGNOSIS — R58 Hemorrhage, not elsewhere classified: Secondary | ICD-10-CM

## 2023-08-05 NOTE — Progress Notes (Signed)
   Acute Office Visit  Subjective:     Patient ID: Carrie Reed, female    DOB: 10-06-1986, 37 y.o.   MRN: [id number]  Chief Complaint  Patient presents with   Neck Pain    HPI  Patient states that for the past 2 weeks a "bump" that she has had on her right clavicle has been bleeding.  It is in a position where she bumps into it with her lanyard or clothing frequently and this causes it to start bleeding again.  She tries to wear bandages but appears to be allergic to the tape and calling wear it for short periods of time before it irritates her skin.  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  None of the other ones have had any issues with bleeding.  Does not take any blood thinners or anticoagulant medications.  Mom has a history of a clotting hyper coagulable disorder.  ROS      Objective:    BP 115/80   Pulse 72   Ht 5\' 6"  (1.676 m)   Wt 211 lb 12.8 oz (96.1 kg)   LMP 07/16/2023 (Approximate)   SpO2 99%   BMI 34.19 kg/m    Physical Exam General: Alert, oriented Skin: Small raised, 1 to 2 mm pink lesion.  Not currently bleeding.  Located on the right clavicle.    No results found for any visits on 08/05/23.      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  Most likely it is continuing to bleed because it continues to be irritated by her clothing or other objects.  Regardless, will check PT/INR, PTT for bleeding issues.  Will refer to dermatology for treatment.  Orders: -     Ambulatory referral to Dermatology  Bleeding -     CBC -     Protime-INR -     APTT     Return if symptoms worsen or fail to improve.  Sandre Kitty, MD

## 2023-08-05 NOTE — Patient Instructions (Signed)
It was nice to see you today,  We addressed the following topics today: -I have sent in a referral to the dermatologist.  Someone should call you in 2 weeks.  If you have not heard from somebody by then let us know and we will give you their number of the person we referred you to - For your lesion you can put Vaseline on it.  If you are sensitive to the tape you can try hypoallergenic or sensitive skin tape which is available over-the-counter at the pharmacy.  You can put Vaseline over the wound followed by some gauze and then tape down the gauze with the hypoallergenic tape. - It appears to be getting infected let us know and we will take a look at it and prescribe antibiotics if needed.  Have a great day,  Frederic Jericho, MD

## 2023-08-05 NOTE — Assessment & Plan Note (Signed)
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  Most likely it is continuing to bleed because it continues to be irritated by her clothing or other objects.  Regardless, will check PT/INR, PTT for bleeding issues.  Will refer to dermatology for treatment.

## 2023-08-06 LAB — CBC
Hematocrit: 36.6 % (ref 34.0–46.6)
Hemoglobin: 12.5 g/dL (ref 11.1–15.9)
MCH: 31 pg (ref 26.6–33.0)
MCHC: 34.2 g/dL (ref 31.5–35.7)
MCV: 91 fL (ref 79–97)
Platelets: 316 10*3/uL (ref 150–450)
RBC: 4.03 x10E6/uL (ref 3.77–5.28)
RDW: 12.5 % (ref 11.7–15.4)
WBC: 5.9 10*3/uL (ref 3.4–10.8)

## 2023-08-06 LAB — PROTIME-INR
INR: 0.9 (ref 0.9–1.2)
Prothrombin Time: 10.4 s (ref 9.1–12.0)

## 2023-08-06 LAB — APTT: aPTT: 29 s (ref 24–33)

## 2023-09-10 ENCOUNTER — Other Ambulatory Visit (HOSPITAL_BASED_OUTPATIENT_CLINIC_OR_DEPARTMENT_OTHER): Payer: Self-pay

## 2023-09-10 MED ORDER — FLULAVAL 0.5 ML IM SUSY
0.5000 mL | PREFILLED_SYRINGE | Freq: Once | INTRAMUSCULAR | 0 refills | Status: AC
  Filled 2023-09-10: qty 0.5, 1d supply, fill #0

## 2023-10-07 ENCOUNTER — Encounter: Payer: Self-pay | Admitting: Family Medicine

## 2023-10-07 ENCOUNTER — Telehealth: Payer: 59 | Admitting: Family Medicine

## 2023-10-07 ENCOUNTER — Ambulatory Visit: Payer: 59 | Admitting: Family Medicine

## 2023-10-07 DIAGNOSIS — Z6834 Body mass index (BMI) 34.0-34.9, adult: Secondary | ICD-10-CM

## 2023-10-07 DIAGNOSIS — K760 Fatty (change of) liver, not elsewhere classified: Secondary | ICD-10-CM | POA: Diagnosis not present

## 2023-10-07 DIAGNOSIS — Z1159 Encounter for screening for other viral diseases: Secondary | ICD-10-CM

## 2023-10-07 DIAGNOSIS — F909 Attention-deficit hyperactivity disorder, unspecified type: Secondary | ICD-10-CM | POA: Diagnosis not present

## 2023-10-07 DIAGNOSIS — E66811 Obesity, class 1: Secondary | ICD-10-CM | POA: Diagnosis not present

## 2023-10-07 NOTE — Assessment & Plan Note (Signed)
Continue Adderall 20 mg daily to for focus.  PDMP reviewed, no aberrancies. Overdose risk score 270. Refills for next 3 months sent in.  Annual physical in 3 months.

## 2023-10-07 NOTE — Progress Notes (Signed)
   Virtual Visit via Video Note  I connected with Carrie Reed on 10/07/23 at 10:10 AM EDT by a video enabled telemedicine application and verified that I am speaking with the correct person using two identifiers.  Patient Location: Home Provider Location: Office/clinic  I discussed the limitations, risks, security, and privacy concerns of performing an evaluation and management service by video and the availability of in person appointments. I also discussed with the patient that there may be a patient responsible charge related to this service. The patient expressed understanding and agreed to proceed.    Subjective   Patient ID: Carrie Reed, female    DOB: [birth year]  Age: 37 y.o. MRN: [id number]  Chief Complaint  Patient presents with   ADHD    HPI Carrie Reed is a 37 y.o. female presenting today for follow up of ADHD.  ADHD: Reports symptoms are well controlled on Adderall XR 20 mg daily. Denies any problems with insomnia, chest pain, palpitations, or SOB.    ROS Negative unless otherwise noted in HPI  Outpatient Medications Prior to Visit  Medication Sig   amphetamine-dextroamphetamine (ADDERALL XR) 20 MG 24 hr capsule Take 1 capsule (20 mg total) by mouth daily.   amphetamine-dextroamphetamine (ADDERALL XR) 20 MG 24 hr capsule Take 1 capsule (20 mg total) by mouth daily.   amphetamine-dextroamphetamine (ADDERALL XR) 20 MG 24 hr capsule Take 1 capsule (20 mg total) by mouth daily.   ibuprofen (ADVIL) 800 MG tablet Take 1 tablet (800 mg total) by mouth 3 (three) times daily.   NURTEC 75 MG TBDP Take 1 tablet by mouth daily as needed.   SUMAtriptan (IMITREX) 100 MG tablet One tablet at the onset of headache.  May repeat in two hours if the headache pain persists.  No more than 2 tablets in a 24 hour period. (Patient not taking: Reported on 08/05/2023)   No facility-administered medications prior to visit.     Objective:     Physical Exam General: Speaking clearly in  complete sentences without any shortness of breath.  Alert and oriented x3.  Normal judgment. No apparent acute distress.   Assessment & Plan:  Attention deficit hyperactivity disorder (ADHD), unspecified ADHD type Assessment & Plan: Continue Adderall 20 mg daily to for focus.  PDMP reviewed, no aberrancies. Overdose risk score 270. Refills for next 3 months sent in.  Annual physical in 3 months.   Received tetanus booster around 2020 through [facility] for [facility]  Return in about 3 months (around 01/07/2024) for annual physical + pap, fasting blood work 1 week before.   I discussed the assessment and treatment plan with the patient. The patient was provided an opportunity to ask questions, and all were answered. The patient agreed with the plan and demonstrated an understanding of the instructions.   The patient was advised to call back or seek an in-person evaluation if the symptoms worsen or if the condition fails to improve as anticipated.  The above assessment and management plan was discussed with the patient. The patient verbalized understanding of and has agreed to the management plan.   Melida Quitter, PA

## 2023-10-15 ENCOUNTER — Other Ambulatory Visit (HOSPITAL_BASED_OUTPATIENT_CLINIC_OR_DEPARTMENT_OTHER): Payer: Self-pay

## 2023-10-21 ENCOUNTER — Other Ambulatory Visit: Payer: Self-pay | Admitting: Internal Medicine

## 2023-10-21 ENCOUNTER — Encounter: Payer: Self-pay | Admitting: Internal Medicine

## 2023-10-21 ENCOUNTER — Ambulatory Visit (INDEPENDENT_AMBULATORY_CARE_PROVIDER_SITE_OTHER): Payer: 59

## 2023-10-21 ENCOUNTER — Ambulatory Visit: Payer: 59 | Attending: Internal Medicine | Admitting: Internal Medicine

## 2023-10-21 VITALS — BP 108/80 | HR 77 | Ht 66.0 in | Wt 209.0 lb

## 2023-10-21 DIAGNOSIS — R202 Paresthesia of skin: Secondary | ICD-10-CM

## 2023-10-21 DIAGNOSIS — M79602 Pain in left arm: Secondary | ICD-10-CM | POA: Diagnosis not present

## 2023-10-21 DIAGNOSIS — R079 Chest pain, unspecified: Secondary | ICD-10-CM

## 2023-10-21 DIAGNOSIS — R002 Palpitations: Secondary | ICD-10-CM

## 2023-10-21 DIAGNOSIS — M79601 Pain in right arm: Secondary | ICD-10-CM

## 2023-10-21 NOTE — Progress Notes (Signed)
Cardiology Office Note:  .    Date:  10/21/2023  ID:  Carrie Reed, DOB 07-May-1986, MRN [id number] PCP: Melida Quitter, PA  [facility] Providers Cardiologist:  None     CC: palpitations Consulted for the evaluation of chest pain at the behest of Carrie Reed   History of Present Illness: .    Carrie Reed is a 37 y.o. female with a history of COVID-19, presented for evaluation and follow-up of chest discomfort and sinus tachycardia. The chest discomfort was not associated with any clear exacerbating factors. The patient had a normal cardiac enzyme profile and complete blood count. 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  The patient described an episode where she was folding laundry and suddenly experienced chest discomfort, dizziness, and a sensation of her heart racing. The discomfort was located towards the back, near the shoulder blade, rather than the front of the chest. The patient also reported feeling nauseous. The discomfort would ease slightly and then intensify again. During this episode, the patient's heart rate was around 130-140 beats per minute.  In addition to the chest discomfort and tachycardia, the patient experienced a unique episode of whole-body numbness, loss of facial control, and hand cramping. This episode was described as terrifying and has not recurred since. The patient continues to experience intermittent chest discomfort and feelings of faintness, followed by fatigue.  The patient has a history of migraines associated with a previous high-stress job. 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 The patient was previously seen at [facility] for these migraines.   Relevant histories: .  Social- works at [employer] ED ROS: As per HPI.  Physical Exam:    VS:  BP 108/80   Pulse 77   Ht 5\' 6"  (1.676 m)   Wt  209 lb (94.8 kg)   BMI 33.73 kg/m    Wt Readings from Last 3 Encounters:  10/21/23 209 lb (94.8 kg)  08/05/23 211 lb 12.8 oz (96.1 kg)  06/15/23 217 lb (98.4 kg)    Gen: no distress Neck: No JVD,  Cardiac: No Rubs or Gallops, no Murmur, RRR +2 radial pulses Respiratory: Clear to auscultation bilaterally, normal effort, normal  respiratory rate GI: Soft, nontender, non-distended MS: No  edema;  moves all extremities Integument: Skin feels warm  Neuro:  At time of evaluation, alert and oriented to person/place/time/situation  Psych: Normal affect, patient feels warm   ASSESSMENT AND PLAN: .    Chest Pain with Palpitations - Intermittent chest pain and palpitations, with episodes of dizziness and numbness. 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 Differential includes SVT, inappropriate sinus tachycardia, and non-cardiac causes. Normal EKG during symptoms makes SVT less likely but still worth investigating. Discussed potential neurology referral if cardiac causes are ruled out. - Order two-week heart monitor to assess for arrhythmias - If negative and symptoms are persistent, order echocardiogram with bubble study to evaluate for pulmonary hypertension - Check thyroid function with blood work - If heart monitor shows SVT, start low-dose diltiazem - If cardiac evaluation is negative, refer to [facility] for further assessment due to paraesthesias.  Follow-up - Follow up with cardiologist as needed unless + testing - Refer to [facility] if cardiac evaluation is negative.   Riley Lam, MD FASE [facility] [place]  [facility]  [address], [address] [place], [place] [address] [phone]  11:29 AM

## 2023-10-21 NOTE — Patient Instructions (Signed)
Medication Instructions:  Your physician recommends that you continue on your current medications as directed. Please refer to the Current Medication list given to you today.  *If you need a refill on your cardiac medications before your next appointment, please call your pharmacy*   Lab Work: NONE  If you have labs (blood work) drawn today and your tests are completely normal, you will receive your results only by: MyChart Message (if you have MyChart) OR A paper copy in the mail If you have any lab test that is abnormal or we need to change your treatment, we will call you to review the results.   Testing/Procedures: Your physician has requested that you wear a heart monitor.   Follow-Up:As needed [facility], you and your health needs are our priority.  As part of our continuing mission to provide you with exceptional heart care, we have created designated Provider Care Teams.  These Care Teams include your primary Cardiologist (physician) and Advanced Practice Providers (APPs -  Physician Assistants and Nurse Practitioners) who all work together to provide you with the care you need, when you need it.    Provider:   Riley Lam, MD   Other Instructions Christena Deem- Long Term Monitor Instructions  Your physician has requested you wear a ZIO patch monitor for 14 days.  This is a single patch monitor. [employer] supplies one patch monitor per enrollment. Additional stickers are not available. Please do not apply patch if you will be having a Nuclear Stress Test,   Cardiac CT, MRI, or Chest Xray during the period you would be wearing the  monitor. The patch cannot be worn during these tests. You cannot remove and re-apply the  ZIO XT patch monitor.  Your ZIO patch monitor will be mailed 3 day USPS to your address on file. It may take 3-5 days  to receive your monitor after you have been enrolled.  Once you have received your monitor, please review the enclosed  instructions. Your monitor  has already been registered assigning a specific monitor serial # to you.  Billing and Patient Assistance Program Information  We have supplied [employer] with any of your insurance information on file for billing purposes. [employer] offers a sliding scale Patient Assistance Program for patients that do not have  insurance, or whose insurance does not completely cover the cost of the ZIO monitor.  You must apply for the Patient Assistance Program to qualify for this discounted rate.  To apply, please call [employer] at [phone], select option 4, select option 2, ask to apply for  Patient Assistance Program. Meredeth Ide will ask your household income, and how many people  are in your household. They will quote your out-of-pocket cost based on that information.  [employer] will also be able to set up a 79-month, interest-free payment plan if needed.  Applying the monitor   Shave hair from upper left chest.  Hold abrader disc by orange tab. Rub abrader in 40 strokes over the upper left chest as  indicated in your monitor instructions.  Clean area with 4 enclosed alcohol pads. Let dry.  Apply patch as indicated in monitor instructions. Patch will be placed under collarbone on left  side of chest with arrow pointing upward.  Rub patch adhesive wings for 2 minutes. Remove white label marked "1". Remove the white  label marked "2". Rub patch adhesive wings for 2 additional minutes.  While looking in a mirror, press and release button in center of patch. A  small green light will  flash 3-4 times. This will be your only indicator that the monitor has been turned on.  Do not shower for the first 24 hours. You may shower after the first 24 hours.  Press the button if you feel a symptom. You will hear a small click. Record Date, Time and  Symptom in the Patient Logbook.  When you are ready to remove the patch, follow instructions on the last 2 pages of Patient  Logbook. Stick patch  monitor onto the last page of Patient Logbook.  Place Patient Logbook in the blue and white box. Use locking tab on box and tape box closed  securely. The blue and white box has prepaid postage on it. Please place it in the mailbox as  soon as possible. Your physician should have your test results approximately 7 days after the  monitor has been mailed back to [facility].  Call [facility] Customer Care at [phone] if you have questions regarding  your ZIO XT patch monitor. Call them immediately if you see an orange light blinking on your  monitor.  If your monitor falls off in less than 4 days, contact our Monitor department at [phone].  If your monitor becomes loose or falls off after 4 days call [employer] at [phone] for  suggestions on securing your monitor

## 2023-10-21 NOTE — Progress Notes (Unsigned)
Enrolled for [employer] to mail a ZIO XT long term holter monitor to the patients address on file.  

## 2023-10-26 DIAGNOSIS — R002 Palpitations: Secondary | ICD-10-CM

## 2023-10-26 DIAGNOSIS — R079 Chest pain, unspecified: Secondary | ICD-10-CM | POA: Diagnosis not present

## 2023-11-07 DIAGNOSIS — R002 Palpitations: Secondary | ICD-10-CM | POA: Diagnosis not present

## 2023-11-07 DIAGNOSIS — R079 Chest pain, unspecified: Secondary | ICD-10-CM | POA: Diagnosis not present

## 2023-12-04 ENCOUNTER — Other Ambulatory Visit (HOSPITAL_BASED_OUTPATIENT_CLINIC_OR_DEPARTMENT_OTHER): Payer: Self-pay

## 2023-12-04 ENCOUNTER — Other Ambulatory Visit: Payer: Self-pay | Admitting: Family Medicine

## 2023-12-04 DIAGNOSIS — F909 Attention-deficit hyperactivity disorder, unspecified type: Secondary | ICD-10-CM

## 2023-12-04 MED ORDER — AMPHETAMINE-DEXTROAMPHET ER 20 MG PO CP24
20.0000 mg | ORAL_CAPSULE | Freq: Every day | ORAL | 0 refills | Status: DC
  Filled 2023-12-04: qty 30, 30d supply, fill #0

## 2024-01-14 ENCOUNTER — Encounter: Payer: Self-pay | Admitting: Family Medicine

## 2024-01-29 ENCOUNTER — Other Ambulatory Visit: Payer: Self-pay | Admitting: Family Medicine

## 2024-01-29 DIAGNOSIS — F909 Attention-deficit hyperactivity disorder, unspecified type: Secondary | ICD-10-CM

## 2024-02-01 NOTE — Telephone Encounter (Signed)
 Declined to refill Adderall refill, patient overdue for appt. Was supposed to have annual physical around 01/07/24 with fasting labs 1 week before

## 2024-02-04 ENCOUNTER — Other Ambulatory Visit (HOSPITAL_BASED_OUTPATIENT_CLINIC_OR_DEPARTMENT_OTHER): Payer: Self-pay

## 2024-02-04 ENCOUNTER — Encounter (HOSPITAL_BASED_OUTPATIENT_CLINIC_OR_DEPARTMENT_OTHER): Payer: Self-pay

## 2024-03-01 ENCOUNTER — Ambulatory Visit: Admitting: Family Medicine

## 2024-03-01 ENCOUNTER — Encounter: Payer: Self-pay | Admitting: Family Medicine

## 2024-03-01 ENCOUNTER — Other Ambulatory Visit (HOSPITAL_BASED_OUTPATIENT_CLINIC_OR_DEPARTMENT_OTHER): Payer: Self-pay

## 2024-03-01 VITALS — BP 113/72 | HR 79 | Ht 66.0 in | Wt 213.1 lb

## 2024-03-01 DIAGNOSIS — F909 Attention-deficit hyperactivity disorder, unspecified type: Secondary | ICD-10-CM | POA: Diagnosis not present

## 2024-03-01 DIAGNOSIS — H6690 Otitis media, unspecified, unspecified ear: Secondary | ICD-10-CM | POA: Diagnosis not present

## 2024-03-01 MED ORDER — AMPHETAMINE-DEXTROAMPHET ER 20 MG PO CP24: 20.0000 mg | ORAL_CAPSULE | Freq: Every day | ORAL | 0 refills | Status: DC

## 2024-03-01 MED ORDER — AMOXICILLIN 875 MG PO TABS
875.0000 mg | ORAL_TABLET | Freq: Two times a day (BID) | ORAL | 0 refills | Status: AC
  Filled 2024-03-01: qty 14, 7d supply, fill #0

## 2024-03-01 MED ORDER — AMPHETAMINE-DEXTROAMPHET ER 20 MG PO CP24
20.0000 mg | ORAL_CAPSULE | Freq: Every day | ORAL | 0 refills | Status: DC
  Filled 2024-03-01: qty 30, 30d supply, fill #0

## 2024-03-01 MED ORDER — AMPHETAMINE-DEXTROAMPHET ER 20 MG PO CP24
20.0000 mg | ORAL_CAPSULE | Freq: Every day | ORAL | 0 refills | Status: DC
  Filled 2024-04-11: qty 30, 30d supply, fill #0

## 2024-03-01 NOTE — Assessment & Plan Note (Signed)
Prescribing amoxicillin x7 days

## 2024-03-01 NOTE — Progress Notes (Signed)
   Established Patient Office Visit  Subjective   Patient ID: Carrie Reed, female    DOB: 07-23-1986  Age: 38 y.o. MRN: [id number]  Chief Complaint  Patient presents with   Medical Management of Chronic Issues    HPI  Adhd -patient takes Adderall 20 mg.  Does not take it every day.  No issues with this medication does not want to make any changes.  Ear infection-patient woke up with right ear pain today.  She states she has an extensive history as a child and adult with ear infections.  Has a tube in place on the left side.  States she usually takes amoxicillin.     The ASCVD Risk score (Arnett DK, et al., 2019) failed to calculate for the following reasons:   The 2019 ASCVD risk score is only valid for ages 84 to 41  Health Maintenance Due  Topic Date Due   Hepatitis C Screening  Never done   DTaP/Tdap/Td (1 - Tdap) Never done   Cervical Cancer Screening (HPV/Pap Cotest)  09/20/2017   COVID-19 Vaccine (1 - 2024-25 season) Never done      Objective:     BP 113/72   Pulse 79   Ht 5\' 6"  (1.676 m)   Wt 213 lb 1.9 oz (96.7 kg)   SpO2 98%   BMI 34.40 kg/m    Physical Exam Neuro: Alert, oriented HEENT: Tube in place and left tympanic membrane.  Right ear canal erythematous and tender.  Right TM bulging.   No results found for any visits on 03/01/24.      Assessment & Plan:   Acute otitis media, unspecified otitis media type Assessment & Plan: Prescribing amoxicillin x 7 days.   Attention deficit hyperactivity disorder (ADHD), unspecified ADHD type Assessment & Plan: Continue Adderall 20 mg.  Follow-up in 3 months.  Orders: -     Amphetamine-Dextroamphet ER; Take 1 capsule (20 mg total) by mouth daily.  Dispense: 30 capsule; Refill: 0 -     Amphetamine-Dextroamphet ER; Take 1 capsule (20 mg total) by mouth daily.  Dispense: 30 capsule; Refill: 0 -     Amphetamine-Dextroamphet ER; Take 1 capsule (20 mg total) by mouth daily.  Dispense: 30 capsule; Refill:  0  Other orders -     Amoxicillin; Take 1 tablet (875 mg total) by mouth 2 (two) times daily for 7 days.  Dispense: 14 tablet; Refill: 0     Return in about 3 months (around 06/01/2024) for adhd.    Sandre Kitty, MD

## 2024-03-01 NOTE — Assessment & Plan Note (Signed)
 Continue Adderall 20 mg.  Follow-up in 3 months.

## 2024-03-01 NOTE — Patient Instructions (Addendum)
 It was nice to see you today,  We addressed the following topics today: -I have sent in your antibiotics and your Adderall.  Have a great day,  Frederic Jericho, MD

## 2024-03-04 ENCOUNTER — Ambulatory Visit: Admitting: Family Medicine

## 2024-04-11 ENCOUNTER — Other Ambulatory Visit: Payer: Self-pay

## 2024-04-11 ENCOUNTER — Other Ambulatory Visit (HOSPITAL_BASED_OUTPATIENT_CLINIC_OR_DEPARTMENT_OTHER): Payer: Self-pay

## 2024-06-01 ENCOUNTER — Ambulatory Visit: Admitting: Family Medicine

## 2024-06-02 ENCOUNTER — Other Ambulatory Visit (HOSPITAL_BASED_OUTPATIENT_CLINIC_OR_DEPARTMENT_OTHER): Payer: Self-pay

## 2024-06-02 ENCOUNTER — Ambulatory Visit

## 2024-06-02 DIAGNOSIS — F909 Attention-deficit hyperactivity disorder, unspecified type: Secondary | ICD-10-CM | POA: Diagnosis not present

## 2024-06-02 MED ORDER — AMPHETAMINE-DEXTROAMPHET ER 20 MG PO CP24
20.0000 mg | ORAL_CAPSULE | Freq: Every day | ORAL | 0 refills | Status: DC
  Filled 2024-07-13: qty 30, 30d supply, fill #0

## 2024-06-02 MED ORDER — AMPHETAMINE-DEXTROAMPHET ER 20 MG PO CP24
20.0000 mg | ORAL_CAPSULE | Freq: Every day | ORAL | 0 refills | Status: DC
  Filled 2024-06-02: qty 30, 30d supply, fill #0

## 2024-06-02 MED ORDER — AMPHETAMINE-DEXTROAMPHET ER 20 MG PO CP24
20.0000 mg | ORAL_CAPSULE | Freq: Every day | ORAL | 0 refills | Status: DC
  Filled 2024-09-01: qty 30, 30d supply, fill #0

## 2024-06-02 NOTE — Assessment & Plan Note (Signed)
 Continue Adderall XR 20 mg for ADHD. PDMP reviewed, no aberrancies. Follow up for ADHD and physical in October.

## 2024-06-02 NOTE — Progress Notes (Signed)
   Established Patient Office Visit  Subjective   Patient ID: Carrie Reed, female    DOB: Apr 19, 1986  Age: 38 y.o. MRN: [id number]  Chief Complaint  Patient presents with   Medication Management   HPI  Carrie Reed is a 38 y.o. y/o female who presents to the clinic today for follow up on ADHD.   Pt is currently taking Adderall XR 20 mg daily for ADHD. Reports that she is tolerating this medication well and denies side effects. Happy with the current dose and feels that it helps keep her focus.     ROS Per HPI.    Objective:     BP (!) 129/91   Pulse 91   Ht 5' 6 (1.676 m)   Wt 204 lb 0.6 oz (92.6 kg)   LMP 05/19/2024 (Approximate)   SpO2 97%   BMI 32.93 kg/m    Physical Exam Constitutional:      General: She is not in acute distress.    Appearance: Normal appearance.   Cardiovascular:     Rate and Rhythm: Normal rate and regular rhythm.     Heart sounds: Normal heart sounds. No murmur heard.    No friction rub. No gallop.  Pulmonary:     Effort: Pulmonary effort is normal. No respiratory distress.     Breath sounds: Normal breath sounds.   Musculoskeletal:        General: No swelling.   Skin:    General: Skin is warm and dry.   Neurological:     General: No focal deficit present.     Mental Status: She is alert.   Psychiatric:        Mood and Affect: Mood normal.        Behavior: Behavior normal.        Thought Content: Thought content normal.      No results found for any visits on 06/02/24.    The ASCVD Risk score (Arnett DK, et al., 2019) failed to calculate for the following reasons:   The 2019 ASCVD risk score is only valid for ages 63 to 34    Assessment & Plan:   Attention deficit hyperactivity disorder (ADHD), unspecified ADHD type Assessment & Plan: Continue Adderall XR 20 mg for ADHD. PDMP reviewed, no aberrancies. Follow up for ADHD and physical in October.  Orders: -     Amphetamine -Dextroamphet ER; Take 1 capsule (20 mg  total) by mouth daily.  Dispense: 30 capsule; Refill: 0 -     Amphetamine -Dextroamphet ER; Take 1 capsule (20 mg total) by mouth daily.  Dispense: 30 capsule; Refill: 0 -     Amphetamine -Dextroamphet ER; Take 1 capsule (20 mg total) by mouth daily.  Dispense: 30 capsule; Refill: 0    Return in about 4 months (around 10/02/2024) for Physical, pap.    Saddie JULIANNA Sacks, PA-C

## 2024-06-02 NOTE — Patient Instructions (Signed)
 It was nice to see you today!  As we discussed in clinic:  -I have sent in the 3 prescriptions for your Adderall XR 20 mg daily for ADHD. -Let's plan to do your physical and 3 month follow up for ADHD in October with labs the week before!  -It was nice to meet you! Good luck with school!!  If you have any problems before your next visit feel free to message me via MyChart (minor issues or questions) or call the office, otherwise you may reach out to schedule an office visit.  Thank you! Saddie Sacks, PA-C

## 2024-06-06 ENCOUNTER — Encounter: Payer: Self-pay | Admitting: Physician Assistant

## 2024-06-06 ENCOUNTER — Ambulatory Visit (INDEPENDENT_AMBULATORY_CARE_PROVIDER_SITE_OTHER): Admitting: Physician Assistant

## 2024-06-06 VITALS — BP 131/88

## 2024-06-06 DIAGNOSIS — D229 Melanocytic nevi, unspecified: Secondary | ICD-10-CM | POA: Diagnosis not present

## 2024-06-06 DIAGNOSIS — L821 Other seborrheic keratosis: Secondary | ICD-10-CM

## 2024-06-06 DIAGNOSIS — D1801 Hemangioma of skin and subcutaneous tissue: Secondary | ICD-10-CM

## 2024-06-06 DIAGNOSIS — D225 Melanocytic nevi of trunk: Secondary | ICD-10-CM | POA: Diagnosis not present

## 2024-06-06 DIAGNOSIS — W908XXA Exposure to other nonionizing radiation, initial encounter: Secondary | ICD-10-CM

## 2024-06-06 DIAGNOSIS — L578 Other skin changes due to chronic exposure to nonionizing radiation: Secondary | ICD-10-CM | POA: Diagnosis not present

## 2024-06-06 DIAGNOSIS — L980 Pyogenic granuloma: Secondary | ICD-10-CM | POA: Diagnosis not present

## 2024-06-06 DIAGNOSIS — D239 Other benign neoplasm of skin, unspecified: Secondary | ICD-10-CM

## 2024-06-06 DIAGNOSIS — Z1283 Encounter for screening for malignant neoplasm of skin: Secondary | ICD-10-CM

## 2024-06-06 DIAGNOSIS — L814 Other melanin hyperpigmentation: Secondary | ICD-10-CM | POA: Diagnosis not present

## 2024-06-06 DIAGNOSIS — D485 Neoplasm of uncertain behavior of skin: Secondary | ICD-10-CM

## 2024-06-06 DIAGNOSIS — D492 Neoplasm of unspecified behavior of bone, soft tissue, and skin: Secondary | ICD-10-CM

## 2024-06-06 HISTORY — DX: Other benign neoplasm of skin, unspecified: D23.9

## 2024-06-06 NOTE — Patient Instructions (Signed)

## 2024-06-06 NOTE — Progress Notes (Signed)
 Follow-Up Visit   Subjective  Carrie Reed is a 38 y.o. female who presents for the following: Skin Cancer Screening and Upper Body Skin Exam - She has a red spot on her right neck that popped up about a year ago and it bleeds sometimes. She also has a few other areas of concern.   The patient presents for Upper Body Skin Exam (UBSE) for skin cancer screening and mole check. The patient has spots, moles and lesions to be evaluated, some may be new or changing and the patient may have concern these could be cancer.    The following portions of the chart were reviewed this encounter and updated as appropriate: medications, allergies, medical history  Review of Systems:  No other skin or systemic complaints except as noted in HPI or Assessment and Plan.  Objective  Well appearing patient in no apparent distress; mood and affect are within normal limits.  All skin waist up examined. Relevant physical exam findings are noted in the Assessment and Plan.  Right clavicle 0.3 cm flesh papule  Right breast 0.3 cm dark brown macule  Assessment & Plan   NEOPLASM OF UNCERTAIN BEHAVIOR OF SKIN (2) Right clavicle Epidermal / dermal shaving  Informed consent: discussed and consent obtained   Timeout: patient name, date of birth, surgical site, and procedure verified   Procedure prep:  Patient was prepped and draped in usual sterile fashion Prep type:  Isopropyl alcohol Anesthesia: the lesion was anesthetized in a standard fashion   Anesthetic:  1% lidocaine  w/ epinephrine 1-100,000 buffered w/ 8.4% NaHCO3 Instrument used: flexible razor blade   Hemostasis achieved with: pressure, aluminum chloride and electrodesiccation   Outcome: patient tolerated procedure well   Post-procedure details: sterile dressing applied and wound care instructions given   Dressing type: bandage and petrolatum   Specimen 1 - Surgical pathology Differential Diagnosis: Irritated nevus vs skin tag  Check  Margins: No Right breast Epidermal / dermal shaving  Informed consent: discussed and consent obtained   Timeout: patient name, date of birth, surgical site, and procedure verified   Procedure prep:  Patient was prepped and draped in usual sterile fashion Prep type:  Isopropyl alcohol Anesthesia: the lesion was anesthetized in a standard fashion   Anesthetic:  1% lidocaine  w/ epinephrine 1-100,000 buffered w/ 8.4% NaHCO3 Instrument used: flexible razor blade   Hemostasis achieved with: pressure, aluminum chloride and electrodesiccation   Outcome: patient tolerated procedure well   Post-procedure details: sterile dressing applied and wound care instructions given   Dressing type: bandage and petrolatum   Specimen 2 - Surgical pathology Differential Diagnosis: Dysplastic nevus vs Melanoma  Check Margins: No SCREENING EXAM FOR SKIN CANCER   ACTINIC SKIN DAMAGE   LENTIGINES   SEBORRHEIC KERATOSIS   CHERRY ANGIOMA   MULTIPLE PIGMENTED NEVI   Skin cancer screening performed today.  Actinic Damage - Chronic condition, secondary to cumulative UV/sun exposure - diffuse scaly erythematous macules with underlying dyspigmentation - Recommend daily broad spectrum sunscreen SPF 30+ to sun-exposed areas, reapply every 2 hours as needed.  - Staying in the shade or wearing long sleeves, sun glasses (UVA+UVB protection) and wide brim hats (4-inch brim around the entire circumference of the hat) are also recommended for sun protection.  - Call for new or changing lesions.  Lentigines, Seborrheic Keratoses, cherry angiomas, Benign normal skin lesions - Benign-appearing - Call for any changes  Melanocytic Nevi - Tan-brown and/or pink-flesh-colored symmetric macules and papules - Benign appearing on exam today -  Observation - Call clinic for new or changing moles - Recommend daily use of broad spectrum spf 30+ sunscreen to sun-exposed areas.     Return in about 1 year (around  06/06/2025) for TBSE.  I, Roseline Hutchinson, CMA, am acting as scribe for Dakin Madani K, PA-C .   Documentation: I have reviewed the above documentation for accuracy and completeness, and I agree with the above.  Calyse Murcia K, PA-C

## 2024-06-07 ENCOUNTER — Ambulatory Visit: Payer: Self-pay | Admitting: Physician Assistant

## 2024-06-07 LAB — SURGICAL PATHOLOGY

## 2024-07-13 ENCOUNTER — Other Ambulatory Visit (HOSPITAL_BASED_OUTPATIENT_CLINIC_OR_DEPARTMENT_OTHER): Payer: Self-pay

## 2024-09-01 ENCOUNTER — Other Ambulatory Visit (HOSPITAL_BASED_OUTPATIENT_CLINIC_OR_DEPARTMENT_OTHER): Payer: Self-pay

## 2024-09-23 ENCOUNTER — Other Ambulatory Visit: Payer: Self-pay

## 2024-09-23 DIAGNOSIS — Z6834 Body mass index (BMI) 34.0-34.9, adult: Secondary | ICD-10-CM

## 2024-09-23 DIAGNOSIS — Z1159 Encounter for screening for other viral diseases: Secondary | ICD-10-CM

## 2024-09-27 ENCOUNTER — Other Ambulatory Visit

## 2024-09-27 DIAGNOSIS — E66811 Obesity, class 1: Secondary | ICD-10-CM

## 2024-09-27 DIAGNOSIS — Z1159 Encounter for screening for other viral diseases: Secondary | ICD-10-CM

## 2024-09-28 ENCOUNTER — Other Ambulatory Visit

## 2024-09-28 DIAGNOSIS — Z6834 Body mass index (BMI) 34.0-34.9, adult: Secondary | ICD-10-CM | POA: Diagnosis not present

## 2024-09-28 DIAGNOSIS — Z1159 Encounter for screening for other viral diseases: Secondary | ICD-10-CM | POA: Diagnosis not present

## 2024-09-28 DIAGNOSIS — E66811 Obesity, class 1: Secondary | ICD-10-CM | POA: Diagnosis not present

## 2024-09-29 ENCOUNTER — Ambulatory Visit: Payer: Self-pay

## 2024-09-29 LAB — COMPREHENSIVE METABOLIC PANEL WITH GFR
ALT: 20 IU/L (ref 0–32)
AST: 21 IU/L (ref 0–40)
Albumin: 4.3 g/dL (ref 3.9–4.9)
Alkaline Phosphatase: 59 IU/L (ref 41–116)
BUN/Creatinine Ratio: 13 (ref 9–23)
BUN: 10 mg/dL (ref 6–24)
Bilirubin Total: 0.2 mg/dL (ref 0.0–1.2)
CO2: 23 mmol/L (ref 20–29)
Calcium: 9.4 mg/dL (ref 8.7–10.2)
Chloride: 103 mmol/L (ref 96–106)
Creatinine, Ser: 0.75 mg/dL (ref 0.57–1.00)
Globulin, Total: 2.4 g/dL (ref 1.5–4.5)
Glucose: 86 mg/dL (ref 70–99)
Potassium: 4.4 mmol/L (ref 3.5–5.2)
Sodium: 137 mmol/L (ref 134–144)
Total Protein: 6.7 g/dL (ref 6.0–8.5)
eGFR: 103 mL/min/1.73 (ref >59)

## 2024-09-29 LAB — CBC WITH DIFFERENTIAL/PLATELET
Basophils Absolute: 0 x10E3/uL (ref 0.0–0.2)
Basos: 1 %
EOS (ABSOLUTE): 0.1 x10E3/uL (ref 0.0–0.4)
Eos: 2 %
Hematocrit: 37.4 % (ref 34.0–46.6)
Hemoglobin: 12.3 g/dL (ref 11.1–15.9)
Immature Grans (Abs): 0 x10E3/uL (ref 0.0–0.1)
Immature Granulocytes: 0 %
Lymphocytes Absolute: 1.7 x10E3/uL (ref 0.7–3.1)
Lymphs: 44 %
MCH: 29.7 pg (ref 26.6–33.0)
MCHC: 32.9 g/dL (ref 31.5–35.7)
MCV: 90 fL (ref 79–97)
Monocytes Absolute: 0.2 x10E3/uL (ref 0.1–0.9)
Monocytes: 6 %
Neutrophils Absolute: 1.7 x10E3/uL (ref 1.4–7.0)
Neutrophils: 47 %
Platelets: 298 x10E3/uL (ref 150–450)
RBC: 4.14 x10E6/uL (ref 3.77–5.28)
RDW: 13.6 % (ref 11.7–15.4)
WBC: 3.8 x10E3/uL (ref 3.4–10.8)

## 2024-09-29 LAB — LIPID PANEL
Chol/HDL Ratio: 3.5 ratio (ref 0.0–4.4)
Cholesterol, Total: 179 mg/dL (ref 100–199)
HDL: 51 mg/dL (ref >39)
LDL Chol Calc (NIH): 114 mg/dL — ABNORMAL HIGH (ref 0–99)
Triglycerides: 72 mg/dL (ref 0–149)
VLDL Cholesterol Cal: 14 mg/dL (ref 5–40)

## 2024-09-29 LAB — HEMOGLOBIN A1C
Est. average glucose Bld gHb Est-mCnc: 100 mg/dL
Hgb A1c MFr Bld: 5.1 % (ref 4.8–5.6)

## 2024-09-29 LAB — TSH: TSH: 1.56 u[IU]/mL (ref 0.450–4.500)

## 2024-09-29 LAB — VITAMIN D 25 HYDROXY (VIT D DEFICIENCY, FRACTURES): Vit D, 25-Hydroxy: 27.5 ng/mL — ABNORMAL LOW (ref 30.0–100.0)

## 2024-09-29 LAB — HEPATITIS C ANTIBODY: Hep C Virus Ab: NONREACTIVE

## 2024-10-04 ENCOUNTER — Other Ambulatory Visit (HOSPITAL_BASED_OUTPATIENT_CLINIC_OR_DEPARTMENT_OTHER): Payer: Self-pay

## 2024-10-04 ENCOUNTER — Ambulatory Visit

## 2024-10-04 VITALS — BP 134/88 | HR 80 | Temp 97.8°F | Ht 66.0 in | Wt 209.0 lb

## 2024-10-04 DIAGNOSIS — E66811 Obesity, class 1: Secondary | ICD-10-CM

## 2024-10-04 DIAGNOSIS — Z23 Encounter for immunization: Secondary | ICD-10-CM | POA: Diagnosis not present

## 2024-10-04 DIAGNOSIS — F909 Attention-deficit hyperactivity disorder, unspecified type: Secondary | ICD-10-CM

## 2024-10-04 DIAGNOSIS — Z0000 Encounter for general adult medical examination without abnormal findings: Secondary | ICD-10-CM | POA: Diagnosis not present

## 2024-10-04 DIAGNOSIS — Z6834 Body mass index (BMI) 34.0-34.9, adult: Secondary | ICD-10-CM | POA: Diagnosis not present

## 2024-10-04 DIAGNOSIS — Z124 Encounter for screening for malignant neoplasm of cervix: Secondary | ICD-10-CM

## 2024-10-04 DIAGNOSIS — E559 Vitamin D deficiency, unspecified: Secondary | ICD-10-CM | POA: Insufficient documentation

## 2024-10-04 DIAGNOSIS — G4700 Insomnia, unspecified: Secondary | ICD-10-CM | POA: Diagnosis not present

## 2024-10-04 DIAGNOSIS — Z1231 Encounter for screening mammogram for malignant neoplasm of breast: Secondary | ICD-10-CM

## 2024-10-04 MED ORDER — AMPHETAMINE-DEXTROAMPHET ER 20 MG PO CP24
20.0000 mg | ORAL_CAPSULE | Freq: Every day | ORAL | 0 refills | Status: DC
  Filled 2024-12-27: qty 30, 30d supply, fill #0

## 2024-10-04 MED ORDER — AMPHETAMINE-DEXTROAMPHET ER 20 MG PO CP24
20.0000 mg | ORAL_CAPSULE | Freq: Every day | ORAL | 0 refills | Status: DC
  Filled 2024-10-04 (×2): qty 30, 30d supply, fill #0

## 2024-10-04 MED ORDER — AMPHETAMINE-DEXTROAMPHET ER 20 MG PO CP24
20.0000 mg | ORAL_CAPSULE | Freq: Every day | ORAL | 0 refills | Status: DC
  Filled 2024-11-14: qty 30, 30d supply, fill #0

## 2024-10-04 NOTE — Assessment & Plan Note (Signed)
 Continue Adderall XR 20 mg for ADHD. PDMP reviewed, no aberrancies. Follow up for ADHD in 3 months.

## 2024-10-04 NOTE — Assessment & Plan Note (Signed)
 Discussed dietary habits and importance of protein and fiber. - Encourage dietary modifications focusing on protein and fiber intake. - Recommend tracking protein intake to meet daily goals. - Consider referral to [facility] if this proves ineffective alone

## 2024-10-04 NOTE — Assessment & Plan Note (Signed)
 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 Discussed importance of adequate rest. - Encourage maintaining a consistent sleep schedule when possible.  - Has tried and failed Remeron  due to over-sedation  - May be a good candidate for hydroxyzine if sleep worsens

## 2024-10-04 NOTE — Assessment & Plan Note (Signed)
 Routine visit with satisfactory labs. Cholesterol slightly elevated, triglycerides improved, A1c at 5.1, Vitamin D  slightly low. - Discussed lab results. - Schedule Pap smear. - Order mammogram. - Administer flu shot. - Administer TDAP vaccine.

## 2024-10-04 NOTE — Assessment & Plan Note (Signed)
 Mild deficiency, not requiring prescription supplementation. Discussed potential symptoms. - Recommend over-the-counter vitamin D  supplementation (2000-5000 units daily).

## 2024-10-04 NOTE — Patient Instructions (Signed)
 VISIT SUMMARY: Today, you had your annual physical exam and medication review. We discussed your lab results, which showed slightly elevated cholesterol and low vitamin D  levels. We also addressed your sleep disturbances, and dietary habits.  YOUR PLAN:  DYSLIPIDEMIA: Your cholesterol levels are slightly elevated, likely due to recent dietary habits. -Monitor your cholesterol levels. -Make dietary changes to reduce cholesterol, such as reducing bacon consumption.  VITAMIN D  DEFICIENCY: Your vitamin D  levels are slightly low, likely due to decreased outdoor activity. -Take over-the-counter vitamin D  supplements (2000-5000 units daily).  SLEEP DISTURBANCE: Your sleep patterns are irregular due to your night shift work schedule. -Try to maintain a consistent sleep schedule when possible.  APPETITE AND WEIGHT FLUCTUATION: Your weight fluctuates significantly, possibly due to your work schedule and dietary habits. -Focus on dietary modifications, especially increasing protein and fiber intake. -Track your protein intake to meet daily goals.  GENERAL HEALTH MAINTENANCE: Routine health maintenance and preventive care. -Schedule a Pap smear. -Order a mammogram. -Get a flu shot. -Get a TDAP vaccine.  ATTENTION-DEFICIT HYPERACTIVITY DISORDER: Your ADHD is managed with Adderall 20 mg extended release. -Refill Adderall 20 mg extended release for 3 months. -Take breaks from the medication when possible.  If you have any problems before your next visit feel free to message me via MyChart (minor issues or questions) or call the office, otherwise you may reach out to schedule an office visit.  Thank you! Saddie Sacks, PA-C

## 2024-10-04 NOTE — Progress Notes (Signed)
 Complete physical exam  Patient: Carrie Reed   DOB: [birth year]   38 y.o. Female  MRN: [id number]  Subjective:    Chief Complaint  Patient presents with   Annual Exam    Physical   History of Present Illness   Carrie Reed is a 38 year old female who presents for an annual physical exam and medication review.  Dyslipidemia - Cholesterol levels slightly elevated, attributed to recent increased bacon consumption - LDL slightly elevated, not causing significant concern as previously managed successfully - Triglycerides have been consistently high in the past but have improved significantly  Vitamin d  deficiency - Vitamin D  levels slightly low, attributed to decreased outdoor activity due to classes and other commitments - Reduced time outdoors over the summer, less yard work than usual - No current symptoms of vitamin D  deficiency such as sluggishness or fatigue  Sleep - Irregular sleep patterns due to night shift work schedule - Typically obtains only four hours of sleep during consecutive shifts, compensates by sleeping extensively afterward  Appetite and weight fluctuation - Current medication regimen includes Adderall 20 mg extended release taken intermittently and over-the-counter ibuprofen  as needed for headaches/cramps/etc.  - Significant sweet tooth, especially for birthday cake - Weight fluctuates significantly, attributed to work schedule and dietary habits - Previously lost weight successfully through calorie deficit strategies          Most recent fall risk assessment:    10/04/2024    8:26 AM  Fall Risk   Falls in the past year? 0  Follow up Falls evaluation completed     Most recent depression screenings:    10/04/2024    8:26 AM 06/02/2024    8:19 AM  PHQ 2/9 Scores  PHQ - 2 Score 0 0  PHQ- 9 Score 4 1    Vision:Within last year and Dental: No current dental problems and Receives regular dental care    Patient Care Team: Gayle Saddie JULIANNA DEVONNA  as PCP - General (Physician Assistant)   Outpatient Medications Prior to Visit  Medication Sig   ibuprofen  (ADVIL ) 800 MG tablet Take 1 tablet (800 mg total) by mouth 3 (three) times daily.   [DISCONTINUED] amphetamine -dextroamphetamine  (ADDERALL XR) 20 MG 24 hr capsule Take 1 capsule (20 mg total) by mouth daily.   [DISCONTINUED] amphetamine -dextroamphetamine  (ADDERALL XR) 20 MG 24 hr capsule Take 1 capsule (20 mg total) by mouth daily.   [DISCONTINUED] amphetamine -dextroamphetamine  (ADDERALL XR) 20 MG 24 hr capsule Take 1 capsule (20 mg total) by mouth daily.   No facility-administered medications prior to visit.    ROS  Per HPI      Objective:     BP 134/88   Pulse 80   Temp 97.8 F (36.6 C) (Oral)   Ht 5' 6 (1.676 m)   Wt 209 lb 0.6 oz (94.8 kg)   SpO2 99%   BMI 33.74 kg/m    Physical Exam Constitutional:      General: She is not in acute distress.    Appearance: Normal appearance.  Eyes:     Pupils: Pupils are equal, round, and reactive to light.  Cardiovascular:     Rate and Rhythm: Normal rate and regular rhythm.     Heart sounds: Normal heart sounds. No murmur heard.    No friction rub. No gallop.  Pulmonary:     Effort: Pulmonary effort is normal. No respiratory distress.     Breath sounds: Normal breath sounds.  Abdominal:     General:  Bowel sounds are normal.  Musculoskeletal:        General: No swelling.     Cervical back: Neck supple.  Lymphadenopathy:     Cervical: No cervical adenopathy.  Skin:    General: Skin is warm and dry.  Neurological:     General: No focal deficit present.     Mental Status: She is alert.  Psychiatric:        Mood and Affect: Mood normal.        Behavior: Behavior normal.        Thought Content: Thought content normal.       No results found for any visits on 10/04/24. Last CBC Lab Results  Component Value Date   WBC 3.8 09/28/2024   HGB 12.3 09/28/2024   HCT 37.4 09/28/2024   MCV 90 09/28/2024   MCH  29.7 09/28/2024   RDW 13.6 09/28/2024   PLT 298 09/28/2024   Last metabolic panel Lab Results  Component Value Date   GLUCOSE 86 09/28/2024   NA 137 09/28/2024   K 4.4 09/28/2024   CL 103 09/28/2024   CO2 23 09/28/2024   BUN 10 09/28/2024   CREATININE 0.75 09/28/2024   EGFR 103 09/28/2024   CALCIUM 9.4 09/28/2024   PROT 6.7 09/28/2024   ALBUMIN 4.3 09/28/2024   LABGLOB 2.4 09/28/2024   AGRATIO 1.8 10/20/2022   BILITOT 0.2 09/28/2024   ALKPHOS 59 09/28/2024   AST 21 09/28/2024   ALT 20 09/28/2024   ANIONGAP 17 (H) 08/02/2023   Last lipids Lab Results  Component Value Date   CHOL 179 09/28/2024   HDL 51 09/28/2024   LDLCALC 114 (H) 09/28/2024   TRIG 72 09/28/2024   CHOLHDL 3.5 09/28/2024   Last hemoglobin A1c Lab Results  Component Value Date   HGBA1C 5.1 09/28/2024   Last thyroid  functions Lab Results  Component Value Date   TSH 1.560 09/28/2024   T4TOTAL 6.0 01/04/2016   Last vitamin D  Lab Results  Component Value Date   VD25OH 27.5 (L) 09/28/2024        Assessment & Plan:    Routine Health Maintenance and Physical Exam  Health Maintenance  Topic Date Due   COVID-19 Vaccine (1) Never done   Hepatitis B Vaccine (1 of 3 - 19+ 3-dose series) Never done   HPV Vaccine (1 - Risk 3-dose SCDM series) Never done   Pap with HPV screening  09/20/2017   Breast Cancer Screening  06/11/2024   DTaP/Tdap/Td vaccine (2 - Td or Tdap) 10/04/2034   Flu Shot  Completed   Hepatitis C Screening  Completed   HIV Screening  Completed   Pneumococcal Vaccine  Aged Out   Meningitis B Vaccine  Aged Out    Discussed health benefits of physical activity, and encouraged her to engage in regular exercise appropriate for her age and condition.  Cervical cancer screening -     Flu vaccine trivalent PF, 6mos and older(Flulaval ,Afluria,Fluarix,Fluzone)  Encounter for vaccination -     Flu vaccine trivalent PF, 6mos and older(Flulaval ,Afluria,Fluarix,Fluzone) -     Tdap  vaccine greater than or equal to 7yo IM  Screening mammogram for breast cancer -     3D Screening Mammogram, Left and Right; Future  Attention deficit hyperactivity disorder (ADHD), unspecified ADHD type Assessment & Plan: Continue Adderall XR 20 mg for ADHD. PDMP reviewed, no aberrancies. Follow up for ADHD in 3 months.  Orders: -     Amphetamine -Dextroamphet ER; Take 1 capsule (20  mg total) by mouth daily.  Dispense: 30 capsule; Refill: 0 -     Amphetamine -Dextroamphet ER; Take 1 capsule (20 mg total) by mouth daily.  Dispense: 30 capsule; Refill: 0 -     Amphetamine -Dextroamphet ER; Take 1 capsule (20 mg total) by mouth daily.  Dispense: 30 capsule; Refill: 0  Vitamin D  insufficiency Assessment & Plan: Mild deficiency, not requiring prescription supplementation. Discussed potential symptoms. - Recommend over-the-counter vitamin D  supplementation (2000-5000 units daily).   Insomnia, unspecified type Assessment & Plan: Sleep patterns disrupted due to shift work. Discussed importance of adequate rest. - Encourage maintaining a consistent sleep schedule when possible.  - Has tried and failed Remeron  due to over-sedation  - May be a good candidate for hydroxyzine if sleep worsens    Class 1 obesity without serious comorbidity with body mass index (BMI) of 34.0 to 34.9 in adult, unspecified obesity type Assessment & Plan: Discussed dietary habits and importance of protein and fiber. - Encourage dietary modifications focusing on protein and fiber intake. - Recommend tracking protein intake to meet daily goals. - Consider referral to [facility] if this proves ineffective alone   Wellness examination Assessment & Plan: Routine visit with satisfactory labs. Cholesterol slightly elevated, triglycerides improved, A1c at 5.1, Vitamin D  slightly low. - Discussed lab results. - Schedule Pap smear. - Order mammogram. - Administer flu shot. - Administer TDAP  vaccine.     Return in about 3 months (around 01/04/2025) for ADHD, Pap smear.     Saddie JULIANNA Sacks, PA-C

## 2024-11-10 ENCOUNTER — Ambulatory Visit

## 2024-11-14 ENCOUNTER — Other Ambulatory Visit: Payer: Self-pay

## 2024-11-14 ENCOUNTER — Other Ambulatory Visit (HOSPITAL_BASED_OUTPATIENT_CLINIC_OR_DEPARTMENT_OTHER): Payer: Self-pay

## 2024-11-16 ENCOUNTER — Other Ambulatory Visit (HOSPITAL_BASED_OUTPATIENT_CLINIC_OR_DEPARTMENT_OTHER): Payer: Self-pay

## 2024-11-17 ENCOUNTER — Ambulatory Visit

## 2024-12-02 ENCOUNTER — Ambulatory Visit

## 2024-12-27 ENCOUNTER — Other Ambulatory Visit (HOSPITAL_BASED_OUTPATIENT_CLINIC_OR_DEPARTMENT_OTHER): Payer: Self-pay

## 2025-01-04 ENCOUNTER — Ambulatory Visit

## 2025-01-04 ENCOUNTER — Other Ambulatory Visit (HOSPITAL_BASED_OUTPATIENT_CLINIC_OR_DEPARTMENT_OTHER): Payer: Self-pay

## 2025-01-04 VITALS — BP 127/88 | HR 81 | Temp 98.1°F | Ht 66.0 in | Wt 206.1 lb

## 2025-01-04 DIAGNOSIS — R030 Elevated blood-pressure reading, without diagnosis of hypertension: Secondary | ICD-10-CM | POA: Diagnosis not present

## 2025-01-04 DIAGNOSIS — F909 Attention-deficit hyperactivity disorder, unspecified type: Secondary | ICD-10-CM

## 2025-01-04 MED ORDER — AMPHETAMINE-DEXTROAMPHET ER 20 MG PO CP24: 20.0000 mg | ORAL_CAPSULE | Freq: Every day | ORAL | 0 refills | Status: AC

## 2025-01-04 MED ORDER — AMPHETAMINE-DEXTROAMPHET ER 20 MG PO CP24
20.0000 mg | ORAL_CAPSULE | Freq: Every day | ORAL | 0 refills | Status: AC
  Filled 2025-01-04: qty 30, 30d supply, fill #0

## 2025-01-04 NOTE — Assessment & Plan Note (Signed)
 BP mildly elevated at 133/92, improved on recheck.  Reports hand swelling and puffiness. Discussed hydrochlorothiazide  for symptoms and blood pressure control. - Monitor blood pressure regularly, especially at work. - Consider hydrochlorothiazide  if symptoms/elevated readings persist.

## 2025-01-04 NOTE — Assessment & Plan Note (Signed)
 Stable and controlled. Continue Adderall XR 20 mg for ADHD. PDMP reviewed, no aberrancies. Follow up for ADHD in 3 months.

## 2025-01-04 NOTE — Progress Notes (Signed)
 "  Established Patient Office Visit  Subjective   Patient ID: Carrie Reed, female    DOB: 02/15/1986  Age: 39 y.o. MRN: [id number]  Chief Complaint  Patient presents with   Medication Management    HPI  History of Present Illness   Carrie Reed is a 39 year old female who presents for a follow-up visit and medication refill.  Attention deficit hyperactivity disorder management - Currently taking Adderall with continued efficacy for symptom control - No adverse effects from Adderall  Blood pressure and vascular symptoms - Blood pressure typically in the 130s/80s both in clinic and at home - Elevated blood pressure more pronounced when fatigued after working night shift  - Experiences hand puffiness especially at the end of the day - Manages vascular symptoms with rest and elevation and they improve  - Denies HA, SOB, CP, vision changes  Menstrual abnormalities - Menstrual period began last night - Experiencing very heavy menstrual bleeding  Preventive health maintenance - Mammogram previously ordered but not completed - Plans to reschedule mammogram when schedule allows, as location requires travel to [facility]          ROS Per HPI.    Objective:     BP 127/88   Pulse 81   Temp 98.1 F (36.7 C) (Oral)   Ht 5' 6 (1.676 m)   Wt 206 lb 1.3 oz (93.5 kg)   LMP 01/03/2025   SpO2 99%   BMI 33.26 kg/m    Physical Exam Constitutional:      General: She is not in acute distress.    Appearance: Normal appearance.  Cardiovascular:     Rate and Rhythm: Normal rate and regular rhythm.     Heart sounds: Normal heart sounds. No murmur heard.    No friction rub. No gallop.  Pulmonary:     Effort: Pulmonary effort is normal. No respiratory distress.     Breath sounds: Normal breath sounds.  Musculoskeletal:        General: No swelling.  Skin:    General: Skin is warm and dry.  Neurological:     General: No focal deficit present.     Mental Status: She is  alert.  Psychiatric:        Mood and Affect: Mood normal.        Behavior: Behavior normal.        Thought Content: Thought content normal.      No results found for any visits on 01/04/25.    The 10-year ASCVD risk score (Arnett DK, et al., 2019) is: 0.5%    Assessment & Plan:   Attention deficit hyperactivity disorder (ADHD), unspecified ADHD type Assessment & Plan: Stable and controlled. Continue Adderall XR 20 mg for ADHD. PDMP reviewed, no aberrancies. Follow up for ADHD in 3 months.   Orders: -     Amphetamine -Dextroamphet ER; Take 1 capsule (20 mg total) by mouth daily.  Dispense: 30 capsule; Refill: 0 -     Amphetamine -Dextroamphet ER; Take 1 capsule (20 mg total) by mouth daily.  Dispense: 30 capsule; Refill: 0 -     Amphetamine -Dextroamphet ER; Take 1 capsule (20 mg total) by mouth daily.  Dispense: 30 capsule; Refill: 0  Elevated BP without diagnosis of hypertension Assessment & Plan: BP mildly elevated at 133/92, improved on recheck.  Reports hand swelling and puffiness. Discussed hydrochlorothiazide  for symptoms and blood pressure control. - Monitor blood pressure regularly, especially at work. - Consider hydrochlorothiazide  if symptoms/elevated readings persist.   Encounter  for pap smear - Postponed to next OV due to patient starting her menstrual cycle today with heavy flow  Return in about 11 weeks (around 03/22/2025) for ADHD/Pap smear.    Saddie JULIANNA Sacks, PA-C "

## 2025-01-04 NOTE — Patient Instructions (Signed)
 VISIT SUMMARY: Carrie Reed is a 39 year old female who came in for a follow-up visit and medication refill. We discussed her ADHD management, blood pressure, vascular symptoms, and menstrual abnormalities. Preventive health maintenance was also reviewed.  YOUR PLAN: ATTENTION-DEFICIT HYPERACTIVITY DISORDER: Your ADHD is well-managed with Adderall and you are not experiencing any side effects. -Continue taking Adderall as prescribed. -I have sent refills for Adderall.  ESSENTIAL HYPERTENSION: Your blood pressure is elevated at 130/90 mmHg, and you have reported hand swelling and puffiness. -Monitor your blood pressure regularly, especially at work. -Consider taking hydrochlorothiazide  if your symptoms persist.  GENERAL HEALTH MAINTENANCE: Your Pap smear was deferred due to heavy menstrual bleeding, and you need to reschedule your mammogram. -Schedule a Pap smear at your next follow-up visit. -Reschedule your mammogram appointment when your schedule allows.  If you have any problems before your next visit feel free to message me via MyChart (minor issues or questions) or call the office, otherwise you may reach out to schedule an office visit.  Thank you! Saddie Sacks, PA-C

## 2025-03-28 ENCOUNTER — Ambulatory Visit

## 2025-06-06 ENCOUNTER — Ambulatory Visit: Admitting: Physician Assistant
